# Patient Record
Sex: Female | Born: 1935 | Race: White | Hispanic: No | Smoking: Former smoker
Health system: Southern US, Community
[De-identification: ages and names within clinical notes are randomized; demographics above are authoritative.]

## PROBLEM LIST (undated history)

## (undated) DIAGNOSIS — E7800 Pure hypercholesterolemia, unspecified: Secondary | ICD-10-CM

## (undated) DIAGNOSIS — M810 Age-related osteoporosis without current pathological fracture: Secondary | ICD-10-CM

## (undated) DIAGNOSIS — J449 Chronic obstructive pulmonary disease, unspecified: Secondary | ICD-10-CM

## (undated) DIAGNOSIS — G250 Essential tremor: Secondary | ICD-10-CM

## (undated) DIAGNOSIS — I2510 Atherosclerotic heart disease of native coronary artery without angina pectoris: Secondary | ICD-10-CM

## (undated) DIAGNOSIS — N200 Calculus of kidney: Secondary | ICD-10-CM

## (undated) DIAGNOSIS — G459 Transient cerebral ischemic attack, unspecified: Secondary | ICD-10-CM

## (undated) DIAGNOSIS — E876 Hypokalemia: Secondary | ICD-10-CM

## (undated) DIAGNOSIS — I480 Paroxysmal atrial fibrillation: Secondary | ICD-10-CM

## (undated) DIAGNOSIS — R5381 Other malaise: Secondary | ICD-10-CM

## (undated) DIAGNOSIS — I10 Essential (primary) hypertension: Secondary | ICD-10-CM

## (undated) DIAGNOSIS — K118 Other diseases of salivary glands: Secondary | ICD-10-CM

## (undated) DIAGNOSIS — K760 Fatty (change of) liver, not elsewhere classified: Secondary | ICD-10-CM

## (undated) DIAGNOSIS — I5030 Unspecified diastolic (congestive) heart failure: Secondary | ICD-10-CM

## (undated) DIAGNOSIS — G4700 Insomnia, unspecified: Secondary | ICD-10-CM

## (undated) DIAGNOSIS — M109 Gout, unspecified: Secondary | ICD-10-CM

## (undated) DIAGNOSIS — K219 Gastro-esophageal reflux disease without esophagitis: Secondary | ICD-10-CM

## (undated) DIAGNOSIS — S32000A Wedge compression fracture of unspecified lumbar vertebra, initial encounter for closed fracture: Secondary | ICD-10-CM

## (undated) DIAGNOSIS — K5790 Diverticulosis of intestine, part unspecified, without perforation or abscess without bleeding: Secondary | ICD-10-CM

## (undated) DIAGNOSIS — J45909 Unspecified asthma, uncomplicated: Secondary | ICD-10-CM

## (undated) HISTORY — PX: CATARACT EXTRACTION: SUR2

## (undated) HISTORY — DX: Gout, unspecified: M10.9

## (undated) HISTORY — DX: Insomnia, unspecified: G47.00

## (undated) HISTORY — DX: Calculus of kidney: N20.0

## (undated) HISTORY — DX: Other diseases of salivary glands: K11.8

## (undated) HISTORY — DX: Chronic obstructive pulmonary disease, unspecified: J44.9

## (undated) HISTORY — DX: Diverticulosis of intestine, part unspecified, without perforation or abscess without bleeding: K57.90

## (undated) HISTORY — DX: Transient cerebral ischemic attack, unspecified: G45.9

## (undated) HISTORY — DX: Paroxysmal atrial fibrillation: I48.0

## (undated) HISTORY — DX: Essential tremor: G25.0

## (undated) HISTORY — DX: Wedge compression fracture of unspecified lumbar vertebra, initial encounter for closed fracture: S32.000A

## (undated) HISTORY — DX: Pure hypercholesterolemia, unspecified: E78.00

## (undated) HISTORY — DX: Age-related osteoporosis without current pathological fracture: M81.0

## (undated) HISTORY — DX: Fatty (change of) liver, not elsewhere classified: K76.0

## (undated) HISTORY — DX: Unspecified asthma, uncomplicated: J45.909

## (undated) HISTORY — DX: Other malaise: R53.81

## (undated) HISTORY — DX: Hypokalemia: E87.6

## (undated) HISTORY — PX: OTHER SURGICAL HISTORY: SHX169

## (undated) HISTORY — DX: Atherosclerotic heart disease of native coronary artery without angina pectoris: I25.10

## (undated) HISTORY — PX: TONSILLECTOMY AND ADENOIDECTOMY: SUR1326

## (undated) HISTORY — DX: Essential (primary) hypertension: I10

## (undated) HISTORY — DX: Unspecified diastolic (congestive) heart failure: I50.30

---

## 2011-08-31 DIAGNOSIS — I10 Essential (primary) hypertension: Secondary | ICD-10-CM | POA: Diagnosis not present

## 2011-08-31 DIAGNOSIS — M1990 Unspecified osteoarthritis, unspecified site: Secondary | ICD-10-CM | POA: Diagnosis not present

## 2011-08-31 DIAGNOSIS — I2510 Atherosclerotic heart disease of native coronary artery without angina pectoris: Secondary | ICD-10-CM | POA: Diagnosis not present

## 2011-08-31 DIAGNOSIS — M109 Gout, unspecified: Secondary | ICD-10-CM | POA: Diagnosis not present

## 2011-08-31 DIAGNOSIS — E7800 Pure hypercholesterolemia, unspecified: Secondary | ICD-10-CM | POA: Diagnosis not present

## 2011-09-27 DIAGNOSIS — I2510 Atherosclerotic heart disease of native coronary artery without angina pectoris: Secondary | ICD-10-CM | POA: Diagnosis not present

## 2011-09-27 DIAGNOSIS — E785 Hyperlipidemia, unspecified: Secondary | ICD-10-CM | POA: Diagnosis not present

## 2011-11-07 DIAGNOSIS — R319 Hematuria, unspecified: Secondary | ICD-10-CM | POA: Diagnosis not present

## 2011-11-15 DIAGNOSIS — N200 Calculus of kidney: Secondary | ICD-10-CM | POA: Diagnosis not present

## 2012-03-04 DIAGNOSIS — I2510 Atherosclerotic heart disease of native coronary artery without angina pectoris: Secondary | ICD-10-CM | POA: Diagnosis not present

## 2012-03-04 DIAGNOSIS — E7800 Pure hypercholesterolemia, unspecified: Secondary | ICD-10-CM | POA: Diagnosis not present

## 2012-03-04 DIAGNOSIS — I10 Essential (primary) hypertension: Secondary | ICD-10-CM | POA: Diagnosis not present

## 2012-03-22 DIAGNOSIS — Z1231 Encounter for screening mammogram for malignant neoplasm of breast: Secondary | ICD-10-CM | POA: Diagnosis not present

## 2012-09-03 DIAGNOSIS — M810 Age-related osteoporosis without current pathological fracture: Secondary | ICD-10-CM | POA: Diagnosis not present

## 2012-09-03 DIAGNOSIS — Z1331 Encounter for screening for depression: Secondary | ICD-10-CM | POA: Diagnosis not present

## 2012-09-03 DIAGNOSIS — R5381 Other malaise: Secondary | ICD-10-CM | POA: Diagnosis not present

## 2012-09-03 DIAGNOSIS — Z9181 History of falling: Secondary | ICD-10-CM | POA: Diagnosis not present

## 2012-09-03 DIAGNOSIS — I10 Essential (primary) hypertension: Secondary | ICD-10-CM | POA: Diagnosis not present

## 2012-09-03 DIAGNOSIS — I2510 Atherosclerotic heart disease of native coronary artery without angina pectoris: Secondary | ICD-10-CM | POA: Diagnosis not present

## 2012-09-03 DIAGNOSIS — E7800 Pure hypercholesterolemia, unspecified: Secondary | ICD-10-CM | POA: Diagnosis not present

## 2012-10-16 DIAGNOSIS — Z79899 Other long term (current) drug therapy: Secondary | ICD-10-CM | POA: Diagnosis not present

## 2012-10-16 DIAGNOSIS — I2510 Atherosclerotic heart disease of native coronary artery without angina pectoris: Secondary | ICD-10-CM | POA: Diagnosis not present

## 2012-10-16 DIAGNOSIS — E785 Hyperlipidemia, unspecified: Secondary | ICD-10-CM | POA: Diagnosis not present

## 2012-11-15 DIAGNOSIS — N200 Calculus of kidney: Secondary | ICD-10-CM | POA: Diagnosis not present

## 2012-11-15 DIAGNOSIS — R1032 Left lower quadrant pain: Secondary | ICD-10-CM | POA: Diagnosis not present

## 2013-01-21 DIAGNOSIS — R1032 Left lower quadrant pain: Secondary | ICD-10-CM | POA: Diagnosis not present

## 2013-03-05 DIAGNOSIS — I2510 Atherosclerotic heart disease of native coronary artery without angina pectoris: Secondary | ICD-10-CM | POA: Diagnosis not present

## 2013-03-05 DIAGNOSIS — M810 Age-related osteoporosis without current pathological fracture: Secondary | ICD-10-CM | POA: Diagnosis not present

## 2013-03-05 DIAGNOSIS — Z0000 Encounter for general adult medical examination without abnormal findings: Secondary | ICD-10-CM | POA: Diagnosis not present

## 2013-03-05 DIAGNOSIS — I10 Essential (primary) hypertension: Secondary | ICD-10-CM | POA: Diagnosis not present

## 2013-03-05 DIAGNOSIS — Z79899 Other long term (current) drug therapy: Secondary | ICD-10-CM | POA: Diagnosis not present

## 2013-03-05 DIAGNOSIS — E7800 Pure hypercholesterolemia, unspecified: Secondary | ICD-10-CM | POA: Diagnosis not present

## 2013-03-28 DIAGNOSIS — K7689 Other specified diseases of liver: Secondary | ICD-10-CM | POA: Diagnosis not present

## 2013-03-28 DIAGNOSIS — Z1231 Encounter for screening mammogram for malignant neoplasm of breast: Secondary | ICD-10-CM | POA: Diagnosis not present

## 2013-03-28 DIAGNOSIS — R1032 Left lower quadrant pain: Secondary | ICD-10-CM | POA: Diagnosis not present

## 2013-05-23 DIAGNOSIS — K5289 Other specified noninfective gastroenteritis and colitis: Secondary | ICD-10-CM | POA: Diagnosis not present

## 2013-05-23 DIAGNOSIS — N289 Disorder of kidney and ureter, unspecified: Secondary | ICD-10-CM | POA: Diagnosis not present

## 2013-05-23 DIAGNOSIS — IMO0002 Reserved for concepts with insufficient information to code with codable children: Secondary | ICD-10-CM | POA: Diagnosis not present

## 2013-06-17 DIAGNOSIS — M48061 Spinal stenosis, lumbar region without neurogenic claudication: Secondary | ICD-10-CM | POA: Diagnosis not present

## 2013-06-17 DIAGNOSIS — M5137 Other intervertebral disc degeneration, lumbosacral region: Secondary | ICD-10-CM | POA: Diagnosis not present

## 2013-06-17 DIAGNOSIS — K7689 Other specified diseases of liver: Secondary | ICD-10-CM | POA: Diagnosis not present

## 2013-06-17 DIAGNOSIS — M5126 Other intervertebral disc displacement, lumbar region: Secondary | ICD-10-CM | POA: Diagnosis not present

## 2013-06-17 DIAGNOSIS — IMO0002 Reserved for concepts with insufficient information to code with codable children: Secondary | ICD-10-CM | POA: Diagnosis not present

## 2013-06-17 DIAGNOSIS — N289 Disorder of kidney and ureter, unspecified: Secondary | ICD-10-CM | POA: Diagnosis not present

## 2013-06-17 DIAGNOSIS — M47817 Spondylosis without myelopathy or radiculopathy, lumbosacral region: Secondary | ICD-10-CM | POA: Diagnosis not present

## 2013-07-10 DIAGNOSIS — N289 Disorder of kidney and ureter, unspecified: Secondary | ICD-10-CM | POA: Diagnosis not present

## 2013-07-10 DIAGNOSIS — E7800 Pure hypercholesterolemia, unspecified: Secondary | ICD-10-CM | POA: Diagnosis not present

## 2013-07-10 DIAGNOSIS — I2510 Atherosclerotic heart disease of native coronary artery without angina pectoris: Secondary | ICD-10-CM | POA: Diagnosis not present

## 2013-07-10 DIAGNOSIS — Z79899 Other long term (current) drug therapy: Secondary | ICD-10-CM | POA: Diagnosis not present

## 2013-07-10 DIAGNOSIS — I10 Essential (primary) hypertension: Secondary | ICD-10-CM | POA: Diagnosis not present

## 2013-07-10 DIAGNOSIS — IMO0002 Reserved for concepts with insufficient information to code with codable children: Secondary | ICD-10-CM | POA: Diagnosis not present

## 2013-07-14 DIAGNOSIS — IMO0002 Reserved for concepts with insufficient information to code with codable children: Secondary | ICD-10-CM | POA: Diagnosis not present

## 2013-07-14 DIAGNOSIS — M999 Biomechanical lesion, unspecified: Secondary | ICD-10-CM | POA: Diagnosis not present

## 2013-07-17 DIAGNOSIS — IMO0002 Reserved for concepts with insufficient information to code with codable children: Secondary | ICD-10-CM | POA: Diagnosis not present

## 2013-07-17 DIAGNOSIS — M999 Biomechanical lesion, unspecified: Secondary | ICD-10-CM | POA: Diagnosis not present

## 2013-07-22 DIAGNOSIS — M999 Biomechanical lesion, unspecified: Secondary | ICD-10-CM | POA: Diagnosis not present

## 2013-07-22 DIAGNOSIS — IMO0002 Reserved for concepts with insufficient information to code with codable children: Secondary | ICD-10-CM | POA: Diagnosis not present

## 2013-07-24 DIAGNOSIS — M999 Biomechanical lesion, unspecified: Secondary | ICD-10-CM | POA: Diagnosis not present

## 2013-07-24 DIAGNOSIS — IMO0002 Reserved for concepts with insufficient information to code with codable children: Secondary | ICD-10-CM | POA: Diagnosis not present

## 2013-07-25 DIAGNOSIS — H2510 Age-related nuclear cataract, unspecified eye: Secondary | ICD-10-CM | POA: Diagnosis not present

## 2013-07-29 DIAGNOSIS — IMO0002 Reserved for concepts with insufficient information to code with codable children: Secondary | ICD-10-CM | POA: Diagnosis not present

## 2013-07-29 DIAGNOSIS — M999 Biomechanical lesion, unspecified: Secondary | ICD-10-CM | POA: Diagnosis not present

## 2013-07-31 DIAGNOSIS — IMO0002 Reserved for concepts with insufficient information to code with codable children: Secondary | ICD-10-CM | POA: Diagnosis not present

## 2013-07-31 DIAGNOSIS — M999 Biomechanical lesion, unspecified: Secondary | ICD-10-CM | POA: Diagnosis not present

## 2013-08-05 DIAGNOSIS — IMO0002 Reserved for concepts with insufficient information to code with codable children: Secondary | ICD-10-CM | POA: Diagnosis not present

## 2013-08-05 DIAGNOSIS — M999 Biomechanical lesion, unspecified: Secondary | ICD-10-CM | POA: Diagnosis not present

## 2013-08-07 DIAGNOSIS — IMO0002 Reserved for concepts with insufficient information to code with codable children: Secondary | ICD-10-CM | POA: Diagnosis not present

## 2013-08-07 DIAGNOSIS — M999 Biomechanical lesion, unspecified: Secondary | ICD-10-CM | POA: Diagnosis not present

## 2013-08-11 DIAGNOSIS — IMO0002 Reserved for concepts with insufficient information to code with codable children: Secondary | ICD-10-CM | POA: Diagnosis not present

## 2013-08-11 DIAGNOSIS — M999 Biomechanical lesion, unspecified: Secondary | ICD-10-CM | POA: Diagnosis not present

## 2013-08-14 DIAGNOSIS — M999 Biomechanical lesion, unspecified: Secondary | ICD-10-CM | POA: Diagnosis not present

## 2013-08-14 DIAGNOSIS — IMO0002 Reserved for concepts with insufficient information to code with codable children: Secondary | ICD-10-CM | POA: Diagnosis not present

## 2013-08-19 DIAGNOSIS — M999 Biomechanical lesion, unspecified: Secondary | ICD-10-CM | POA: Diagnosis not present

## 2013-08-19 DIAGNOSIS — IMO0002 Reserved for concepts with insufficient information to code with codable children: Secondary | ICD-10-CM | POA: Diagnosis not present

## 2013-08-21 DIAGNOSIS — IMO0002 Reserved for concepts with insufficient information to code with codable children: Secondary | ICD-10-CM | POA: Diagnosis not present

## 2013-08-21 DIAGNOSIS — M999 Biomechanical lesion, unspecified: Secondary | ICD-10-CM | POA: Diagnosis not present

## 2013-08-26 DIAGNOSIS — IMO0002 Reserved for concepts with insufficient information to code with codable children: Secondary | ICD-10-CM | POA: Diagnosis not present

## 2013-08-26 DIAGNOSIS — M999 Biomechanical lesion, unspecified: Secondary | ICD-10-CM | POA: Diagnosis not present

## 2013-08-28 DIAGNOSIS — M999 Biomechanical lesion, unspecified: Secondary | ICD-10-CM | POA: Diagnosis not present

## 2013-08-28 DIAGNOSIS — IMO0002 Reserved for concepts with insufficient information to code with codable children: Secondary | ICD-10-CM | POA: Diagnosis not present

## 2013-09-03 DIAGNOSIS — R748 Abnormal levels of other serum enzymes: Secondary | ICD-10-CM | POA: Diagnosis not present

## 2013-09-03 DIAGNOSIS — N289 Disorder of kidney and ureter, unspecified: Secondary | ICD-10-CM | POA: Diagnosis not present

## 2013-09-03 DIAGNOSIS — I2510 Atherosclerotic heart disease of native coronary artery without angina pectoris: Secondary | ICD-10-CM | POA: Diagnosis not present

## 2013-09-03 DIAGNOSIS — IMO0002 Reserved for concepts with insufficient information to code with codable children: Secondary | ICD-10-CM | POA: Diagnosis not present

## 2013-09-23 DIAGNOSIS — M999 Biomechanical lesion, unspecified: Secondary | ICD-10-CM | POA: Diagnosis not present

## 2013-09-23 DIAGNOSIS — IMO0002 Reserved for concepts with insufficient information to code with codable children: Secondary | ICD-10-CM | POA: Diagnosis not present

## 2013-10-06 DIAGNOSIS — IMO0002 Reserved for concepts with insufficient information to code with codable children: Secondary | ICD-10-CM | POA: Diagnosis not present

## 2013-10-06 DIAGNOSIS — M999 Biomechanical lesion, unspecified: Secondary | ICD-10-CM | POA: Diagnosis not present

## 2013-10-16 DIAGNOSIS — M25579 Pain in unspecified ankle and joints of unspecified foot: Secondary | ICD-10-CM | POA: Diagnosis not present

## 2013-10-16 DIAGNOSIS — S8990XA Unspecified injury of unspecified lower leg, initial encounter: Secondary | ICD-10-CM | POA: Diagnosis not present

## 2013-10-16 DIAGNOSIS — M7730 Calcaneal spur, unspecified foot: Secondary | ICD-10-CM | POA: Diagnosis not present

## 2013-10-16 DIAGNOSIS — M19079 Primary osteoarthritis, unspecified ankle and foot: Secondary | ICD-10-CM | POA: Diagnosis not present

## 2013-10-16 DIAGNOSIS — Z87891 Personal history of nicotine dependence: Secondary | ICD-10-CM | POA: Diagnosis not present

## 2013-10-16 DIAGNOSIS — M7989 Other specified soft tissue disorders: Secondary | ICD-10-CM | POA: Diagnosis not present

## 2013-10-16 DIAGNOSIS — I10 Essential (primary) hypertension: Secondary | ICD-10-CM | POA: Diagnosis not present

## 2013-10-16 DIAGNOSIS — M2010 Hallux valgus (acquired), unspecified foot: Secondary | ICD-10-CM | POA: Diagnosis not present

## 2013-10-16 DIAGNOSIS — S99919A Unspecified injury of unspecified ankle, initial encounter: Secondary | ICD-10-CM | POA: Diagnosis not present

## 2013-10-16 DIAGNOSIS — S92309A Fracture of unspecified metatarsal bone(s), unspecified foot, initial encounter for closed fracture: Secondary | ICD-10-CM | POA: Diagnosis not present

## 2013-10-16 DIAGNOSIS — I2510 Atherosclerotic heart disease of native coronary artery without angina pectoris: Secondary | ICD-10-CM | POA: Diagnosis not present

## 2013-10-20 DIAGNOSIS — S92909A Unspecified fracture of unspecified foot, initial encounter for closed fracture: Secondary | ICD-10-CM | POA: Diagnosis not present

## 2013-10-22 DIAGNOSIS — S92309A Fracture of unspecified metatarsal bone(s), unspecified foot, initial encounter for closed fracture: Secondary | ICD-10-CM | POA: Diagnosis not present

## 2013-11-19 DIAGNOSIS — S92309A Fracture of unspecified metatarsal bone(s), unspecified foot, initial encounter for closed fracture: Secondary | ICD-10-CM | POA: Diagnosis not present

## 2013-12-31 DIAGNOSIS — S92335D Nondisplaced fracture of third metatarsal bone, left foot, subsequent encounter for fracture with routine healing: Secondary | ICD-10-CM | POA: Diagnosis not present

## 2013-12-31 DIAGNOSIS — M25571 Pain in right ankle and joints of right foot: Secondary | ICD-10-CM | POA: Diagnosis not present

## 2013-12-31 DIAGNOSIS — S92343D Displaced fracture of fourth metatarsal bone, unspecified foot, subsequent encounter for fracture with routine healing: Secondary | ICD-10-CM | POA: Diagnosis not present

## 2014-03-05 DIAGNOSIS — Z1389 Encounter for screening for other disorder: Secondary | ICD-10-CM | POA: Diagnosis not present

## 2014-03-05 DIAGNOSIS — Z9181 History of falling: Secondary | ICD-10-CM | POA: Diagnosis not present

## 2014-03-05 DIAGNOSIS — I2510 Atherosclerotic heart disease of native coronary artery without angina pectoris: Secondary | ICD-10-CM | POA: Diagnosis not present

## 2014-03-05 DIAGNOSIS — M109 Gout, unspecified: Secondary | ICD-10-CM | POA: Diagnosis not present

## 2014-03-05 DIAGNOSIS — E780 Pure hypercholesterolemia: Secondary | ICD-10-CM | POA: Diagnosis not present

## 2014-03-05 DIAGNOSIS — I10 Essential (primary) hypertension: Secondary | ICD-10-CM | POA: Diagnosis not present

## 2014-03-05 DIAGNOSIS — Z0000 Encounter for general adult medical examination without abnormal findings: Secondary | ICD-10-CM | POA: Diagnosis not present

## 2014-04-01 DIAGNOSIS — J189 Pneumonia, unspecified organism: Secondary | ICD-10-CM | POA: Diagnosis not present

## 2014-04-01 DIAGNOSIS — L989 Disorder of the skin and subcutaneous tissue, unspecified: Secondary | ICD-10-CM | POA: Diagnosis not present

## 2014-04-01 DIAGNOSIS — Z6830 Body mass index (BMI) 30.0-30.9, adult: Secondary | ICD-10-CM | POA: Diagnosis not present

## 2014-04-15 DIAGNOSIS — M810 Age-related osteoporosis without current pathological fracture: Secondary | ICD-10-CM | POA: Diagnosis not present

## 2014-04-15 DIAGNOSIS — Z13820 Encounter for screening for osteoporosis: Secondary | ICD-10-CM | POA: Diagnosis not present

## 2014-04-15 DIAGNOSIS — Z1231 Encounter for screening mammogram for malignant neoplasm of breast: Secondary | ICD-10-CM | POA: Diagnosis not present

## 2014-04-15 DIAGNOSIS — M8580 Other specified disorders of bone density and structure, unspecified site: Secondary | ICD-10-CM | POA: Diagnosis not present

## 2014-04-16 DIAGNOSIS — L814 Other melanin hyperpigmentation: Secondary | ICD-10-CM | POA: Diagnosis not present

## 2014-04-16 DIAGNOSIS — L570 Actinic keratosis: Secondary | ICD-10-CM | POA: Diagnosis not present

## 2014-04-16 DIAGNOSIS — L859 Epidermal thickening, unspecified: Secondary | ICD-10-CM | POA: Diagnosis not present

## 2014-04-16 DIAGNOSIS — L821 Other seborrheic keratosis: Secondary | ICD-10-CM | POA: Diagnosis not present

## 2014-05-04 DIAGNOSIS — J019 Acute sinusitis, unspecified: Secondary | ICD-10-CM | POA: Diagnosis not present

## 2014-08-04 DIAGNOSIS — I2510 Atherosclerotic heart disease of native coronary artery without angina pectoris: Secondary | ICD-10-CM | POA: Diagnosis not present

## 2014-08-12 DIAGNOSIS — Z1389 Encounter for screening for other disorder: Secondary | ICD-10-CM | POA: Diagnosis not present

## 2014-08-12 DIAGNOSIS — I10 Essential (primary) hypertension: Secondary | ICD-10-CM | POA: Diagnosis not present

## 2014-08-12 DIAGNOSIS — J449 Chronic obstructive pulmonary disease, unspecified: Secondary | ICD-10-CM | POA: Diagnosis not present

## 2014-08-12 DIAGNOSIS — Z6829 Body mass index (BMI) 29.0-29.9, adult: Secondary | ICD-10-CM | POA: Diagnosis not present

## 2014-08-12 DIAGNOSIS — R1084 Generalized abdominal pain: Secondary | ICD-10-CM | POA: Diagnosis not present

## 2014-08-12 DIAGNOSIS — I2510 Atherosclerotic heart disease of native coronary artery without angina pectoris: Secondary | ICD-10-CM | POA: Diagnosis not present

## 2014-10-13 DIAGNOSIS — M79671 Pain in right foot: Secondary | ICD-10-CM | POA: Diagnosis not present

## 2014-10-13 DIAGNOSIS — Z6829 Body mass index (BMI) 29.0-29.9, adult: Secondary | ICD-10-CM | POA: Diagnosis not present

## 2014-10-13 DIAGNOSIS — I10 Essential (primary) hypertension: Secondary | ICD-10-CM | POA: Diagnosis not present

## 2014-10-13 DIAGNOSIS — E780 Pure hypercholesterolemia: Secondary | ICD-10-CM | POA: Diagnosis not present

## 2014-10-13 DIAGNOSIS — I2510 Atherosclerotic heart disease of native coronary artery without angina pectoris: Secondary | ICD-10-CM | POA: Diagnosis not present

## 2014-10-13 DIAGNOSIS — J449 Chronic obstructive pulmonary disease, unspecified: Secondary | ICD-10-CM | POA: Diagnosis not present

## 2014-10-14 DIAGNOSIS — M79671 Pain in right foot: Secondary | ICD-10-CM | POA: Diagnosis not present

## 2014-10-14 DIAGNOSIS — S92331D Displaced fracture of third metatarsal bone, right foot, subsequent encounter for fracture with routine healing: Secondary | ICD-10-CM | POA: Diagnosis not present

## 2014-10-14 DIAGNOSIS — M25571 Pain in right ankle and joints of right foot: Secondary | ICD-10-CM | POA: Diagnosis not present

## 2014-10-14 DIAGNOSIS — S92341D Displaced fracture of fourth metatarsal bone, right foot, subsequent encounter for fracture with routine healing: Secondary | ICD-10-CM | POA: Diagnosis not present

## 2014-12-09 DIAGNOSIS — N393 Stress incontinence (female) (male): Secondary | ICD-10-CM | POA: Diagnosis not present

## 2014-12-09 DIAGNOSIS — R300 Dysuria: Secondary | ICD-10-CM | POA: Diagnosis not present

## 2014-12-09 DIAGNOSIS — Z6830 Body mass index (BMI) 30.0-30.9, adult: Secondary | ICD-10-CM | POA: Diagnosis not present

## 2014-12-09 DIAGNOSIS — M545 Low back pain: Secondary | ICD-10-CM | POA: Diagnosis not present

## 2014-12-23 DIAGNOSIS — J441 Chronic obstructive pulmonary disease with (acute) exacerbation: Secondary | ICD-10-CM | POA: Diagnosis not present

## 2014-12-23 DIAGNOSIS — I10 Essential (primary) hypertension: Secondary | ICD-10-CM | POA: Diagnosis not present

## 2014-12-23 DIAGNOSIS — R609 Edema, unspecified: Secondary | ICD-10-CM | POA: Diagnosis not present

## 2014-12-23 DIAGNOSIS — Z6830 Body mass index (BMI) 30.0-30.9, adult: Secondary | ICD-10-CM | POA: Diagnosis not present

## 2015-01-25 DIAGNOSIS — J441 Chronic obstructive pulmonary disease with (acute) exacerbation: Secondary | ICD-10-CM | POA: Diagnosis not present

## 2015-01-25 DIAGNOSIS — Z1389 Encounter for screening for other disorder: Secondary | ICD-10-CM | POA: Diagnosis not present

## 2015-01-25 DIAGNOSIS — Z6832 Body mass index (BMI) 32.0-32.9, adult: Secondary | ICD-10-CM | POA: Diagnosis not present

## 2015-04-06 DIAGNOSIS — I10 Essential (primary) hypertension: Secondary | ICD-10-CM | POA: Diagnosis not present

## 2015-04-06 DIAGNOSIS — Z6832 Body mass index (BMI) 32.0-32.9, adult: Secondary | ICD-10-CM | POA: Diagnosis not present

## 2015-04-06 DIAGNOSIS — J449 Chronic obstructive pulmonary disease, unspecified: Secondary | ICD-10-CM | POA: Diagnosis not present

## 2015-04-06 DIAGNOSIS — I2510 Atherosclerotic heart disease of native coronary artery without angina pectoris: Secondary | ICD-10-CM | POA: Diagnosis not present

## 2015-04-06 DIAGNOSIS — G250 Essential tremor: Secondary | ICD-10-CM | POA: Diagnosis not present

## 2015-04-06 DIAGNOSIS — Z9181 History of falling: Secondary | ICD-10-CM | POA: Diagnosis not present

## 2015-04-06 DIAGNOSIS — E669 Obesity, unspecified: Secondary | ICD-10-CM | POA: Diagnosis not present

## 2015-05-07 DIAGNOSIS — G250 Essential tremor: Secondary | ICD-10-CM | POA: Diagnosis not present

## 2015-05-07 DIAGNOSIS — J449 Chronic obstructive pulmonary disease, unspecified: Secondary | ICD-10-CM | POA: Diagnosis not present

## 2015-05-07 DIAGNOSIS — Z6832 Body mass index (BMI) 32.0-32.9, adult: Secondary | ICD-10-CM | POA: Diagnosis not present

## 2015-05-26 DIAGNOSIS — Z6832 Body mass index (BMI) 32.0-32.9, adult: Secondary | ICD-10-CM | POA: Diagnosis not present

## 2015-05-26 DIAGNOSIS — J441 Chronic obstructive pulmonary disease with (acute) exacerbation: Secondary | ICD-10-CM | POA: Diagnosis not present

## 2015-06-16 DIAGNOSIS — K219 Gastro-esophageal reflux disease without esophagitis: Secondary | ICD-10-CM | POA: Diagnosis not present

## 2015-06-16 DIAGNOSIS — I10 Essential (primary) hypertension: Secondary | ICD-10-CM | POA: Diagnosis not present

## 2015-06-16 DIAGNOSIS — Z6832 Body mass index (BMI) 32.0-32.9, adult: Secondary | ICD-10-CM | POA: Diagnosis not present

## 2015-06-16 DIAGNOSIS — K529 Noninfective gastroenteritis and colitis, unspecified: Secondary | ICD-10-CM | POA: Diagnosis not present

## 2015-08-04 DIAGNOSIS — I10 Essential (primary) hypertension: Secondary | ICD-10-CM | POA: Diagnosis not present

## 2015-08-04 DIAGNOSIS — Z79899 Other long term (current) drug therapy: Secondary | ICD-10-CM | POA: Diagnosis not present

## 2015-08-04 DIAGNOSIS — Z6836 Body mass index (BMI) 36.0-36.9, adult: Secondary | ICD-10-CM | POA: Diagnosis not present

## 2015-08-04 DIAGNOSIS — L989 Disorder of the skin and subcutaneous tissue, unspecified: Secondary | ICD-10-CM | POA: Diagnosis not present

## 2015-08-04 DIAGNOSIS — J449 Chronic obstructive pulmonary disease, unspecified: Secondary | ICD-10-CM | POA: Diagnosis not present

## 2015-08-04 DIAGNOSIS — I2510 Atherosclerotic heart disease of native coronary artery without angina pectoris: Secondary | ICD-10-CM | POA: Diagnosis not present

## 2015-08-04 DIAGNOSIS — K219 Gastro-esophageal reflux disease without esophagitis: Secondary | ICD-10-CM | POA: Diagnosis not present

## 2015-08-10 DIAGNOSIS — L570 Actinic keratosis: Secondary | ICD-10-CM | POA: Diagnosis not present

## 2015-08-31 DIAGNOSIS — R002 Palpitations: Secondary | ICD-10-CM | POA: Diagnosis not present

## 2015-08-31 DIAGNOSIS — I4891 Unspecified atrial fibrillation: Secondary | ICD-10-CM | POA: Diagnosis not present

## 2015-08-31 DIAGNOSIS — I2510 Atherosclerotic heart disease of native coronary artery without angina pectoris: Secondary | ICD-10-CM | POA: Diagnosis not present

## 2015-08-31 DIAGNOSIS — I10 Essential (primary) hypertension: Secondary | ICD-10-CM | POA: Diagnosis not present

## 2015-08-31 DIAGNOSIS — I25110 Atherosclerotic heart disease of native coronary artery with unstable angina pectoris: Secondary | ICD-10-CM | POA: Diagnosis not present

## 2015-09-07 DIAGNOSIS — I2510 Atherosclerotic heart disease of native coronary artery without angina pectoris: Secondary | ICD-10-CM | POA: Diagnosis not present

## 2015-09-07 DIAGNOSIS — R002 Palpitations: Secondary | ICD-10-CM | POA: Diagnosis not present

## 2015-09-15 DIAGNOSIS — R002 Palpitations: Secondary | ICD-10-CM | POA: Diagnosis not present

## 2015-09-15 DIAGNOSIS — I2510 Atherosclerotic heart disease of native coronary artery without angina pectoris: Secondary | ICD-10-CM | POA: Diagnosis not present

## 2015-09-15 DIAGNOSIS — I4891 Unspecified atrial fibrillation: Secondary | ICD-10-CM | POA: Diagnosis not present

## 2015-09-15 DIAGNOSIS — I10 Essential (primary) hypertension: Secondary | ICD-10-CM | POA: Diagnosis not present

## 2015-09-15 DIAGNOSIS — E785 Hyperlipidemia, unspecified: Secondary | ICD-10-CM | POA: Diagnosis not present

## 2015-09-15 DIAGNOSIS — I25110 Atherosclerotic heart disease of native coronary artery with unstable angina pectoris: Secondary | ICD-10-CM | POA: Diagnosis not present

## 2015-09-28 DIAGNOSIS — R945 Abnormal results of liver function studies: Secondary | ICD-10-CM | POA: Diagnosis not present

## 2015-09-28 DIAGNOSIS — I10 Essential (primary) hypertension: Secondary | ICD-10-CM | POA: Diagnosis not present

## 2015-09-28 DIAGNOSIS — R932 Abnormal findings on diagnostic imaging of liver and biliary tract: Secondary | ICD-10-CM | POA: Diagnosis not present

## 2015-09-28 DIAGNOSIS — I2510 Atherosclerotic heart disease of native coronary artery without angina pectoris: Secondary | ICD-10-CM | POA: Diagnosis not present

## 2015-09-28 DIAGNOSIS — E785 Hyperlipidemia, unspecified: Secondary | ICD-10-CM | POA: Diagnosis not present

## 2015-09-28 DIAGNOSIS — K760 Fatty (change of) liver, not elsewhere classified: Secondary | ICD-10-CM | POA: Diagnosis not present

## 2015-10-12 DIAGNOSIS — K7581 Nonalcoholic steatohepatitis (NASH): Secondary | ICD-10-CM | POA: Diagnosis not present

## 2015-10-12 DIAGNOSIS — R945 Abnormal results of liver function studies: Secondary | ICD-10-CM | POA: Diagnosis not present

## 2015-11-03 DIAGNOSIS — R945 Abnormal results of liver function studies: Secondary | ICD-10-CM | POA: Diagnosis not present

## 2015-11-03 DIAGNOSIS — I25110 Atherosclerotic heart disease of native coronary artery with unstable angina pectoris: Secondary | ICD-10-CM | POA: Diagnosis not present

## 2015-11-03 DIAGNOSIS — R002 Palpitations: Secondary | ICD-10-CM | POA: Diagnosis not present

## 2015-11-03 DIAGNOSIS — I4891 Unspecified atrial fibrillation: Secondary | ICD-10-CM | POA: Diagnosis not present

## 2015-11-03 DIAGNOSIS — I2510 Atherosclerotic heart disease of native coronary artery without angina pectoris: Secondary | ICD-10-CM | POA: Diagnosis not present

## 2015-11-03 DIAGNOSIS — R079 Chest pain, unspecified: Secondary | ICD-10-CM | POA: Diagnosis not present

## 2015-11-03 DIAGNOSIS — K7581 Nonalcoholic steatohepatitis (NASH): Secondary | ICD-10-CM | POA: Diagnosis not present

## 2015-11-03 DIAGNOSIS — I10 Essential (primary) hypertension: Secondary | ICD-10-CM | POA: Diagnosis not present

## 2015-11-03 DIAGNOSIS — E785 Hyperlipidemia, unspecified: Secondary | ICD-10-CM | POA: Diagnosis not present

## 2015-12-06 DIAGNOSIS — Z79899 Other long term (current) drug therapy: Secondary | ICD-10-CM | POA: Diagnosis not present

## 2015-12-06 DIAGNOSIS — I480 Paroxysmal atrial fibrillation: Secondary | ICD-10-CM | POA: Diagnosis not present

## 2015-12-06 DIAGNOSIS — K219 Gastro-esophageal reflux disease without esophagitis: Secondary | ICD-10-CM | POA: Diagnosis not present

## 2015-12-06 DIAGNOSIS — I10 Essential (primary) hypertension: Secondary | ICD-10-CM | POA: Diagnosis not present

## 2015-12-06 DIAGNOSIS — K760 Fatty (change of) liver, not elsewhere classified: Secondary | ICD-10-CM | POA: Diagnosis not present

## 2015-12-06 DIAGNOSIS — J449 Chronic obstructive pulmonary disease, unspecified: Secondary | ICD-10-CM | POA: Diagnosis not present

## 2015-12-06 DIAGNOSIS — I2510 Atherosclerotic heart disease of native coronary artery without angina pectoris: Secondary | ICD-10-CM | POA: Diagnosis not present

## 2015-12-06 DIAGNOSIS — Z6836 Body mass index (BMI) 36.0-36.9, adult: Secondary | ICD-10-CM | POA: Diagnosis not present

## 2016-02-21 DIAGNOSIS — J019 Acute sinusitis, unspecified: Secondary | ICD-10-CM | POA: Diagnosis not present

## 2016-02-21 DIAGNOSIS — J441 Chronic obstructive pulmonary disease with (acute) exacerbation: Secondary | ICD-10-CM | POA: Diagnosis not present

## 2016-02-21 DIAGNOSIS — Z6836 Body mass index (BMI) 36.0-36.9, adult: Secondary | ICD-10-CM | POA: Diagnosis not present

## 2016-04-10 DIAGNOSIS — E7800 Pure hypercholesterolemia, unspecified: Secondary | ICD-10-CM | POA: Diagnosis not present

## 2016-04-10 DIAGNOSIS — K760 Fatty (change of) liver, not elsewhere classified: Secondary | ICD-10-CM | POA: Diagnosis not present

## 2016-04-10 DIAGNOSIS — J449 Chronic obstructive pulmonary disease, unspecified: Secondary | ICD-10-CM | POA: Diagnosis not present

## 2016-04-10 DIAGNOSIS — Z6834 Body mass index (BMI) 34.0-34.9, adult: Secondary | ICD-10-CM | POA: Diagnosis not present

## 2016-04-10 DIAGNOSIS — Z79899 Other long term (current) drug therapy: Secondary | ICD-10-CM | POA: Diagnosis not present

## 2016-04-10 DIAGNOSIS — I480 Paroxysmal atrial fibrillation: Secondary | ICD-10-CM | POA: Diagnosis not present

## 2016-04-10 DIAGNOSIS — I2510 Atherosclerotic heart disease of native coronary artery without angina pectoris: Secondary | ICD-10-CM | POA: Diagnosis not present

## 2016-04-10 DIAGNOSIS — I10 Essential (primary) hypertension: Secondary | ICD-10-CM | POA: Diagnosis not present

## 2016-04-10 DIAGNOSIS — K219 Gastro-esophageal reflux disease without esophagitis: Secondary | ICD-10-CM | POA: Diagnosis not present

## 2016-04-11 DIAGNOSIS — Z79899 Other long term (current) drug therapy: Secondary | ICD-10-CM | POA: Diagnosis not present

## 2016-04-11 DIAGNOSIS — K760 Fatty (change of) liver, not elsewhere classified: Secondary | ICD-10-CM | POA: Diagnosis not present

## 2016-04-11 DIAGNOSIS — E7800 Pure hypercholesterolemia, unspecified: Secondary | ICD-10-CM | POA: Diagnosis not present

## 2016-05-09 DIAGNOSIS — I10 Essential (primary) hypertension: Secondary | ICD-10-CM | POA: Diagnosis not present

## 2016-05-09 DIAGNOSIS — I2510 Atherosclerotic heart disease of native coronary artery without angina pectoris: Secondary | ICD-10-CM | POA: Diagnosis not present

## 2016-05-09 DIAGNOSIS — I4891 Unspecified atrial fibrillation: Secondary | ICD-10-CM | POA: Diagnosis not present

## 2016-05-31 DIAGNOSIS — Z6835 Body mass index (BMI) 35.0-35.9, adult: Secondary | ICD-10-CM | POA: Diagnosis not present

## 2016-05-31 DIAGNOSIS — J019 Acute sinusitis, unspecified: Secondary | ICD-10-CM | POA: Diagnosis not present

## 2016-05-31 DIAGNOSIS — J449 Chronic obstructive pulmonary disease, unspecified: Secondary | ICD-10-CM | POA: Diagnosis not present

## 2016-05-31 DIAGNOSIS — M810 Age-related osteoporosis without current pathological fracture: Secondary | ICD-10-CM | POA: Diagnosis not present

## 2016-09-29 DIAGNOSIS — N959 Unspecified menopausal and perimenopausal disorder: Secondary | ICD-10-CM | POA: Diagnosis not present

## 2016-09-29 DIAGNOSIS — Z1389 Encounter for screening for other disorder: Secondary | ICD-10-CM | POA: Diagnosis not present

## 2016-09-29 DIAGNOSIS — Z9181 History of falling: Secondary | ICD-10-CM | POA: Diagnosis not present

## 2016-09-29 DIAGNOSIS — Z1231 Encounter for screening mammogram for malignant neoplasm of breast: Secondary | ICD-10-CM | POA: Diagnosis not present

## 2016-09-29 DIAGNOSIS — E785 Hyperlipidemia, unspecified: Secondary | ICD-10-CM | POA: Diagnosis not present

## 2016-09-29 DIAGNOSIS — Z136 Encounter for screening for cardiovascular disorders: Secondary | ICD-10-CM | POA: Diagnosis not present

## 2016-09-29 DIAGNOSIS — Z0000 Encounter for general adult medical examination without abnormal findings: Secondary | ICD-10-CM | POA: Diagnosis not present

## 2016-09-29 DIAGNOSIS — Z6834 Body mass index (BMI) 34.0-34.9, adult: Secondary | ICD-10-CM | POA: Diagnosis not present

## 2016-10-09 DIAGNOSIS — M810 Age-related osteoporosis without current pathological fracture: Secondary | ICD-10-CM | POA: Diagnosis not present

## 2016-10-09 DIAGNOSIS — I2510 Atherosclerotic heart disease of native coronary artery without angina pectoris: Secondary | ICD-10-CM | POA: Diagnosis not present

## 2016-10-09 DIAGNOSIS — L989 Disorder of the skin and subcutaneous tissue, unspecified: Secondary | ICD-10-CM | POA: Diagnosis not present

## 2016-10-09 DIAGNOSIS — K219 Gastro-esophageal reflux disease without esophagitis: Secondary | ICD-10-CM | POA: Diagnosis not present

## 2016-10-09 DIAGNOSIS — Z79899 Other long term (current) drug therapy: Secondary | ICD-10-CM | POA: Diagnosis not present

## 2016-10-09 DIAGNOSIS — N390 Urinary tract infection, site not specified: Secondary | ICD-10-CM | POA: Diagnosis not present

## 2016-10-09 DIAGNOSIS — I480 Paroxysmal atrial fibrillation: Secondary | ICD-10-CM | POA: Diagnosis not present

## 2016-10-09 DIAGNOSIS — J449 Chronic obstructive pulmonary disease, unspecified: Secondary | ICD-10-CM | POA: Diagnosis not present

## 2016-10-09 DIAGNOSIS — Z6834 Body mass index (BMI) 34.0-34.9, adult: Secondary | ICD-10-CM | POA: Diagnosis not present

## 2016-10-09 DIAGNOSIS — R609 Edema, unspecified: Secondary | ICD-10-CM | POA: Diagnosis not present

## 2016-10-19 DIAGNOSIS — Z1231 Encounter for screening mammogram for malignant neoplasm of breast: Secondary | ICD-10-CM | POA: Diagnosis not present

## 2016-10-19 DIAGNOSIS — M810 Age-related osteoporosis without current pathological fracture: Secondary | ICD-10-CM | POA: Diagnosis not present

## 2016-10-19 DIAGNOSIS — N959 Unspecified menopausal and perimenopausal disorder: Secondary | ICD-10-CM | POA: Diagnosis not present

## 2016-11-22 DIAGNOSIS — N390 Urinary tract infection, site not specified: Secondary | ICD-10-CM | POA: Diagnosis not present

## 2017-03-02 DIAGNOSIS — E785 Hyperlipidemia, unspecified: Secondary | ICD-10-CM | POA: Diagnosis not present

## 2017-03-02 DIAGNOSIS — I2510 Atherosclerotic heart disease of native coronary artery without angina pectoris: Secondary | ICD-10-CM | POA: Diagnosis not present

## 2017-03-02 DIAGNOSIS — I10 Essential (primary) hypertension: Secondary | ICD-10-CM | POA: Diagnosis not present

## 2017-03-07 DIAGNOSIS — N390 Urinary tract infection, site not specified: Secondary | ICD-10-CM | POA: Diagnosis not present

## 2017-03-07 DIAGNOSIS — Z6834 Body mass index (BMI) 34.0-34.9, adult: Secondary | ICD-10-CM | POA: Diagnosis not present

## 2017-03-07 DIAGNOSIS — E669 Obesity, unspecified: Secondary | ICD-10-CM | POA: Diagnosis not present

## 2017-03-08 DIAGNOSIS — L299 Pruritus, unspecified: Secondary | ICD-10-CM | POA: Diagnosis not present

## 2017-03-08 DIAGNOSIS — L308 Other specified dermatitis: Secondary | ICD-10-CM | POA: Diagnosis not present

## 2017-03-08 DIAGNOSIS — L853 Xerosis cutis: Secondary | ICD-10-CM | POA: Diagnosis not present

## 2017-03-08 DIAGNOSIS — L300 Nummular dermatitis: Secondary | ICD-10-CM | POA: Diagnosis not present

## 2017-04-05 DIAGNOSIS — L853 Xerosis cutis: Secondary | ICD-10-CM | POA: Diagnosis not present

## 2017-04-05 DIAGNOSIS — L219 Seborrheic dermatitis, unspecified: Secondary | ICD-10-CM | POA: Diagnosis not present

## 2017-04-05 DIAGNOSIS — L300 Nummular dermatitis: Secondary | ICD-10-CM | POA: Diagnosis not present

## 2017-04-05 DIAGNOSIS — L299 Pruritus, unspecified: Secondary | ICD-10-CM | POA: Diagnosis not present

## 2017-04-05 DIAGNOSIS — L820 Inflamed seborrheic keratosis: Secondary | ICD-10-CM | POA: Diagnosis not present

## 2017-04-11 DIAGNOSIS — J309 Allergic rhinitis, unspecified: Secondary | ICD-10-CM | POA: Diagnosis not present

## 2017-04-11 DIAGNOSIS — I2510 Atherosclerotic heart disease of native coronary artery without angina pectoris: Secondary | ICD-10-CM | POA: Diagnosis not present

## 2017-04-11 DIAGNOSIS — I10 Essential (primary) hypertension: Secondary | ICD-10-CM | POA: Diagnosis not present

## 2017-04-11 DIAGNOSIS — Z6834 Body mass index (BMI) 34.0-34.9, adult: Secondary | ICD-10-CM | POA: Diagnosis not present

## 2017-04-11 DIAGNOSIS — I480 Paroxysmal atrial fibrillation: Secondary | ICD-10-CM | POA: Diagnosis not present

## 2017-04-11 DIAGNOSIS — K219 Gastro-esophageal reflux disease without esophagitis: Secondary | ICD-10-CM | POA: Diagnosis not present

## 2017-04-11 DIAGNOSIS — J449 Chronic obstructive pulmonary disease, unspecified: Secondary | ICD-10-CM | POA: Diagnosis not present

## 2017-04-11 DIAGNOSIS — Z79899 Other long term (current) drug therapy: Secondary | ICD-10-CM | POA: Diagnosis not present

## 2017-04-11 DIAGNOSIS — N390 Urinary tract infection, site not specified: Secondary | ICD-10-CM | POA: Diagnosis not present

## 2017-08-07 DIAGNOSIS — L309 Dermatitis, unspecified: Secondary | ICD-10-CM | POA: Diagnosis not present

## 2017-08-07 DIAGNOSIS — M62838 Other muscle spasm: Secondary | ICD-10-CM | POA: Diagnosis not present

## 2017-08-27 DIAGNOSIS — N390 Urinary tract infection, site not specified: Secondary | ICD-10-CM | POA: Diagnosis not present

## 2017-08-27 DIAGNOSIS — D72829 Elevated white blood cell count, unspecified: Secondary | ICD-10-CM | POA: Diagnosis not present

## 2017-08-27 DIAGNOSIS — N2889 Other specified disorders of kidney and ureter: Secondary | ICD-10-CM | POA: Diagnosis not present

## 2017-09-05 DIAGNOSIS — I517 Cardiomegaly: Secondary | ICD-10-CM | POA: Diagnosis not present

## 2017-09-05 DIAGNOSIS — I700 Atherosclerosis of aorta: Secondary | ICD-10-CM | POA: Diagnosis not present

## 2017-09-05 DIAGNOSIS — K429 Umbilical hernia without obstruction or gangrene: Secondary | ICD-10-CM | POA: Diagnosis not present

## 2017-09-05 DIAGNOSIS — K5730 Diverticulosis of large intestine without perforation or abscess without bleeding: Secondary | ICD-10-CM | POA: Diagnosis not present

## 2017-09-05 DIAGNOSIS — N2889 Other specified disorders of kidney and ureter: Secondary | ICD-10-CM | POA: Diagnosis not present

## 2017-10-09 DIAGNOSIS — J449 Chronic obstructive pulmonary disease, unspecified: Secondary | ICD-10-CM | POA: Diagnosis not present

## 2017-10-09 DIAGNOSIS — Z9181 History of falling: Secondary | ICD-10-CM | POA: Diagnosis not present

## 2017-10-09 DIAGNOSIS — K219 Gastro-esophageal reflux disease without esophagitis: Secondary | ICD-10-CM | POA: Diagnosis not present

## 2017-10-09 DIAGNOSIS — J309 Allergic rhinitis, unspecified: Secondary | ICD-10-CM | POA: Diagnosis not present

## 2017-10-09 DIAGNOSIS — Z1331 Encounter for screening for depression: Secondary | ICD-10-CM | POA: Diagnosis not present

## 2017-10-09 DIAGNOSIS — Z79899 Other long term (current) drug therapy: Secondary | ICD-10-CM | POA: Diagnosis not present

## 2017-10-09 DIAGNOSIS — E7800 Pure hypercholesterolemia, unspecified: Secondary | ICD-10-CM | POA: Diagnosis not present

## 2017-10-09 DIAGNOSIS — I2510 Atherosclerotic heart disease of native coronary artery without angina pectoris: Secondary | ICD-10-CM | POA: Diagnosis not present

## 2017-10-17 DIAGNOSIS — I2510 Atherosclerotic heart disease of native coronary artery without angina pectoris: Secondary | ICD-10-CM | POA: Diagnosis not present

## 2017-10-17 DIAGNOSIS — I10 Essential (primary) hypertension: Secondary | ICD-10-CM | POA: Diagnosis not present

## 2017-10-17 DIAGNOSIS — E118 Type 2 diabetes mellitus with unspecified complications: Secondary | ICD-10-CM | POA: Diagnosis not present

## 2017-10-17 DIAGNOSIS — R945 Abnormal results of liver function studies: Secondary | ICD-10-CM | POA: Diagnosis not present

## 2017-10-17 DIAGNOSIS — E785 Hyperlipidemia, unspecified: Secondary | ICD-10-CM | POA: Diagnosis not present

## 2017-12-20 DIAGNOSIS — R109 Unspecified abdominal pain: Secondary | ICD-10-CM | POA: Diagnosis not present

## 2017-12-20 DIAGNOSIS — Z6834 Body mass index (BMI) 34.0-34.9, adult: Secondary | ICD-10-CM | POA: Diagnosis not present

## 2017-12-27 DIAGNOSIS — I10 Essential (primary) hypertension: Secondary | ICD-10-CM | POA: Diagnosis not present

## 2017-12-27 DIAGNOSIS — R0600 Dyspnea, unspecified: Secondary | ICD-10-CM | POA: Diagnosis not present

## 2017-12-27 DIAGNOSIS — I2510 Atherosclerotic heart disease of native coronary artery without angina pectoris: Secondary | ICD-10-CM | POA: Diagnosis not present

## 2017-12-27 DIAGNOSIS — I4891 Unspecified atrial fibrillation: Secondary | ICD-10-CM | POA: Diagnosis not present

## 2017-12-27 DIAGNOSIS — E785 Hyperlipidemia, unspecified: Secondary | ICD-10-CM | POA: Diagnosis not present

## 2018-01-09 DIAGNOSIS — I4891 Unspecified atrial fibrillation: Secondary | ICD-10-CM | POA: Diagnosis not present

## 2018-01-09 DIAGNOSIS — E785 Hyperlipidemia, unspecified: Secondary | ICD-10-CM | POA: Diagnosis not present

## 2018-01-09 DIAGNOSIS — I2510 Atherosclerotic heart disease of native coronary artery without angina pectoris: Secondary | ICD-10-CM | POA: Diagnosis not present

## 2018-01-09 DIAGNOSIS — I10 Essential (primary) hypertension: Secondary | ICD-10-CM | POA: Diagnosis not present

## 2018-02-18 DIAGNOSIS — Z6835 Body mass index (BMI) 35.0-35.9, adult: Secondary | ICD-10-CM | POA: Diagnosis not present

## 2018-02-18 DIAGNOSIS — L989 Disorder of the skin and subcutaneous tissue, unspecified: Secondary | ICD-10-CM | POA: Diagnosis not present

## 2018-04-17 DIAGNOSIS — K219 Gastro-esophageal reflux disease without esophagitis: Secondary | ICD-10-CM | POA: Diagnosis not present

## 2018-04-17 DIAGNOSIS — J309 Allergic rhinitis, unspecified: Secondary | ICD-10-CM | POA: Diagnosis not present

## 2018-04-17 DIAGNOSIS — J449 Chronic obstructive pulmonary disease, unspecified: Secondary | ICD-10-CM | POA: Diagnosis not present

## 2018-04-17 DIAGNOSIS — E7800 Pure hypercholesterolemia, unspecified: Secondary | ICD-10-CM | POA: Diagnosis not present

## 2018-04-17 DIAGNOSIS — I2510 Atherosclerotic heart disease of native coronary artery without angina pectoris: Secondary | ICD-10-CM | POA: Diagnosis not present

## 2018-04-17 DIAGNOSIS — Z79899 Other long term (current) drug therapy: Secondary | ICD-10-CM | POA: Diagnosis not present

## 2018-04-18 DIAGNOSIS — L570 Actinic keratosis: Secondary | ICD-10-CM | POA: Diagnosis not present

## 2018-04-18 DIAGNOSIS — D485 Neoplasm of uncertain behavior of skin: Secondary | ICD-10-CM | POA: Diagnosis not present

## 2018-10-16 DIAGNOSIS — I480 Paroxysmal atrial fibrillation: Secondary | ICD-10-CM | POA: Diagnosis not present

## 2018-10-16 DIAGNOSIS — I2510 Atherosclerotic heart disease of native coronary artery without angina pectoris: Secondary | ICD-10-CM | POA: Diagnosis not present

## 2018-10-16 DIAGNOSIS — H2513 Age-related nuclear cataract, bilateral: Secondary | ICD-10-CM | POA: Diagnosis not present

## 2018-10-16 DIAGNOSIS — E785 Hyperlipidemia, unspecified: Secondary | ICD-10-CM | POA: Diagnosis not present

## 2018-10-16 DIAGNOSIS — I10 Essential (primary) hypertension: Secondary | ICD-10-CM | POA: Diagnosis not present

## 2018-10-31 DIAGNOSIS — Z1331 Encounter for screening for depression: Secondary | ICD-10-CM | POA: Diagnosis not present

## 2018-10-31 DIAGNOSIS — Z6835 Body mass index (BMI) 35.0-35.9, adult: Secondary | ICD-10-CM | POA: Diagnosis not present

## 2018-10-31 DIAGNOSIS — E785 Hyperlipidemia, unspecified: Secondary | ICD-10-CM | POA: Diagnosis not present

## 2018-10-31 DIAGNOSIS — Z139 Encounter for screening, unspecified: Secondary | ICD-10-CM | POA: Diagnosis not present

## 2018-10-31 DIAGNOSIS — Z9181 History of falling: Secondary | ICD-10-CM | POA: Diagnosis not present

## 2018-10-31 DIAGNOSIS — Z0000 Encounter for general adult medical examination without abnormal findings: Secondary | ICD-10-CM | POA: Diagnosis not present

## 2018-12-03 DIAGNOSIS — I480 Paroxysmal atrial fibrillation: Secondary | ICD-10-CM | POA: Diagnosis not present

## 2018-12-03 DIAGNOSIS — J449 Chronic obstructive pulmonary disease, unspecified: Secondary | ICD-10-CM | POA: Diagnosis not present

## 2018-12-03 DIAGNOSIS — Z79899 Other long term (current) drug therapy: Secondary | ICD-10-CM | POA: Diagnosis not present

## 2018-12-03 DIAGNOSIS — I2510 Atherosclerotic heart disease of native coronary artery without angina pectoris: Secondary | ICD-10-CM | POA: Diagnosis not present

## 2018-12-03 DIAGNOSIS — K219 Gastro-esophageal reflux disease without esophagitis: Secondary | ICD-10-CM | POA: Diagnosis not present

## 2018-12-03 DIAGNOSIS — E7800 Pure hypercholesterolemia, unspecified: Secondary | ICD-10-CM | POA: Diagnosis not present

## 2018-12-06 DIAGNOSIS — N390 Urinary tract infection, site not specified: Secondary | ICD-10-CM | POA: Diagnosis not present

## 2019-06-02 DIAGNOSIS — Z79899 Other long term (current) drug therapy: Secondary | ICD-10-CM | POA: Diagnosis not present

## 2019-06-02 DIAGNOSIS — K219 Gastro-esophageal reflux disease without esophagitis: Secondary | ICD-10-CM | POA: Diagnosis not present

## 2019-06-02 DIAGNOSIS — E7800 Pure hypercholesterolemia, unspecified: Secondary | ICD-10-CM | POA: Diagnosis not present

## 2019-06-02 DIAGNOSIS — I2510 Atherosclerotic heart disease of native coronary artery without angina pectoris: Secondary | ICD-10-CM | POA: Diagnosis not present

## 2019-06-02 DIAGNOSIS — I480 Paroxysmal atrial fibrillation: Secondary | ICD-10-CM | POA: Diagnosis not present

## 2019-06-02 DIAGNOSIS — J449 Chronic obstructive pulmonary disease, unspecified: Secondary | ICD-10-CM | POA: Diagnosis not present

## 2019-11-03 DIAGNOSIS — Z0000 Encounter for general adult medical examination without abnormal findings: Secondary | ICD-10-CM | POA: Diagnosis not present

## 2019-11-03 DIAGNOSIS — Z9181 History of falling: Secondary | ICD-10-CM | POA: Diagnosis not present

## 2019-11-03 DIAGNOSIS — Z6835 Body mass index (BMI) 35.0-35.9, adult: Secondary | ICD-10-CM | POA: Diagnosis not present

## 2019-11-03 DIAGNOSIS — E785 Hyperlipidemia, unspecified: Secondary | ICD-10-CM | POA: Diagnosis not present

## 2019-11-03 DIAGNOSIS — Z1331 Encounter for screening for depression: Secondary | ICD-10-CM | POA: Diagnosis not present

## 2019-11-12 DIAGNOSIS — I10 Essential (primary) hypertension: Secondary | ICD-10-CM | POA: Diagnosis not present

## 2019-11-12 DIAGNOSIS — I2510 Atherosclerotic heart disease of native coronary artery without angina pectoris: Secondary | ICD-10-CM | POA: Diagnosis not present

## 2019-11-12 DIAGNOSIS — I4891 Unspecified atrial fibrillation: Secondary | ICD-10-CM | POA: Diagnosis not present

## 2019-11-12 DIAGNOSIS — E785 Hyperlipidemia, unspecified: Secondary | ICD-10-CM | POA: Diagnosis not present

## 2019-12-11 DIAGNOSIS — J309 Allergic rhinitis, unspecified: Secondary | ICD-10-CM | POA: Diagnosis not present

## 2019-12-11 DIAGNOSIS — Z6835 Body mass index (BMI) 35.0-35.9, adult: Secondary | ICD-10-CM | POA: Diagnosis not present

## 2019-12-11 DIAGNOSIS — Z79899 Other long term (current) drug therapy: Secondary | ICD-10-CM | POA: Diagnosis not present

## 2019-12-11 DIAGNOSIS — E7800 Pure hypercholesterolemia, unspecified: Secondary | ICD-10-CM | POA: Diagnosis not present

## 2019-12-11 DIAGNOSIS — I480 Paroxysmal atrial fibrillation: Secondary | ICD-10-CM | POA: Diagnosis not present

## 2019-12-11 DIAGNOSIS — K219 Gastro-esophageal reflux disease without esophagitis: Secondary | ICD-10-CM | POA: Diagnosis not present

## 2019-12-11 DIAGNOSIS — Z1331 Encounter for screening for depression: Secondary | ICD-10-CM | POA: Diagnosis not present

## 2019-12-11 DIAGNOSIS — M545 Low back pain: Secondary | ICD-10-CM | POA: Diagnosis not present

## 2019-12-11 DIAGNOSIS — J449 Chronic obstructive pulmonary disease, unspecified: Secondary | ICD-10-CM | POA: Diagnosis not present

## 2019-12-11 DIAGNOSIS — Z9181 History of falling: Secondary | ICD-10-CM | POA: Diagnosis not present

## 2019-12-11 DIAGNOSIS — R609 Edema, unspecified: Secondary | ICD-10-CM | POA: Diagnosis not present

## 2019-12-11 DIAGNOSIS — I2510 Atherosclerotic heart disease of native coronary artery without angina pectoris: Secondary | ICD-10-CM | POA: Diagnosis not present

## 2019-12-31 DIAGNOSIS — I480 Paroxysmal atrial fibrillation: Secondary | ICD-10-CM | POA: Diagnosis not present

## 2019-12-31 DIAGNOSIS — M5416 Radiculopathy, lumbar region: Secondary | ICD-10-CM | POA: Diagnosis not present

## 2019-12-31 DIAGNOSIS — Z6836 Body mass index (BMI) 36.0-36.9, adult: Secondary | ICD-10-CM | POA: Diagnosis not present

## 2019-12-31 DIAGNOSIS — R609 Edema, unspecified: Secondary | ICD-10-CM | POA: Diagnosis not present

## 2019-12-31 DIAGNOSIS — I10 Essential (primary) hypertension: Secondary | ICD-10-CM | POA: Diagnosis not present

## 2020-02-04 DIAGNOSIS — Z6836 Body mass index (BMI) 36.0-36.9, adult: Secondary | ICD-10-CM | POA: Diagnosis not present

## 2020-02-04 DIAGNOSIS — N390 Urinary tract infection, site not specified: Secondary | ICD-10-CM | POA: Diagnosis not present

## 2020-02-04 DIAGNOSIS — R300 Dysuria: Secondary | ICD-10-CM | POA: Diagnosis not present

## 2020-02-04 DIAGNOSIS — L299 Pruritus, unspecified: Secondary | ICD-10-CM | POA: Diagnosis not present

## 2020-02-04 DIAGNOSIS — R1010 Upper abdominal pain, unspecified: Secondary | ICD-10-CM | POA: Diagnosis not present

## 2020-02-10 DIAGNOSIS — E11311 Type 2 diabetes mellitus with unspecified diabetic retinopathy with macular edema: Secondary | ICD-10-CM | POA: Diagnosis not present

## 2020-02-19 DIAGNOSIS — Z6836 Body mass index (BMI) 36.0-36.9, adult: Secondary | ICD-10-CM | POA: Diagnosis not present

## 2020-02-19 DIAGNOSIS — R300 Dysuria: Secondary | ICD-10-CM | POA: Diagnosis not present

## 2020-03-31 DIAGNOSIS — Z20822 Contact with and (suspected) exposure to covid-19: Secondary | ICD-10-CM | POA: Diagnosis not present

## 2020-04-19 DIAGNOSIS — Z20822 Contact with and (suspected) exposure to covid-19: Secondary | ICD-10-CM | POA: Diagnosis not present

## 2020-04-19 DIAGNOSIS — N390 Urinary tract infection, site not specified: Secondary | ICD-10-CM | POA: Diagnosis not present

## 2020-04-28 DIAGNOSIS — K921 Melena: Secondary | ICD-10-CM | POA: Diagnosis not present

## 2020-04-28 DIAGNOSIS — I480 Paroxysmal atrial fibrillation: Secondary | ICD-10-CM | POA: Diagnosis not present

## 2020-04-28 DIAGNOSIS — I2510 Atherosclerotic heart disease of native coronary artery without angina pectoris: Secondary | ICD-10-CM | POA: Diagnosis not present

## 2020-04-28 DIAGNOSIS — Z6825 Body mass index (BMI) 25.0-25.9, adult: Secondary | ICD-10-CM | POA: Diagnosis not present

## 2020-04-28 DIAGNOSIS — R109 Unspecified abdominal pain: Secondary | ICD-10-CM | POA: Diagnosis not present

## 2020-04-28 DIAGNOSIS — K922 Gastrointestinal hemorrhage, unspecified: Secondary | ICD-10-CM | POA: Diagnosis not present

## 2020-05-03 DIAGNOSIS — I2510 Atherosclerotic heart disease of native coronary artery without angina pectoris: Secondary | ICD-10-CM | POA: Diagnosis not present

## 2020-05-03 DIAGNOSIS — K922 Gastrointestinal hemorrhage, unspecified: Secondary | ICD-10-CM | POA: Diagnosis not present

## 2020-05-03 DIAGNOSIS — Z6835 Body mass index (BMI) 35.0-35.9, adult: Secondary | ICD-10-CM | POA: Diagnosis not present

## 2020-05-03 DIAGNOSIS — I480 Paroxysmal atrial fibrillation: Secondary | ICD-10-CM | POA: Diagnosis not present

## 2020-05-07 DIAGNOSIS — Z20822 Contact with and (suspected) exposure to covid-19: Secondary | ICD-10-CM | POA: Diagnosis not present

## 2020-05-07 DIAGNOSIS — J309 Allergic rhinitis, unspecified: Secondary | ICD-10-CM | POA: Diagnosis not present

## 2020-05-18 DIAGNOSIS — E785 Hyperlipidemia, unspecified: Secondary | ICD-10-CM | POA: Diagnosis not present

## 2020-05-18 DIAGNOSIS — I4891 Unspecified atrial fibrillation: Secondary | ICD-10-CM | POA: Diagnosis not present

## 2020-05-18 DIAGNOSIS — I2510 Atherosclerotic heart disease of native coronary artery without angina pectoris: Secondary | ICD-10-CM | POA: Diagnosis not present

## 2020-05-18 DIAGNOSIS — I10 Essential (primary) hypertension: Secondary | ICD-10-CM | POA: Diagnosis not present

## 2020-06-14 DIAGNOSIS — I2510 Atherosclerotic heart disease of native coronary artery without angina pectoris: Secondary | ICD-10-CM | POA: Diagnosis not present

## 2020-06-14 DIAGNOSIS — I480 Paroxysmal atrial fibrillation: Secondary | ICD-10-CM | POA: Diagnosis not present

## 2020-06-14 DIAGNOSIS — E7800 Pure hypercholesterolemia, unspecified: Secondary | ICD-10-CM | POA: Diagnosis not present

## 2020-06-14 DIAGNOSIS — J449 Chronic obstructive pulmonary disease, unspecified: Secondary | ICD-10-CM | POA: Diagnosis not present

## 2020-06-14 DIAGNOSIS — K219 Gastro-esophageal reflux disease without esophagitis: Secondary | ICD-10-CM | POA: Diagnosis not present

## 2020-06-14 DIAGNOSIS — Z6835 Body mass index (BMI) 35.0-35.9, adult: Secondary | ICD-10-CM | POA: Diagnosis not present

## 2020-06-14 DIAGNOSIS — Z79899 Other long term (current) drug therapy: Secondary | ICD-10-CM | POA: Diagnosis not present

## 2020-06-14 DIAGNOSIS — J309 Allergic rhinitis, unspecified: Secondary | ICD-10-CM | POA: Diagnosis not present

## 2020-06-14 DIAGNOSIS — M545 Low back pain, unspecified: Secondary | ICD-10-CM | POA: Diagnosis not present

## 2020-06-14 DIAGNOSIS — Z139 Encounter for screening, unspecified: Secondary | ICD-10-CM | POA: Diagnosis not present

## 2020-06-14 DIAGNOSIS — R609 Edema, unspecified: Secondary | ICD-10-CM | POA: Diagnosis not present

## 2020-06-14 DIAGNOSIS — I10 Essential (primary) hypertension: Secondary | ICD-10-CM | POA: Diagnosis not present

## 2020-06-23 DIAGNOSIS — E785 Hyperlipidemia, unspecified: Secondary | ICD-10-CM | POA: Diagnosis not present

## 2020-06-23 DIAGNOSIS — I4891 Unspecified atrial fibrillation: Secondary | ICD-10-CM | POA: Diagnosis not present

## 2020-06-23 DIAGNOSIS — R0600 Dyspnea, unspecified: Secondary | ICD-10-CM | POA: Diagnosis not present

## 2020-06-23 DIAGNOSIS — I2510 Atherosclerotic heart disease of native coronary artery without angina pectoris: Secondary | ICD-10-CM | POA: Diagnosis not present

## 2020-11-04 DIAGNOSIS — Z0000 Encounter for general adult medical examination without abnormal findings: Secondary | ICD-10-CM | POA: Diagnosis not present

## 2020-11-04 DIAGNOSIS — E669 Obesity, unspecified: Secondary | ICD-10-CM | POA: Diagnosis not present

## 2020-11-04 DIAGNOSIS — Z1331 Encounter for screening for depression: Secondary | ICD-10-CM | POA: Diagnosis not present

## 2020-11-04 DIAGNOSIS — E785 Hyperlipidemia, unspecified: Secondary | ICD-10-CM | POA: Diagnosis not present

## 2020-11-04 DIAGNOSIS — Z9181 History of falling: Secondary | ICD-10-CM | POA: Diagnosis not present

## 2020-11-30 DIAGNOSIS — E7800 Pure hypercholesterolemia, unspecified: Secondary | ICD-10-CM | POA: Diagnosis not present

## 2020-11-30 DIAGNOSIS — N390 Urinary tract infection, site not specified: Secondary | ICD-10-CM | POA: Diagnosis not present

## 2020-11-30 DIAGNOSIS — I480 Paroxysmal atrial fibrillation: Secondary | ICD-10-CM | POA: Diagnosis not present

## 2020-11-30 DIAGNOSIS — R1032 Left lower quadrant pain: Secondary | ICD-10-CM | POA: Diagnosis not present

## 2020-11-30 DIAGNOSIS — I2510 Atherosclerotic heart disease of native coronary artery without angina pectoris: Secondary | ICD-10-CM | POA: Diagnosis not present

## 2020-11-30 DIAGNOSIS — Z79899 Other long term (current) drug therapy: Secondary | ICD-10-CM | POA: Diagnosis not present

## 2020-11-30 DIAGNOSIS — Z6834 Body mass index (BMI) 34.0-34.9, adult: Secondary | ICD-10-CM | POA: Diagnosis not present

## 2020-12-13 DIAGNOSIS — I2510 Atherosclerotic heart disease of native coronary artery without angina pectoris: Secondary | ICD-10-CM | POA: Diagnosis not present

## 2020-12-13 DIAGNOSIS — I480 Paroxysmal atrial fibrillation: Secondary | ICD-10-CM | POA: Diagnosis not present

## 2020-12-13 DIAGNOSIS — G4700 Insomnia, unspecified: Secondary | ICD-10-CM | POA: Diagnosis not present

## 2020-12-13 DIAGNOSIS — J309 Allergic rhinitis, unspecified: Secondary | ICD-10-CM | POA: Diagnosis not present

## 2020-12-13 DIAGNOSIS — I10 Essential (primary) hypertension: Secondary | ICD-10-CM | POA: Diagnosis not present

## 2020-12-13 DIAGNOSIS — K219 Gastro-esophageal reflux disease without esophagitis: Secondary | ICD-10-CM | POA: Diagnosis not present

## 2020-12-13 DIAGNOSIS — M545 Low back pain, unspecified: Secondary | ICD-10-CM | POA: Diagnosis not present

## 2020-12-13 DIAGNOSIS — Z79899 Other long term (current) drug therapy: Secondary | ICD-10-CM | POA: Diagnosis not present

## 2020-12-13 DIAGNOSIS — E7800 Pure hypercholesterolemia, unspecified: Secondary | ICD-10-CM | POA: Diagnosis not present

## 2020-12-13 DIAGNOSIS — R609 Edema, unspecified: Secondary | ICD-10-CM | POA: Diagnosis not present

## 2020-12-13 DIAGNOSIS — J449 Chronic obstructive pulmonary disease, unspecified: Secondary | ICD-10-CM | POA: Diagnosis not present

## 2020-12-13 DIAGNOSIS — R339 Retention of urine, unspecified: Secondary | ICD-10-CM | POA: Diagnosis not present

## 2020-12-15 DIAGNOSIS — I4891 Unspecified atrial fibrillation: Secondary | ICD-10-CM | POA: Diagnosis not present

## 2020-12-15 DIAGNOSIS — R5383 Other fatigue: Secondary | ICD-10-CM | POA: Diagnosis not present

## 2020-12-15 DIAGNOSIS — I2510 Atherosclerotic heart disease of native coronary artery without angina pectoris: Secondary | ICD-10-CM | POA: Diagnosis not present

## 2020-12-15 DIAGNOSIS — I771 Stricture of artery: Secondary | ICD-10-CM | POA: Diagnosis not present

## 2020-12-15 DIAGNOSIS — I25110 Atherosclerotic heart disease of native coronary artery with unstable angina pectoris: Secondary | ICD-10-CM | POA: Diagnosis not present

## 2020-12-15 DIAGNOSIS — E785 Hyperlipidemia, unspecified: Secondary | ICD-10-CM | POA: Diagnosis not present

## 2020-12-15 DIAGNOSIS — I10 Essential (primary) hypertension: Secondary | ICD-10-CM | POA: Diagnosis not present

## 2020-12-28 DIAGNOSIS — Z6834 Body mass index (BMI) 34.0-34.9, adult: Secondary | ICD-10-CM | POA: Diagnosis not present

## 2020-12-28 DIAGNOSIS — K922 Gastrointestinal hemorrhage, unspecified: Secondary | ICD-10-CM | POA: Diagnosis not present

## 2021-01-10 DIAGNOSIS — I4891 Unspecified atrial fibrillation: Secondary | ICD-10-CM | POA: Diagnosis not present

## 2021-01-10 DIAGNOSIS — K922 Gastrointestinal hemorrhage, unspecified: Secondary | ICD-10-CM | POA: Diagnosis not present

## 2021-01-10 DIAGNOSIS — K921 Melena: Secondary | ICD-10-CM | POA: Diagnosis not present

## 2021-01-11 DIAGNOSIS — H00011 Hordeolum externum right upper eyelid: Secondary | ICD-10-CM | POA: Diagnosis not present

## 2021-01-17 DIAGNOSIS — K5710 Diverticulosis of small intestine without perforation or abscess without bleeding: Secondary | ICD-10-CM | POA: Diagnosis not present

## 2021-01-17 DIAGNOSIS — K921 Melena: Secondary | ICD-10-CM | POA: Diagnosis not present

## 2021-01-17 DIAGNOSIS — K449 Diaphragmatic hernia without obstruction or gangrene: Secondary | ICD-10-CM | POA: Diagnosis not present

## 2021-02-08 DIAGNOSIS — J449 Chronic obstructive pulmonary disease, unspecified: Secondary | ICD-10-CM | POA: Diagnosis not present

## 2021-02-08 DIAGNOSIS — I2510 Atherosclerotic heart disease of native coronary artery without angina pectoris: Secondary | ICD-10-CM | POA: Diagnosis not present

## 2021-02-08 DIAGNOSIS — U071 COVID-19: Secondary | ICD-10-CM | POA: Diagnosis not present

## 2021-02-08 DIAGNOSIS — Z6834 Body mass index (BMI) 34.0-34.9, adult: Secondary | ICD-10-CM | POA: Diagnosis not present

## 2021-03-07 DIAGNOSIS — E785 Hyperlipidemia, unspecified: Secondary | ICD-10-CM | POA: Diagnosis not present

## 2021-03-07 DIAGNOSIS — I359 Nonrheumatic aortic valve disorder, unspecified: Secondary | ICD-10-CM | POA: Diagnosis not present

## 2021-03-07 DIAGNOSIS — I129 Hypertensive chronic kidney disease with stage 1 through stage 4 chronic kidney disease, or unspecified chronic kidney disease: Secondary | ICD-10-CM | POA: Diagnosis not present

## 2021-03-07 DIAGNOSIS — I4891 Unspecified atrial fibrillation: Secondary | ICD-10-CM | POA: Diagnosis not present

## 2021-03-30 DIAGNOSIS — I2510 Atherosclerotic heart disease of native coronary artery without angina pectoris: Secondary | ICD-10-CM | POA: Diagnosis not present

## 2021-03-30 DIAGNOSIS — E7800 Pure hypercholesterolemia, unspecified: Secondary | ICD-10-CM | POA: Diagnosis not present

## 2021-03-30 DIAGNOSIS — Z79899 Other long term (current) drug therapy: Secondary | ICD-10-CM | POA: Diagnosis not present

## 2021-03-30 DIAGNOSIS — M545 Low back pain, unspecified: Secondary | ICD-10-CM | POA: Diagnosis not present

## 2021-03-30 DIAGNOSIS — I10 Essential (primary) hypertension: Secondary | ICD-10-CM | POA: Diagnosis not present

## 2021-03-30 DIAGNOSIS — J449 Chronic obstructive pulmonary disease, unspecified: Secondary | ICD-10-CM | POA: Diagnosis not present

## 2021-03-30 DIAGNOSIS — K219 Gastro-esophageal reflux disease without esophagitis: Secondary | ICD-10-CM | POA: Diagnosis not present

## 2021-03-30 DIAGNOSIS — Z6835 Body mass index (BMI) 35.0-35.9, adult: Secondary | ICD-10-CM | POA: Diagnosis not present

## 2021-03-30 DIAGNOSIS — J019 Acute sinusitis, unspecified: Secondary | ICD-10-CM | POA: Diagnosis not present

## 2021-03-30 DIAGNOSIS — J309 Allergic rhinitis, unspecified: Secondary | ICD-10-CM | POA: Diagnosis not present

## 2021-03-30 DIAGNOSIS — I480 Paroxysmal atrial fibrillation: Secondary | ICD-10-CM | POA: Diagnosis not present

## 2021-03-30 DIAGNOSIS — K922 Gastrointestinal hemorrhage, unspecified: Secondary | ICD-10-CM | POA: Diagnosis not present

## 2021-06-14 DIAGNOSIS — M545 Low back pain, unspecified: Secondary | ICD-10-CM | POA: Diagnosis not present

## 2021-06-14 DIAGNOSIS — Z79899 Other long term (current) drug therapy: Secondary | ICD-10-CM | POA: Diagnosis not present

## 2021-06-14 DIAGNOSIS — I2510 Atherosclerotic heart disease of native coronary artery without angina pectoris: Secondary | ICD-10-CM | POA: Diagnosis not present

## 2021-06-14 DIAGNOSIS — Z6834 Body mass index (BMI) 34.0-34.9, adult: Secondary | ICD-10-CM | POA: Diagnosis not present

## 2021-06-14 DIAGNOSIS — E7800 Pure hypercholesterolemia, unspecified: Secondary | ICD-10-CM | POA: Diagnosis not present

## 2021-06-14 DIAGNOSIS — L508 Other urticaria: Secondary | ICD-10-CM | POA: Diagnosis not present

## 2021-06-14 DIAGNOSIS — I10 Essential (primary) hypertension: Secondary | ICD-10-CM | POA: Diagnosis not present

## 2021-06-14 DIAGNOSIS — J309 Allergic rhinitis, unspecified: Secondary | ICD-10-CM | POA: Diagnosis not present

## 2021-06-14 DIAGNOSIS — J449 Chronic obstructive pulmonary disease, unspecified: Secondary | ICD-10-CM | POA: Diagnosis not present

## 2021-06-14 DIAGNOSIS — K219 Gastro-esophageal reflux disease without esophagitis: Secondary | ICD-10-CM | POA: Diagnosis not present

## 2021-06-14 DIAGNOSIS — I480 Paroxysmal atrial fibrillation: Secondary | ICD-10-CM | POA: Diagnosis not present

## 2021-07-29 DIAGNOSIS — H2513 Age-related nuclear cataract, bilateral: Secondary | ICD-10-CM | POA: Diagnosis not present

## 2021-08-06 DIAGNOSIS — G4489 Other headache syndrome: Secondary | ICD-10-CM | POA: Diagnosis not present

## 2021-08-06 DIAGNOSIS — I4891 Unspecified atrial fibrillation: Secondary | ICD-10-CM | POA: Diagnosis not present

## 2021-08-06 DIAGNOSIS — R000 Tachycardia, unspecified: Secondary | ICD-10-CM | POA: Diagnosis not present

## 2021-08-06 DIAGNOSIS — R4182 Altered mental status, unspecified: Secondary | ICD-10-CM | POA: Diagnosis not present

## 2021-08-06 DIAGNOSIS — R4781 Slurred speech: Secondary | ICD-10-CM | POA: Diagnosis not present

## 2021-08-07 ENCOUNTER — Encounter (HOSPITAL_COMMUNITY): Payer: Self-pay

## 2021-08-07 ENCOUNTER — Observation Stay (HOSPITAL_BASED_OUTPATIENT_CLINIC_OR_DEPARTMENT_OTHER): Payer: Medicare Other

## 2021-08-07 ENCOUNTER — Emergency Department (HOSPITAL_COMMUNITY): Payer: Medicare Other

## 2021-08-07 ENCOUNTER — Observation Stay (HOSPITAL_COMMUNITY): Payer: Medicare Other

## 2021-08-07 ENCOUNTER — Other Ambulatory Visit: Payer: Self-pay

## 2021-08-07 ENCOUNTER — Observation Stay (HOSPITAL_COMMUNITY)
Admission: EM | Admit: 2021-08-07 | Discharge: 2021-08-07 | Disposition: A | Discharge status: Home / Self Care | Payer: Medicare Other | Attending: Family Medicine | Admitting: Family Medicine

## 2021-08-07 DIAGNOSIS — N1831 Chronic kidney disease, stage 3a: Secondary | ICD-10-CM

## 2021-08-07 DIAGNOSIS — Z91148 Patient's other noncompliance with medication regimen for other reason: Secondary | ICD-10-CM | POA: Insufficient documentation

## 2021-08-07 DIAGNOSIS — Z7982 Long term (current) use of aspirin: Secondary | ICD-10-CM | POA: Diagnosis not present

## 2021-08-07 DIAGNOSIS — I4891 Unspecified atrial fibrillation: Secondary | ICD-10-CM | POA: Diagnosis not present

## 2021-08-07 DIAGNOSIS — I10 Essential (primary) hypertension: Secondary | ICD-10-CM | POA: Diagnosis present

## 2021-08-07 DIAGNOSIS — G459 Transient cerebral ischemic attack, unspecified: Secondary | ICD-10-CM | POA: Diagnosis not present

## 2021-08-07 DIAGNOSIS — I129 Hypertensive chronic kidney disease with stage 1 through stage 4 chronic kidney disease, or unspecified chronic kidney disease: Secondary | ICD-10-CM | POA: Insufficient documentation

## 2021-08-07 DIAGNOSIS — K118 Other diseases of salivary glands: Secondary | ICD-10-CM | POA: Diagnosis not present

## 2021-08-07 DIAGNOSIS — Z7901 Long term (current) use of anticoagulants: Secondary | ICD-10-CM | POA: Diagnosis not present

## 2021-08-07 DIAGNOSIS — R4182 Altered mental status, unspecified: Secondary | ICD-10-CM

## 2021-08-07 DIAGNOSIS — E876 Hypokalemia: Secondary | ICD-10-CM | POA: Diagnosis not present

## 2021-08-07 DIAGNOSIS — Z79899 Other long term (current) drug therapy: Secondary | ICD-10-CM | POA: Diagnosis not present

## 2021-08-07 DIAGNOSIS — I480 Paroxysmal atrial fibrillation: Secondary | ICD-10-CM | POA: Diagnosis present

## 2021-08-07 DIAGNOSIS — R2990 Unspecified symptoms and signs involving the nervous system: Secondary | ICD-10-CM | POA: Diagnosis not present

## 2021-08-07 DIAGNOSIS — I639 Cerebral infarction, unspecified: Secondary | ICD-10-CM | POA: Diagnosis not present

## 2021-08-07 DIAGNOSIS — R29818 Other symptoms and signs involving the nervous system: Secondary | ICD-10-CM | POA: Diagnosis not present

## 2021-08-07 DIAGNOSIS — R0602 Shortness of breath: Secondary | ICD-10-CM | POA: Diagnosis not present

## 2021-08-07 DIAGNOSIS — I700 Atherosclerosis of aorta: Secondary | ICD-10-CM | POA: Diagnosis not present

## 2021-08-07 DIAGNOSIS — J9811 Atelectasis: Secondary | ICD-10-CM | POA: Diagnosis not present

## 2021-08-07 HISTORY — DX: Chronic kidney disease, stage 3a: N18.31

## 2021-08-07 HISTORY — DX: Gastro-esophageal reflux disease without esophagitis: K21.9

## 2021-08-07 HISTORY — DX: Essential (primary) hypertension: I10

## 2021-08-07 HISTORY — DX: Paroxysmal atrial fibrillation: I48.0

## 2021-08-07 LAB — HEMOGLOBIN A1C
Hgb A1c MFr Bld: 5.7 % — ABNORMAL HIGH (ref 4.8–5.6)
Mean Plasma Glucose: 116.89 mg/dL

## 2021-08-07 LAB — COMPREHENSIVE METABOLIC PANEL
ALT: 27 U/L (ref 0–44)
ALT: 28 U/L (ref 0–44)
AST: 28 U/L (ref 15–41)
AST: 32 U/L (ref 15–41)
Albumin: 3.2 g/dL — ABNORMAL LOW (ref 3.5–5.0)
Albumin: 3.6 g/dL (ref 3.5–5.0)
Alkaline Phosphatase: 80 U/L (ref 38–126)
Alkaline Phosphatase: 84 U/L (ref 38–126)
Anion gap: 7 (ref 5–15)
Anion gap: 9 (ref 5–15)
BUN: 17 mg/dL (ref 8–23)
BUN: 18 mg/dL (ref 8–23)
CO2: 23 mmol/L (ref 22–32)
CO2: 25 mmol/L (ref 22–32)
Calcium: 9 mg/dL (ref 8.9–10.3)
Calcium: 9.5 mg/dL (ref 8.9–10.3)
Chloride: 107 mmol/L (ref 98–111)
Chloride: 110 mmol/L (ref 98–111)
Creatinine, Ser: 1.1 mg/dL — ABNORMAL HIGH (ref 0.44–1.00)
Creatinine, Ser: 1.15 mg/dL — ABNORMAL HIGH (ref 0.44–1.00)
GFR, Estimated: 46 mL/min — ABNORMAL LOW (ref >60)
GFR, Estimated: 49 mL/min — ABNORMAL LOW (ref >60)
Glucose, Bld: 120 mg/dL — ABNORMAL HIGH (ref 70–99)
Glucose, Bld: 124 mg/dL — ABNORMAL HIGH (ref 70–99)
Potassium: 3.4 mmol/L — ABNORMAL LOW (ref 3.5–5.1)
Potassium: 3.7 mmol/L (ref 3.5–5.1)
Sodium: 140 mmol/L (ref 135–145)
Sodium: 141 mmol/L (ref 135–145)
Total Bilirubin: 0.6 mg/dL (ref 0.3–1.2)
Total Bilirubin: 0.6 mg/dL (ref 0.3–1.2)
Total Protein: 6.5 g/dL (ref 6.5–8.1)
Total Protein: 7.3 g/dL (ref 6.5–8.1)

## 2021-08-07 LAB — ECHOCARDIOGRAM COMPLETE
AR max vel: 2.61 cm2
AV Peak grad: 4.2 mmHg
Ao pk vel: 1.03 m/s
Area-P 1/2: 5.62 cm2
Height: 60 in
P 1/2 time: 816 msec
S' Lateral: 3.1 cm
Weight: 2892.44 oz

## 2021-08-07 LAB — I-STAT CHEM 8, ED
BUN: 19 mg/dL (ref 8–23)
Calcium, Ion: 1.09 mmol/L — ABNORMAL LOW (ref 1.15–1.40)
Chloride: 105 mmol/L (ref 98–111)
Creatinine, Ser: 1.2 mg/dL — ABNORMAL HIGH (ref 0.44–1.00)
Glucose, Bld: 124 mg/dL — ABNORMAL HIGH (ref 70–99)
HCT: 44 % (ref 36.0–46.0)
Hemoglobin: 15 g/dL (ref 12.0–15.0)
Potassium: 3.3 mmol/L — ABNORMAL LOW (ref 3.5–5.1)
Sodium: 141 mmol/L (ref 135–145)
TCO2: 24 mmol/L (ref 22–32)

## 2021-08-07 LAB — CBC
HCT: 44.3 % (ref 36.0–46.0)
Hemoglobin: 14.9 g/dL (ref 12.0–15.0)
MCH: 32.5 pg (ref 26.0–34.0)
MCHC: 33.6 g/dL (ref 30.0–36.0)
MCV: 96.5 fL (ref 80.0–100.0)
Platelets: 185 10*3/uL (ref 150–400)
RBC: 4.59 MIL/uL (ref 3.87–5.11)
RDW: 12.3 % (ref 11.5–15.5)
WBC: 8.7 10*3/uL (ref 4.0–10.5)
nRBC: 0 % (ref 0.0–0.2)

## 2021-08-07 LAB — CBC WITH DIFFERENTIAL/PLATELET
Abs Immature Granulocytes: 0.04 10*3/uL (ref 0.00–0.07)
Basophils Absolute: 0 10*3/uL (ref 0.0–0.1)
Basophils Relative: 0 %
Eosinophils Absolute: 0.2 10*3/uL (ref 0.0–0.5)
Eosinophils Relative: 3 %
HCT: 40.8 % (ref 36.0–46.0)
Hemoglobin: 14.2 g/dL (ref 12.0–15.0)
Immature Granulocytes: 1 %
Lymphocytes Relative: 26 %
Lymphs Abs: 2 10*3/uL (ref 0.7–4.0)
MCH: 33.3 pg (ref 26.0–34.0)
MCHC: 34.8 g/dL (ref 30.0–36.0)
MCV: 95.8 fL (ref 80.0–100.0)
Monocytes Absolute: 0.7 10*3/uL (ref 0.1–1.0)
Monocytes Relative: 9 %
Neutro Abs: 4.6 10*3/uL (ref 1.7–7.7)
Neutrophils Relative %: 61 %
Platelets: 180 10*3/uL (ref 150–400)
RBC: 4.26 MIL/uL (ref 3.87–5.11)
RDW: 12.4 % (ref 11.5–15.5)
WBC: 7.5 10*3/uL (ref 4.0–10.5)
nRBC: 0 % (ref 0.0–0.2)

## 2021-08-07 LAB — LIPID PANEL
Cholesterol: 153 mg/dL (ref 0–200)
HDL: 43 mg/dL (ref >40)
LDL Cholesterol: 90 mg/dL (ref 0–99)
Total CHOL/HDL Ratio: 3.6 RATIO
Triglycerides: 98 mg/dL (ref <150)
VLDL: 20 mg/dL (ref 0–40)

## 2021-08-07 LAB — DIFFERENTIAL
Abs Immature Granulocytes: 0.03 10*3/uL (ref 0.00–0.07)
Basophils Absolute: 0.1 10*3/uL (ref 0.0–0.1)
Basophils Relative: 1 %
Eosinophils Absolute: 0.5 10*3/uL (ref 0.0–0.5)
Eosinophils Relative: 6 %
Immature Granulocytes: 0 %
Lymphocytes Relative: 44 %
Lymphs Abs: 3.8 10*3/uL (ref 0.7–4.0)
Monocytes Absolute: 0.8 10*3/uL (ref 0.1–1.0)
Monocytes Relative: 9 %
Neutro Abs: 3.5 10*3/uL (ref 1.7–7.7)
Neutrophils Relative %: 40 %

## 2021-08-07 LAB — URINALYSIS, ROUTINE W REFLEX MICROSCOPIC
Bilirubin Urine: NEGATIVE
Glucose, UA: NEGATIVE mg/dL
Hgb urine dipstick: NEGATIVE
Ketones, ur: NEGATIVE mg/dL
Leukocytes,Ua: NEGATIVE
Nitrite: NEGATIVE
Protein, ur: NEGATIVE mg/dL
Specific Gravity, Urine: 1.009 (ref 1.005–1.030)
pH: 7 (ref 5.0–8.0)

## 2021-08-07 LAB — PROTIME-INR
INR: 1.1 (ref 0.8–1.2)
Prothrombin Time: 14.3 seconds (ref 11.4–15.2)

## 2021-08-07 LAB — TROPONIN I (HIGH SENSITIVITY)
Troponin I (High Sensitivity): 9 ng/L (ref <18)
Troponin I (High Sensitivity): 8 ng/L (ref <18)

## 2021-08-07 LAB — MAGNESIUM
Magnesium: 1.9 mg/dL (ref 1.7–2.4)
Magnesium: 1.9 mg/dL (ref 1.7–2.4)

## 2021-08-07 LAB — APTT: aPTT: 28 seconds (ref 24–36)

## 2021-08-07 LAB — CK: Total CK: 76 U/L (ref 38–234)

## 2021-08-07 MED ORDER — EZETIMIBE 10 MG PO TABS
10.0000 mg | ORAL_TABLET | Freq: Every day | ORAL | Status: DC
  Administered 2021-08-07: 10 mg via ORAL
  Filled 2021-08-07: qty 1

## 2021-08-07 MED ORDER — SODIUM CHLORIDE 0.9 % IV BOLUS
500.0000 mL | Freq: Once | INTRAVENOUS | Status: AC
  Administered 2021-08-07: 500 mL via INTRAVENOUS

## 2021-08-07 MED ORDER — ACETAMINOPHEN 650 MG RE SUPP: 650.0000 mg | Freq: Four times a day (QID) | RECTAL | Status: DC | PRN

## 2021-08-07 MED ORDER — POTASSIUM CHLORIDE CRYS ER 20 MEQ PO TBCR
40.0000 meq | EXTENDED_RELEASE_TABLET | Freq: Once | ORAL | Status: AC
Start: 2021-08-07 — End: 2021-08-07
  Administered 2021-08-07: 40 meq via ORAL
  Filled 2021-08-07: qty 2

## 2021-08-07 MED ORDER — ACETAMINOPHEN 325 MG PO TABS: 650.0000 mg | ORAL_TABLET | Freq: Four times a day (QID) | ORAL | Status: DC | PRN

## 2021-08-07 MED ORDER — IOHEXOL 300 MG/ML  SOLN
60.0000 mL | Freq: Once | INTRAMUSCULAR | Status: AC | PRN
  Administered 2021-08-07: 60 mL via INTRAVENOUS

## 2021-08-07 MED ORDER — METOPROLOL TARTRATE 5 MG/5ML IV SOLN
5.0000 mg | Freq: Once | INTRAVENOUS | Status: AC
  Administered 2021-08-07: 5 mg via INTRAVENOUS
  Filled 2021-08-07: qty 5

## 2021-08-07 MED ORDER — ATORVASTATIN CALCIUM 40 MG PO TABS: 40.0000 mg | ORAL_TABLET | Freq: Every day | ORAL | Status: DC

## 2021-08-07 MED ORDER — METOPROLOL SUCCINATE ER 25 MG PO TB24: 50.0000 mg | ORAL_TABLET | Freq: Every day | ORAL | Status: DC

## 2021-08-07 MED ORDER — MAGNESIUM SULFATE 2 GM/50ML IV SOLN
2.0000 g | Freq: Once | INTRAVENOUS | Status: AC
  Administered 2021-08-07: 2 g via INTRAVENOUS
  Filled 2021-08-07: qty 50

## 2021-08-07 MED ORDER — EZETIMIBE 10 MG PO TABS: 10.0000 mg | ORAL_TABLET | Freq: Every day | ORAL | 3 refills | Status: DC

## 2021-08-07 MED ORDER — STROKE: EARLY STAGES OF RECOVERY BOOK
Freq: Once | Status: AC
  Administered 2021-08-07: 1
  Filled 2021-08-07: qty 1

## 2021-08-07 MED ORDER — DILTIAZEM HCL ER COATED BEADS 240 MG PO CP24
240.0000 mg | ORAL_CAPSULE | Freq: Every day | ORAL | Status: DC
  Administered 2021-08-07: 240 mg via ORAL
  Filled 2021-08-07 (×2): qty 1

## 2021-08-07 MED ORDER — METOPROLOL SUCCINATE ER 50 MG PO TB24
50.0000 mg | ORAL_TABLET | Freq: Every day | ORAL | Status: DC
Start: 2021-08-07 — End: 2021-08-07
  Administered 2021-08-07: 50 mg via ORAL
  Filled 2021-08-07: qty 2

## 2021-08-07 MED ORDER — APIXABAN 5 MG PO TABS
5.0000 mg | ORAL_TABLET | Freq: Two times a day (BID) | ORAL | Status: DC
  Administered 2021-08-07: 5 mg via ORAL
  Filled 2021-08-07: qty 1

## 2021-08-07 MED ORDER — SODIUM CHLORIDE 0.9% FLUSH
3.0000 mL | Freq: Once | INTRAVENOUS | Status: AC
  Administered 2021-08-07: 3 mL via INTRAVENOUS

## 2021-08-07 NOTE — H&P (Addendum)
History and Physical    PLEASE NOTE THAT DRAGON DICTATION SOFTWARE WAS USED IN THE CONSTRUCTION OF THIS NOTE.   Morgan Jacobs FWY:[id number] DOB: 09-24-1933 DOA: 08/07/2021  PCP: Morgan Bender, PA-C  Patient coming from: home   I have personally briefly reviewed patient's old medical records in [place]  Chief Complaint:   HPI: Morgan Jacobs is a 88 y.o. female with medical history significant for paroxysmal atrial fibrillation chronically anticoagulated on Eliquis, essential hypertension, who is admitted to [facility] on 08/07/2021 for further stroke work-up after presenting from home to [facility] ED complaining of strokelike symptoms.   At 2300 on 08/06/2021, the patient developed sudden onset of slurred speech, as well as new onset weakness in the left upper and lower extremities.  Patient's family, who is with him the patient lives, or home at the time of these new onset symptoms, and able to contact EMS who brought the patient to [facility] emergency department for further evaluation management thereof.  These symptoms resolved in route to the hospital, without any residual or additional acute focal neurologic deficits.  Specifically, the patient denies any associated acute focal numbness, paresthesias, dysphagia, dizziness, vertigo, nausea, vomiting, acute change in vision, diplopia, facial droop or headache.  Denies any associated chest pain, shortness of breath, palpitations, diaphoresis, presyncope, or syncope.  Denies any previous history of stroke. Medical/social history notable for essential hypertension along with paroxysmal atrial fibrillation, but the patient acknowledging suboptimal compliance with her home Eliquis which she reports that she occasionally misses a dose of this chronic anticoagulant.  Her outpatient AV nodal blocking regimen consists of metoprolol succinate as well as diltiazem 240 mg p.o. daily.  She also notes that she occasionally misses doses of these APML  blocking agents, stating that she missed her dose of metoprolol succinate on 08/06/2021.  Denies any known history of diabetes, hyperlipidemia, obstructive sleep apnea, and conveys that she is a former smoker. Not currently taking a statin.    ED Course:  Vital signs in the ED were notable for the following: Afebrile; initial heart rate 121, which is decreased into the 80s to 90s following single dose of IV Lopressor; blood pressure 131/90; respiratory rate 17-22, oxygen saturation 95 to 97% on room air.  Labs were notable for the following: CMP notable for the following: Sodium 141, potassium 3.4, creatinine 1.14, glucose 124.  High-sensitivity troponin I initially noted to be 9, previous value trending down to 8.  CBC notable for white cell count 8700, hemoglobin 14.9.  Serum magnesium level 1.9.  Imaging and additional notable ED work-up: EKG showed atrial fibrillation with RVR, heart rate 121, nonspecific T wave inversion in aVL, as well as nonspecific less than 1 mm ST depression in V6, without any evidence of ST elevation.  Noncontrast CT head showed no evidence of acute intracranial process We will CTA head and neck showed no evidence of large vessel occlusion.  EDP discussed patient's case and imaging with the on-call neurologist, Dr.  Rory Percy, who recommended admission to the hospitalist service for further evaluation/management of concern for TIA versus acute ischemic CVA, including pursuit of MRI brain, as well as further assessment of potential modifiable acute ischemic CVA risk factors.  Neurology to formally consult, with additional recommendations to follow.  While in the ED, the following were administered: Lopressor 5 mg IV x1, potassium chloride 40 mg p.o. x1, normal saline x500 cc bolus.  Subsequently, the patient was admitted for overnight observation for further stroke work-up.  Review of Systems: As per HPI otherwise 10 point review of systems negative.   Past Medical  History:  Diagnosis Date   GERD (gastroesophageal reflux disease)    HTN (hypertension)    Paroxysmal atrial fibrillation (HCC)    On Eliquis    History reviewed. No pertinent surgical history.  Social History:  has no history on file for tobacco use, alcohol use, and drug use.   Not on File  History reviewed. No pertinent family history.  Family history reviewed and not pertinent    Prior to Admission medications   Medication Sig Start Date End Date Taking? Authorizing Provider  [facility] ASPIRIN ADULT LOW DOSE 81 MG chewable tablet Chew 81 mg by mouth daily. 04/20/21  Yes [provider]  DILT-XR 240 MG 24 hr capsule Take 240 mg by mouth daily. 06/11/21  Yes [provider]  ELIQUIS 5 MG TABS tablet Take 5 mg by mouth 2 (two) times daily. 06/06/21  Yes [provider]  loratadine (CLARITIN) 10 MG tablet Take 10 mg by mouth 2 (two) times daily. 06/14/21  Yes [provider]  losartan-hydrochlorothiazide (HYZAAR) 50-12.5 MG tablet Take 1 tablet by mouth daily. 05/12/21  Yes [provider]  metoprolol succinate (TOPROL-XL) 50 MG 24 hr tablet Take 50 mg by mouth daily. 06/11/21  Yes [provider]  omeprazole (PRILOSEC) 20 MG capsule Take 20 mg by mouth daily as needed (reflux). 03/16/21  Yes [provider]     Objective    Physical Exam: Vitals:   08/07/21 0245 08/07/21 0415 08/07/21 0615 08/07/21 0630  BP: 125/66 (!) 143/88  (!) 139/96  Pulse: 80 98 97 99  Resp: 20 (!) '22 17 20  '$ Temp:      TempSrc:      SpO2: 97% 95% 93% 92%  Weight:      Height:        General: appears to be stated age; alert, oriented Skin: warm, dry, no rash Head:  AT/[name] Mouth:  Oral mucosa membranes appear moist, normal dentition Neck: supple; trachea midline Heart: Irregular, but rate controlled; did not appreciate any M/R/G Lungs: CTAB, did not appreciate any wheezes, rales, or rhonchi Abdomen: + BS; soft, ND, NT Vascular: 2+ pedal  pulses b/l; 2+ radial pulses b/l Extremities: no peripheral edema, no muscle wasting Neuro: Neuro: 5/5 strength of the proximal and distal flexors and extensors of the upper and lower extremities bilaterally; sensation intact in upper and lower extremities b/l; cranial nerves II through XII grossly intact; no pronator drift; no evidence suggestive of slurred speech, dysarthria, or facial droop; Normal muscle tone. No tremors.    Labs on Admission: I have personally reviewed following labs and imaging studies  CBC: Recent Labs  Lab 08/07/21 0006 08/07/21 0015 08/07/21 0330  WBC 8.7  --  7.5  NEUTROABS 3.5  --  4.6  HGB 14.9 15.0 14.2  HCT 44.3 44.0 40.8  MCV 96.5  --  95.8  PLT 185  --  932   Basic Metabolic Panel: Recent Labs  Lab 08/07/21 0006 08/07/21 0015 08/07/21 0131 08/07/21 0330  NA 141 141  --  140  K 3.4* 3.3*  --  3.7  CL 107 105  --  110  CO2 25  --   --  23  GLUCOSE 124* 124*  --  120*  BUN 18 19  --  17  CREATININE 1.15* 1.20*  --  1.10*  CALCIUM 9.5  --   --  9.0  MG  --   --  1.9 1.9   GFR: Estimated Creatinine Clearance: 34.8 mL/min (A) (by C-G formula based on SCr of 1.1 mg/dL (H)). Liver Function Tests: Recent Labs  Lab 08/07/21 0006 08/07/21 0330  AST 32 28  ALT 28 27  ALKPHOS 84 80  BILITOT 0.6 0.6  PROT 7.3 6.5  ALBUMIN 3.6 3.2*   No results for input(s): LIPASE, AMYLASE in the last 168 hours. No results for input(s): AMMONIA in the last 168 hours. Coagulation Profile: Recent Labs  Lab 08/07/21 0006  INR 1.1   Cardiac Enzymes: Recent Labs  Lab 08/07/21 0131  CKTOTAL 76   BNP (last 3 results) No results for input(s): PROBNP in the last 8760 hours. HbA1C: Recent Labs    08/07/21 0330  HGBA1C 5.7*   CBG: No results for input(s): GLUCAP in the last 168 hours. Lipid Profile: Recent Labs    08/07/21 0241  CHOL 153  HDL 43  LDLCALC 90  TRIG 98  CHOLHDL 3.6   Thyroid Function Tests: No results for input(s): TSH,  T4TOTAL, FREET4, T3FREE, THYROIDAB in the last 72 hours. Anemia Panel: No results for input(s): VITAMINB12, FOLATE, FERRITIN, TIBC, IRON, RETICCTPCT in the last 72 hours. Urine analysis:    Component Value Date/Time   COLORURINE STRAW (A) 08/07/2021 0240   APPEARANCEUR CLEAR 08/07/2021 0240   LABSPEC 1.009 08/07/2021 0240   PHURINE 7.0 08/07/2021 0240   GLUCOSEU NEGATIVE 08/07/2021 0240   HGBUR NEGATIVE 08/07/2021 0240   BILIRUBINUR NEGATIVE 08/07/2021 0240   KETONESUR NEGATIVE 08/07/2021 0240   PROTEINUR NEGATIVE 08/07/2021 0240   NITRITE NEGATIVE 08/07/2021 0240   LEUKOCYTESUR NEGATIVE 08/07/2021 0240    Radiological Exams on Admission: CT ANGIO HEAD NECK W WO CM  Result Date: 08/07/2021 CLINICAL DATA:  Transient ischemic attack EXAM: CT ANGIOGRAPHY HEAD AND NECK TECHNIQUE: Multidetector CT imaging of the head and neck was performed using the standard protocol during bolus administration of intravenous contrast. Multiplanar CT image reconstructions and MIPs were obtained to evaluate the vascular anatomy. Carotid stenosis measurements (when applicable) are obtained utilizing NASCET criteria, using the distal internal carotid diameter as the denominator. RADIATION DOSE REDUCTION: This exam was performed according to the departmental dose-optimization program which includes automated exposure control, adjustment of the mA and/or kV according to patient size and/or use of iterative reconstruction technique. CONTRAST:  53m OMNIPAQUE IOHEXOL 300 MG/ML  SOLN COMPARISON:  None Available. FINDINGS: CTA NECK FINDINGS SKELETON: There is no bony spinal canal stenosis. No lytic or blastic lesion. OTHER NECK: There is a right parotid mass that measures 1.4 cm. UPPER CHEST: No pneumothorax or pleural effusion. No nodules or masses. AORTIC ARCH: There is calcific atherosclerosis of the aortic arch. There is no aneurysm, dissection or hemodynamically significant stenosis of the visualized portion of the  aorta. Conventional 3 vessel aortic branching pattern. The visualized proximal subclavian arteries are widely patent. RIGHT CAROTID SYSTEM: Normal without aneurysm, dissection or stenosis. LEFT CAROTID SYSTEM: Normal without aneurysm, dissection or stenosis. VERTEBRAL ARTERIES: Left dominant configuration. Both origins are clearly patent. There is no dissection, occlusion or flow-limiting stenosis to the skull base (V1-V3 segments). CTA HEAD FINDINGS POSTERIOR CIRCULATION: --Vertebral arteries: Normal V4 segments. --Inferior cerebellar arteries: Normal. --Basilar artery: Normal. --Superior cerebellar arteries: Normal. --Posterior cerebral arteries (PCA): Normal. ANTERIOR CIRCULATION: --Intracranial internal carotid arteries: Normal. --Anterior cerebral arteries (ACA): Normal. Both A1 segments are present. Patent anterior communicating artery (a-comm). --Middle cerebral arteries (MCA): Normal. VENOUS SINUSES: As permitted by contrast timing, patent.  ANATOMIC VARIANTS: None Review of the MIP images confirms the above findings. IMPRESSION: 1. No emergent large vessel occlusion or high-grade stenosis of the intracranial or cervical arteries. 2. 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 Histologic sampling should be considered, as the imaging features of benign and malignant parotid neoplasms overlap considerably. 3. Aortic Atherosclerosis (ICD10-I70.0). Electronically Signed   By: Ulyses Jarred M.D.   On: 08/07/2021 02:36   MR BRAIN WO CONTRAST  Result Date: 08/07/2021 CLINICAL DATA:  88 year old female with altered mental status, left gaze preference. Code stroke presentation. EXAM: MRI HEAD WITHOUT CONTRAST TECHNIQUE: Multiplanar, multiecho pulse sequences of the brain and surrounding structures were obtained without intravenous contrast. COMPARISON:  Head CT 0013 hours and CTA head and neck 0218 hours today. FINDINGS: Brain: No convincing diffusion restriction. No midline shift, mass effect, evidence of mass  lesion, ventriculomegaly, extra-axial collection or acute intracranial hemorrhage. Cervicomedullary junction and pituitary are within normal limits. Scattered but generally mild for age cerebral white matter T2 and FLAIR hyperintensity in both hemispheres. No cortical encephalomalacia identified. Chronic microhemorrhage in the posterior left internal capsule. But no other chronic cerebral blood products identified. Deep gray nuclei, brainstem and cerebellum appear within normal limits. Vascular: Major intracranial vascular flow voids are preserved. Skull and upper cervical spine: Widespread upper cervical spine degeneration with at least mild multilevel spinal stenosis C3-C4 and C4-C5 on series 9, image 11. Visualized bone marrow signal is within normal limits. Sinuses/Orbits: Postoperative changes to both globes, otherwise negative. Other: Mastoids are clear. Grossly normal visible internal auditory structures Right parotid space nodule as seen on CTA earlier today is primarily T2 hyperintense, with evidence of a small fluid level (series 10, image 1). It measures up to 2 cm long axis, with no evidence of hypercellularity on DWI. 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 IMPRESSION: 1. No convincing acute infarct or acute intracranial abnormality. 2. Largely normal for age noncontrast MRI appearance of the brain; solitary chronic microhemorrhage in the left hemisphere and mild nonspecific white matter signal changes. 3. 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 4. Right parotid gland nodule seen on earlier CTA has benign MRI characteristics. Electronically Signed   By: Genevie Ann M.D.   On: 08/07/2021 05:25   DG Chest Portable 1 View  Result Date: 08/07/2021 CLINICAL DATA:  Shortness of breath. EXAM: PORTABLE CHEST 1 VIEW COMPARISON:  Chest radiograph dated 08/16/2006. FINDINGS: There is diffuse chronic interstitial coarsening and bronchitic changes. There is left  lung base atelectasis. No consolidative changes. There is no pleural effusion pneumothorax. Mild cardiomegaly. Atherosclerotic calcification of the aorta. No acute osseous pathology. IMPRESSION: No acute cardiopulmonary process. Electronically Signed   By: Anner Crete M.D.   On: 08/07/2021 02:55   CT HEAD CODE STROKE WO CONTRAST  Result Date: 08/07/2021 CLINICAL DATA:  Code stroke.  Acute neurologic deficit EXAM: CT HEAD WITHOUT CONTRAST TECHNIQUE: Contiguous axial images were obtained from the base of the skull through the vertex without intravenous contrast. RADIATION DOSE REDUCTION: This exam was performed according to the departmental dose-optimization program which includes automated exposure control, adjustment of the mA and/or kV according to patient size and/or use of iterative reconstruction technique. COMPARISON:  None Available. FINDINGS: Brain: There is no mass, hemorrhage or extra-axial collection. The size and configuration of the ventricles and extra-axial CSF spaces are normal. There is hypoattenuation of the periventricular white matter, most commonly indicating chronic ischemic microangiopathy. Vascular: No abnormal hyperdensity of the major intracranial  arteries or dural venous sinuses. No intracranial atherosclerosis. Skull: The visualized skull base, calvarium and extracranial soft tissues are normal. Sinuses/Orbits: No fluid levels or advanced mucosal thickening of the visualized paranasal sinuses. No mastoid or middle ear effusion. The orbits are normal. ASPECTS [facility] Stroke Program Early CT Score) - Ganglionic level infarction (caudate, lentiform nuclei, internal capsule, insula, M1-M3 cortex): 7 - Supraganglionic infarction (M4-M6 cortex): 3 Total score (0-10 with 10 being normal): 10 IMPRESSION: 1. No acute intracranial abnormality. 2. ASPECTS is 10. These results were called by telephone at the time of interpretation on 08/07/2021 at 12:22 am to provider [facility] , who  verbally acknowledged these results. Electronically Signed   By: Ulyses Jarred M.D.   On: 08/07/2021 00:23     EKG: Independently reviewed, with result as described above.    Assessment/Plan   Principal Problem:   TIA (transient ischemic attack) Active Problems:   HTN (hypertension)   Paroxysmal atrial fibrillation (HCC)   Hypokalemia    #) Strokelike symptoms: Sudden onset of dysarthria and left hemiparesis at 2300 with ensuing spontaneous resolution without any residual, recurrent or additional acute focal neurologic deficits, with CT head showed no evidence of acute intracranial process, will CTA head and neck showed no evidence of large vessel occlusion.  Patient's case and imaging d/w  the on-call neurologist, Dr.  Rory Percy , who recommended admission to the hospitalist service for further evaluation/management of concern for TIA versus acute ischemic CVA, including pursuit of MRI brain, as well as further assessment of potential modifiable acute ischemic CVA risk factors.  Neurology to formally consult, with additional recommendations to follow.   Of note, the patient reportedly possesses multiple modifiable CVA risk factors including a history of paroxysmal atrial fibrillation for which he acknowledges suboptimal compliance on her chronic anticoagulation with Eliquis, also acknowledging a history of essential hypertension.  Denies any known history of hyperlipidemia, obstructive sleep apnea, diabetes, and acknowledges that she is a former smoker.  Presenting EKG showed atrial fibrillation with RVR, with ensuing improvement in rate control following single dose of IV Lopressor, consistent with the patient's report of having missed her dose of metoprolol succinate earlier in the day.  Patient is not a candidate for TPA administration as the patient's symptoms completely/spontaneously resolved. Current outpatient anti-lipid regimen: .   Will allow for permissive hypertension for 24-48 hours  following onset of acute focal neurologic deficits, with associated parameters further quantified below, and with period of observance of permissive hypertension to end at 2300 on 08/08/2021.    Plan: Nursing bedside swallow evaluation x 1 now, and will not initiate oral medications or diet until the patient has passed this. Head of the bed at 30 degrees. Neuro checks per protocol. VS per protocol. Will allow for permissive hypertension for 24-48 hours following onset of acute focal neurologic deficits, during which will hold home antihypertensive medications, with the exception of home AV nodal blocking agents.  MRI brain.   TTE with out bubble study has been ordered for the morning. Check lipid panel and A1c. PT/OT/ST consults have been ordered to occur in the morning.  Continue home Eliquis, and emphasized the importance of improved compliance with this medication.  Neurology consulted, as above.Marland Kitchen         #) Paroxysmal atrial fibrillation: Documented history of such. In setting of CHA2DS2-VASc score of  4, there is an indication for chronic anticoagulation for thromboembolic prophylaxis. Consistent with this, patient is chronically anticoagulated on Eliquis, although with acknowledged suboptimal  compliance, as further detailed above. Home AV nodal blocking regimen: Metoprolol succinate and daily diltiazem.  Presenting EKG showed atrial fibrillation with RVR with initial ventricular rates in the low 120s, which improved following dose of IV Lopressor, consistent with the patient's report of having missed her dose of metoprolol succinate yesterday.  Of note, presenting serum potassium low 3.4 status post 40 mill equivalents p.o. potassium chloride in the ED.  Also, serum magnesium level 1.9.  Will optimize with 2 g of IV magnesium sulfate, as below.   Plan: monitor strict I's & O's and daily weights. Repeat BMP/CBC in AM.  Magnesium sulfate 2 g IV every 2 hours x1 dose now, with repeat serum magnesium  level in the morning.  Continue home AV nodal blocking regimen with next dose of metoprolol succinate to occur now, as the patient conveys that she missed her dose yesterday..  Continue home Eliquis, with counseling of the patient on the importance of improved compliance with this medication.  Monitor on telemetry.       #) Hypokalemia: Presenting serum potassium level 3.4, notable in the setting of initial atrial fibrillation with RVR.  Status post 40 mill equivalents of oral potassium chloride in the ED.  Plan: Monitor on telemetry.  Repeat BMP in the morning.  Magnesium sulfate 2 g IV over 2 hours x 1 dose now, with repeat serum magnesium level in the morning.          #) Essential Hypertension: documented h/o such, with outpatient antihypertensive regimen including HCTZ, losartan, daily diltiazem, metoprolol succinate.  SBP's in the ED today: Normotensive.  will pursue permissive hypertension until 2300 on 08/08/2021 in the setting of suspected presenting TIA/stroke, as further detailed above.  Consequently, will hold home HCTZ and losartan over that timeframe, but continue home AV nodal blocking agents in the setting of paroxysmal atrial fibrillation with initial RVR.  Plan: Close monitoring of subsequent BP via routine VS. holding home losartan and HCTZ for now, as above.  Continue home AV nodal blocking agents.  Permissive hypertension, as above.     DVT prophylaxis: SCD's + home Eliquis Code Status: Full code Family Communication: I discussed the patient's case with her daughter, who is present at bedside Disposition Plan: Per Rounding Team Consults called: Case discussed with on-call neurology, Dr Rory Percy, as further detailed above;  Admission status: Observation    PLEASE NOTE THAT DRAGON DICTATION SOFTWARE WAS USED IN THE CONSTRUCTION OF THIS NOTE.   [place] DO [facility]  From [place]   08/07/2021, 6:55 AM

## 2021-08-07 NOTE — ED Notes (Addendum)
Pt's daughter Judeen Hammans) would like to be contacted with any information about pt's care, specifically if a MD communicates patients plan of care. Daughter's number is in pt's chart.

## 2021-08-07 NOTE — Progress Notes (Addendum)
STROKE TEAM PROGRESS NOTE   INTERVAL HISTORY No family at the bedside. She was sitting watching TV and then noticed a buzzing in her ears. She states this happens when she eats sweets. Then she felt dizzy and noticed weakness. For the last couple of days she has only been taking her Eliquis once a day because of dryness.   Vitals:   08/07/21 0615 08/07/21 0630 08/07/21 0739 08/07/21 0740  BP:  (!) 139/96 (!) 139/102   Pulse: 97 99 92   Resp: '17 20 14   '$ Temp:    97.7 F (36.5 C)  TempSrc:    Oral  SpO2: 93% 92% 95%   Weight:      Height:       CBC:  Recent Labs  Lab 08/07/21 0006 08/07/21 0015 08/07/21 0330  WBC 8.7  --  7.5  NEUTROABS 3.5  --  4.6  HGB 14.9 15.0 14.2  HCT 44.3 44.0 40.8  MCV 96.5  --  95.8  PLT 185  --  030   Basic Metabolic Panel:  Recent Labs  Lab 08/07/21 0006 08/07/21 0015 08/07/21 0131 08/07/21 0330  NA 141 141  --  140  K 3.4* 3.3*  --  3.7  CL 107 105  --  110  CO2 25  --   --  23  GLUCOSE 124* 124*  --  120*  BUN 18 19  --  17  CREATININE 1.15* 1.20*  --  1.10*  CALCIUM 9.5  --   --  9.0  MG  --   --  1.9 1.9   Lipid Panel:  Recent Labs  Lab 08/07/21 0241  CHOL 153  TRIG 98  HDL 43  CHOLHDL 3.6  VLDL 20  LDLCALC 90   HgbA1c:  Recent Labs  Lab 08/07/21 0330  HGBA1C 5.7*   Urine Drug Screen: No results for input(s): LABOPIA, COCAINSCRNUR, LABBENZ, AMPHETMU, THCU, LABBARB in the last 168 hours.  Alcohol Level No results for input(s): ETH in the last 168 hours.  IMAGING past 24 hours CT ANGIO HEAD NECK W WO CM  Result Date: 08/07/2021 CLINICAL DATA:  Transient ischemic attack EXAM: CT ANGIOGRAPHY HEAD AND NECK TECHNIQUE: Multidetector CT imaging of the head and neck was performed using the standard protocol during bolus administration of intravenous contrast. Multiplanar CT image reconstructions and MIPs were obtained to evaluate the vascular anatomy. Carotid stenosis measurements (when applicable) are obtained utilizing  NASCET criteria, using the distal internal carotid diameter as the denominator. RADIATION DOSE REDUCTION: This exam was performed according to the departmental dose-optimization program which includes automated exposure control, adjustment of the mA and/or kV according to patient size and/or use of iterative reconstruction technique. CONTRAST:  58m OMNIPAQUE IOHEXOL 300 MG/ML  SOLN COMPARISON:  None Available. FINDINGS: CTA NECK FINDINGS SKELETON: There is no bony spinal canal stenosis. No lytic or blastic lesion. OTHER NECK: There is a right parotid mass that measures 1.4 cm. UPPER CHEST: No pneumothorax or pleural effusion. No nodules or masses. AORTIC ARCH: There is calcific atherosclerosis of the aortic arch. There is no aneurysm, dissection or hemodynamically significant stenosis of the visualized portion of the aorta. Conventional 3 vessel aortic branching pattern. The visualized proximal subclavian arteries are widely patent. RIGHT CAROTID SYSTEM: Normal without aneurysm, dissection or stenosis. LEFT CAROTID SYSTEM: Normal without aneurysm, dissection or stenosis. VERTEBRAL ARTERIES: Left dominant configuration. Both origins are clearly patent. There is no dissection, occlusion or flow-limiting stenosis to the skull base (V1-V3 segments).  CTA HEAD FINDINGS POSTERIOR CIRCULATION: --Vertebral arteries: Normal V4 segments. --Inferior cerebellar arteries: Normal. --Basilar artery: Normal. --Superior cerebellar arteries: Normal. --Posterior cerebral arteries (PCA): Normal. ANTERIOR CIRCULATION: --Intracranial internal carotid arteries: Normal. --Anterior cerebral arteries (ACA): Normal. Both A1 segments are present. Patent anterior communicating artery (a-comm). --Middle cerebral arteries (MCA): Normal. VENOUS SINUSES: As permitted by contrast timing, patent. ANATOMIC VARIANTS: None Review of the MIP images confirms the above findings. IMPRESSION: 1. No emergent large vessel occlusion or high-grade stenosis of  the intracranial or cervical arteries. 2. 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 Histologic sampling should be considered, as the imaging features of benign and malignant parotid neoplasms overlap considerably. 3. Aortic Atherosclerosis (ICD10-I70.0). Electronically Signed   By: Ulyses Jarred M.D.   On: 08/07/2021 02:36   MR BRAIN WO CONTRAST  Result Date: 08/07/2021 CLINICAL DATA:  88 year old female with altered mental status, left gaze preference. Code stroke presentation. EXAM: MRI HEAD WITHOUT CONTRAST TECHNIQUE: Multiplanar, multiecho pulse sequences of the brain and surrounding structures were obtained without intravenous contrast. COMPARISON:  Head CT 0013 hours and CTA head and neck 0218 hours today. FINDINGS: Brain: No convincing diffusion restriction. No midline shift, mass effect, evidence of mass lesion, ventriculomegaly, extra-axial collection or acute intracranial hemorrhage. Cervicomedullary junction and pituitary are within normal limits. Scattered but generally mild for age cerebral white matter T2 and FLAIR hyperintensity in both hemispheres. No cortical encephalomalacia identified. Chronic microhemorrhage in the posterior left internal capsule. But no other chronic cerebral blood products identified. Deep gray nuclei, brainstem and cerebellum appear within normal limits. Vascular: Major intracranial vascular flow voids are preserved. Skull and upper cervical spine: Widespread upper cervical spine degeneration with at least mild multilevel spinal stenosis C3-C4 and C4-C5 on series 9, image 11. Visualized bone marrow signal is within normal limits. Sinuses/Orbits: Postoperative changes to both globes, otherwise negative. Other: Mastoids are clear. Grossly normal visible internal auditory structures Right parotid space nodule as seen on CTA earlier today is primarily T2 hyperintense, with evidence of a small fluid level (series 10, image 1). It measures up to 2 cm long axis, with no evidence  of hypercellularity on DWI. 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 IMPRESSION: 1. No convincing acute infarct or acute intracranial abnormality. 2. Largely normal for age noncontrast MRI appearance of the brain; solitary chronic microhemorrhage in the left hemisphere and mild nonspecific white matter signal changes. 3. 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 4. Right parotid gland nodule seen on earlier CTA has benign MRI characteristics. Electronically Signed   By: Genevie Ann M.D.   On: 08/07/2021 05:25   DG Chest Portable 1 View  Result Date: 08/07/2021 CLINICAL DATA:  Shortness of breath. EXAM: PORTABLE CHEST 1 VIEW COMPARISON:  Chest radiograph dated 08/16/2006. FINDINGS: There is diffuse chronic interstitial coarsening and bronchitic changes. There is left lung base atelectasis. No consolidative changes. There is no pleural effusion pneumothorax. Mild cardiomegaly. Atherosclerotic calcification of the aorta. No acute osseous pathology. IMPRESSION: No acute cardiopulmonary process. Electronically Signed   By: Anner Crete M.D.   On: 08/07/2021 02:55   CT HEAD CODE STROKE WO CONTRAST  Result Date: 08/07/2021 CLINICAL DATA:  Code stroke.  Acute neurologic deficit EXAM: CT HEAD WITHOUT CONTRAST TECHNIQUE: Contiguous axial images were obtained from the base of the skull through the vertex without intravenous contrast. RADIATION DOSE REDUCTION: This exam was performed according to the departmental dose-optimization program which includes automated exposure control, adjustment of the mA  and/or kV according to patient size and/or use of iterative reconstruction technique. COMPARISON:  None Available. FINDINGS: Brain: There is no mass, hemorrhage or extra-axial collection. The size and configuration of the ventricles and extra-axial CSF spaces are normal. There is hypoattenuation of the periventricular white matter, most commonly indicating chronic ischemic  microangiopathy. Vascular: No abnormal hyperdensity of the major intracranial arteries or dural venous sinuses. No intracranial atherosclerosis. Skull: The visualized skull base, calvarium and extracranial soft tissues are normal. Sinuses/Orbits: No fluid levels or advanced mucosal thickening of the visualized paranasal sinuses. No mastoid or middle ear effusion. The orbits are normal. ASPECTS [facility] Stroke Program Early CT Score) - Ganglionic level infarction (caudate, lentiform nuclei, internal capsule, insula, M1-M3 cortex): 7 - Supraganglionic infarction (M4-M6 cortex): 3 Total score (0-10 with 10 being normal): 10 IMPRESSION: 1. No acute intracranial abnormality. 2. ASPECTS is 10. These results were called by telephone at the time of interpretation on 08/07/2021 at 12:22 am to provider [facility] , who verbally acknowledged these results. Electronically Signed   By: Ulyses Jarred M.D.   On: 08/07/2021 00:23    PHYSICAL EXAM  Physical Exam  Constitutional: Appears well-developed and well-nourished.  Cardiovascular: Normal rate and regular rhythm.  Respiratory: Effort normal, non-labored breathing  Neuro: Mental Status: Patient is awake, alert, oriented to person, place, month, year, and situation. Patient is able to give a clear and coherent history. No signs of aphasia or neglect Cranial Nerves: II: Visual Fields are full. Pupils are equal, round, and reactive to light.   III,IV, VI: EOMI without ptosis or diploplia.  V: Facial sensation is symmetric to temperature VII: Facial movement is symmetric resting and smiling VIII: Hearing is intact to voice X: Palate elevates symmetrically XI: Shoulder shrug is symmetric. XII: Tongue protrudes midline without atrophy or fasciculations.  Motor: Tone is normal. Bulk is normal. 5/5 strength was present in all four extremities.  Sensory: Sensation is symmetric to light touch and temperature in the arms and legs. No extinction to DSS present.   Cerebellar: FNF and HKS are intact bilaterally    ASSESSMENT/PLAN Ms. Tamaka Sawin is a 88 y.o. female with history of Afib on elquis, HTN, obesity presenting with AMS, confusion, left side weakness, left gaze preference. Symptoms resolved in route to the hospital. Recommend starting PCSK9 inhibitor outpatient since she does not tolerate statins. Eliquis '5mg'$  BID, home health PT/OT.    TIA  Code Stroke CT head No acute abnormality.  CTA head & neck Aortic atherosclerosis, no LVO MRI  chronic microhemorrhage in the left hemisphere and mild nonspecific white matter signal changes 2D Echo- Pending LDL 90 HgbA1c 5.7 VTE prophylaxis - Eliquis '5mg'$  BID    Diet   Diet Heart Room service appropriate? Yes; Fluid consistency: Thin   Eliquis (apixaban) daily prior to admission, now on Eliquis (apixaban) daily.  Therapy recommendations:  Home health PT Disposition:    Atrial Fibrillation Home meds: Eliquis '5mg'$  BID, Dilt-XR '240mg'$  Only taking Eliquis once per day  Hypertension Home meds:  Losartan-hydrochlorothiazide Stable Permissive hypertension (OK if < 220/120) but gradually normalize in 5-7 days Long-term BP goal normotensive  Hyperlipidemia Home meds:  None LDL 90, goal < 70 Add Zetia- doesn't want to take a statin, has had muscle pain in the past Continue statin at discharge  Other Stroke Risk Factors Advanced Age >/= 69  Obesity, Body mass index is 35.31 kg/m., BMI >/= 30 associated with increased stroke risk, recommend weight loss, diet and exercise as appropriate  Other Active Problems GERD- prilosec  Hospital day # 0  Patient seen and examined by NP/APP with MD. MD to update note as needed.   Janine Ores, DNP, FNP-BC [facility] Pager: [phone]  ATTENDING ATTESTATION:  88 year old with what appears to be a TIA.  Her MRI is negative for CVA.  CT is negative.  EEG is pending Long discussion of the importance of taking Eliquis twice a day as  prescribed and the importance of starting a medication to control LDL.  She is at high risk for recurrent CVA and understands this especially in light of not taking medications as prescribed.  She is agreeable to starting Zetia and speaking with PCP about PCSK9 inhibitor like repatha.   Neurology will sign off please call if EEG is abnormal.  Dr. Reeves Forth evaluated pt independently, reviewed imaging, chart, labs. Discussed and formulated plan with the APP. Please see APP note above for details.   Total 36 minutes spent on counseling patient and coordinating care, writing notes and reviewing chart.  Emony Dormer,MD    To contact Stroke Continuity provider, please refer to [email or web address] After hours, contact General Neurology

## 2021-08-07 NOTE — Evaluation (Signed)
Physical Therapy Evaluation Patient Details Name: Morgan Jacobs MRN: [id number] DOB: [birth year] Today's Date: 08/07/2021  History of Present Illness  Pt is an 88 y/o female admitted secondary to slurred speech and L weakness. MRI negative. PMH includes HTN and a fib.  Clinical Impression  Pt admitted secondary to problem above with deficits below. Pt requiring min guard A for mobility tasks. Mild instability and weakness noted. Educated about use of DME at home to increase safety. Recommending HHPT at d/c. Will continue to follow acutely.        Recommendations for follow up therapy are one component of a multi-disciplinary discharge planning process, led by the attending physician.  Recommendations may be updated based on patient status, additional functional criteria and insurance authorization.  Follow Up Recommendations Home health PT    Assistance Recommended at Discharge Intermittent Supervision/Assistance  Patient can return home with the following  Help with stairs or ramp for entrance;Assistance with cooking/housework;Assist for transportation    Equipment Recommendations None recommended by PT  Recommendations for Other Services       Functional Status Assessment Patient has had a recent decline in their functional status and demonstrates the ability to make significant improvements in function in a reasonable and predictable amount of time.     Precautions / Restrictions Precautions Precautions: Fall Restrictions Weight Bearing Restrictions: No      Mobility  Bed Mobility Overal bed mobility: Modified Independent                  Transfers Overall transfer level: Needs assistance Equipment used: None Transfers: Sit to/from Stand Sit to Stand: Min guard           General transfer comment: Min guard for safety to stand from stretcher    Ambulation/Gait Ambulation/Gait assistance: Min guard Gait Distance (Feet): 75 Feet Assistive device: None Gait  Pattern/deviations: Step-through pattern, Decreased stride length Gait velocity: Decreased     General Gait Details: Min guard for safety. Increased fatigue and mild unsteadiness. Educated about using DME to increase safety.  Stairs            Wheelchair Mobility    Modified Rankin (Stroke Patients Only)       Balance Overall balance assessment: Needs assistance Sitting-balance support: No upper extremity supported, Feet supported Sitting balance-Leahy Scale: Good     Standing balance support: No upper extremity supported Standing balance-Leahy Scale: Fair                               Pertinent Vitals/Pain Pain Assessment Pain Assessment: No/denies pain    Home Living Family/patient expects to be discharged to:: Private residence Living Arrangements: Children Available Help at Discharge: Family Type of Home: House Home Access: Stairs to enter Entrance Stairs-Rails: Right Entrance Stairs-Number of Steps: 2   Home Layout: One level Home Equipment: Advice worker (2 wheels);Cane - single point Additional Comments: Reports she plans to stay with her daughter.    Prior Function Prior Level of Function : Independent/Modified Independent             Mobility Comments: Has DME, but doesnt use ADLs Comments: Reports independence     Hand Dominance        Extremity/Trunk Assessment   Upper Extremity Assessment Upper Extremity Assessment: Defer to OT evaluation    Lower Extremity Assessment Lower Extremity Assessment: Generalized weakness    Cervical / Trunk Assessment Cervical / Trunk Assessment: Normal  Communication   Communication: No difficulties  Cognition Arousal/Alertness: Awake/alert Behavior During Therapy: WFL for tasks assessed/performed Overall Cognitive Status: No family/caregiver present to determine baseline cognitive functioning                                          General Comments       Exercises     Assessment/Plan    PT Assessment Patient needs continued PT services  PT Problem List Decreased strength;Decreased activity tolerance;Decreased balance;Decreased mobility;Decreased knowledge of use of DME       PT Treatment Interventions DME instruction;Gait training;Functional mobility training;Stair training;Therapeutic exercise;Therapeutic activities;Balance training;Patient/family education    PT Goals (Current goals can be found in the Care Plan section)  Acute Rehab PT Goals Patient Stated Goal: to gohome PT Goal Formulation: With patient Time For Goal Achievement: 08/21/21 Potential to Achieve Goals: Good    Frequency Min 3X/week     Co-evaluation               AM-PAC PT "6 Clicks" Mobility  Outcome Measure Help needed turning from your back to your side while in a flat bed without using bedrails?: None Help needed moving from lying on your back to sitting on the side of a flat bed without using bedrails?: None Help needed moving to and from a bed to a chair (including a wheelchair)?: A Little Help needed standing up from a chair using your arms (e.g., wheelchair or bedside chair)?: A Little Help needed to walk in hospital room?: A Little Help needed climbing 3-5 steps with a railing? : A Little 6 Click Score: 20    End of Session Equipment Utilized During Treatment: Gait belt Activity Tolerance: Patient tolerated treatment well Patient left: in bed;with call bell/phone within reach Nurse Communication: Mobility status PT Visit Diagnosis: Unsteadiness on feet (R26.81);Muscle weakness (generalized) (M62.81)    Time: 2229-7989 PT Time Calculation (min) (ACUTE ONLY): 18 min   Charges:   PT Evaluation $PT Eval Low Complexity: 1 Low          Lou Miner, DPT  Acute Rehabilitation Services  Pager: [phone] Office: [phone]   Rudean Hitt 08/07/2021, 7:56 AM

## 2021-08-07 NOTE — ED Notes (Signed)
Pt has used BSC multiple times throughout the night with minimal assistance. Pt ambulated in room with steady gait and had no complaints.

## 2021-08-07 NOTE — Plan of Care (Signed)

## 2021-08-07 NOTE — TOC Initial Note (Signed)
Transition of Care [facility]) - Initial/Assessment Note    Patient Details  Name: Morgan Jacobs MRN: [id number] Date of Birth: [birth year]  [facility] of [facility] CM/SW Contact:    Verdell Carmine, RN Phone Number: 08/07/2021, 10:24 AM  Clinical Narrative:                 Patient in with stroke like symptoms. She lives at home by self family over most all the time, or she is at other family's house.  She has a can e and walker at home. She lives independently and still drives.  Patient declining home health at this time. States she has all DME needed.  TOC will follow for further needs, recommendations, and transitions  Expected Discharge Plan: Home/Self Care Barriers to Discharge: Continued Medical Work up   Patient Goals and CMS Choice        Expected Discharge Plan and Services Expected Discharge Plan: Home/Self Care   Discharge Planning Services: CM Consult   Living arrangements for the past 2 months: Single Family Home                                      Prior Living Arrangements/Services Living arrangements for the past 2 months: Single Family Home Lives with:: Self (family close by)              Current home services: DME (Has cane and walker)    Activities of Daily Living      Permission Sought/Granted                  Emotional Assessment Appearance:: Appears older than stated age Attitude/Demeanor/Rapport: Gracious Affect (typically observed): Stable Orientation: : Oriented to Self, Oriented to Place, Oriented to  Time, Oriented to Situation Alcohol / Substance Use: Not Applicable Psych Involvement: No (comment)  Admission diagnosis:  TIA (transient ischemic attack) [G45.9] Patient Active Problem List   Diagnosis Date Noted   TIA (transient ischemic attack) 08/07/2021   Parotid mass 08/07/2021   Stage 3a chronic kidney disease (CKD) ([place]) 08/07/2021   HTN (hypertension)    Paroxysmal atrial fibrillation (HCC)    Hypokalemia     PCP:  Cyndi Bender, PA-C Pharmacy:   [id number] [place], [id number]: [phone]: [phone]    Social Determinants of Health (SDOH) Interventions    Readmission Risk Interventions     View : No data to display.

## 2021-08-07 NOTE — ED Notes (Signed)
Pt was assisted to bedside commode without difficulty and returned to bed. Pt alert and oriented, strength equal bilaterally. Stroke swallow screen test preformed, pt passed without issue. MD Pearline Cables notified of completion.

## 2021-08-07 NOTE — Assessment & Plan Note (Addendum)
MRI brain without infarct.  Non-invasive angiography showed no significant atherosclerosis, normal carotids.  Echocardiogram showed no cardiogenic source of embolism  -Lipids ordered: LDL 90, atorvastatin started -Aspirin deferred given Eliquis -Atrial fibrillation: preiovusly diagnosed, not taking apixaban daily -tPA not given because symptoms resolved -Dysphagia screen ordered in ER -PT eval ordered: recommended HHPT -Smoking cessation: not applicable

## 2021-08-07 NOTE — Code Documentation (Signed)
Stroke Response Nurse Documentation Code Documentation  Morgan Jacobs is a 88 y.o. female arriving to [facility]  via [facility] on 5/21 with past medical hx of afib, HTN, obesity. On Eliquis (apixaban) daily. Code stroke was activated by EMS.   Patient from home where she was LKW at 2300 and now complaining of left sided weakness.  Stroke team at the bedside on patient arrival. Labs drawn and patient cleared for CT by Dr. Dayna Barker. Patient to CT with team. NIHSS 0, see documentation for details and code stroke times. Patient with no neurological deficits on exam. The following imaging was completed:  CT Head. Patient is not a candidate for IV Thrombolytic due to resolved symptoms. Patient is not a candidate for IR due to No LVO sypmtoms.   Care Plan: TIA alert.   Bedside handoff with ED RN Bryson Ha.    Madelynn Done  Rapid Response RN

## 2021-08-07 NOTE — Progress Notes (Signed)
EEG complete - results pending 

## 2021-08-07 NOTE — Discharge Summary (Signed)
Physician Discharge Summary   Patient: Morgan Jacobs MRN: [id number] DOB: Aug 04, 1933  Admit date:     08/07/2021  Discharge date: 08/07/21  Discharge Physician: Edwin Dada   PCP: Cyndi Bender, PA-C     Recommendations at discharge:  Follow up with PCP Cyndi Bender in 1 week Cyndi Bender: Please see below regarding incidental parotid mass, and refer to ENT if no prior work up done Follow up with [facility] for new TIA Consider starting statin or consider Repatha depending on patient goals of care     Discharge Diagnoses: Principal Problem:   TIA (transient ischemic attack) Active Problems:   HTN (hypertension)   Paroxysmal atrial fibrillation (HCC)   Hypokalemia   Parotid mass   Stage 3a chronic kidney disease (CKD) [facility])        Hospital Course: Morgan Jacobs is an 88 y.o. F with AF on Eliquis, CKD IIIa baseline 1.1, HTN, hypothyroidism and obesity who presented with left sided weakness and slurred speech.  Symptoms resolved by the time of her arrival to the ER.  Of note, is on Eliquis 5 mg twice daily but chooses to take only once a day for the past week because of some dry skin that she had been getting that she thought is because of Eliquis.     * TIA (transient ischemic attack) MRI brain without infarct.  Non-invasive angiography showed no significant atherosclerosis, normal carotids.  Echocardiogram showed no cardiogenic source of embolism  -Lipids ordered: LDL 90, high dose statin recommended, patient declined, started Zetia.  -Discharged back on apsirin and Eliquis -Atrial fibrillation: preiovusly diagnosed, not taking apixaban daily -tPA not given because symptoms resolved -Dysphagia screen ordered in ER -PT eval ordered: recommended HHPT -Smoking cessation: not applicable                 The [facility] Controlled Substances Registry was reviewed for this patient prior to discharge.   Consultants:  Neurology Procedures performed: CTA head and neck, MRI brain, echo  Disposition: Home with HH Diet recommendation:  Discharge Diet Orders (From admission, onward)     Start     Ordered   08/07/21 0000  Diet - low sodium heart healthy        08/07/21 1708             DISCHARGE MEDICATION: Allergies as of 08/07/2021   Not on File      Medication List     TAKE these medications    [facility] Aspirin Adult Low Dose 81 MG chewable tablet Generic drug: aspirin Chew 81 mg by mouth daily.   Dilt-XR 240 MG 24 hr capsule Generic drug: diltiazem Take 240 mg by mouth daily.   Eliquis 5 MG Tabs tablet Generic drug: apixaban Take 5 mg by mouth 2 (two) times daily.   ezetimibe 10 MG tablet Commonly known as: ZETIA Take 1 tablet (10 mg total) by mouth daily. Start taking on: Aug 08, 2021   loratadine 10 MG tablet Commonly known as: CLARITIN Take 10 mg by mouth 2 (two) times daily.   losartan-hydrochlorothiazide 50-12.5 MG tablet Commonly known as: HYZAAR Take 1 tablet by mouth daily.   metoprolol succinate 50 MG 24 hr tablet Commonly known as: TOPROL-XL Take 50 mg by mouth daily.   omeprazole 20 MG capsule Commonly known as: PRILOSEC Take 20 mg by mouth daily as needed (reflux).        Follow-up Information     Cyndi Bender, PA-C. Schedule an appointment  as soon as possible for a visit in 1 week(s).   Specialty: Physician Assistant Contact information: [place] [address] [phone]         [facility] Follow up.   Specialty: Neurology Contact information: [address] [place] [phone]                Discharge Instructions     Diet - low sodium heart healthy   Complete by: As directed    Discharge instructions   Complete by: As directed    From Dr. Loleta Books:  You were admitted for a mini-stroke or TIA Your MRI thankfully, showed the event was brief and  didn't cause lasting damage to the brain (this would be an *actual*) stroke  The CT of your head did show a possible mass in the right parotid, go see your primary care for a referral to ENT for this   Go see the Neurologist at [facility] for follow up of your TIA  Take the new cholesterol medicine Zetia (ezetimibe)  Take your Eliquis twice every day   Increase activity slowly   Complete by: As directed        Discharge Exam: Filed Weights   08/07/21 0020  Weight: 82 kg    General: Pt is alert, awake, not in acute distress Cardiovascular: RRR, nl S1-S2, no murmurs appreciated.   No LE edema.   Respiratory: Normal respiratory rate and rhythm.  CTAB without rales or wheezes. Abdominal: Abdomen soft and non-tender.  No distension or HSM.   Neuro/Psych: Strength symmetric in upper and lower extremities.  Judgment and insight appear normal.   Condition at discharge: good  The results of significant diagnostics from this hospitalization (including imaging, microbiology, ancillary and laboratory) are listed below for reference.   Imaging Studies: CT ANGIO HEAD NECK W WO CM  Result Date: 08/07/2021 CLINICAL DATA:  Transient ischemic attack EXAM: CT ANGIOGRAPHY HEAD AND NECK TECHNIQUE: Multidetector CT imaging of the head and neck was performed using the standard protocol during bolus administration of intravenous contrast. Multiplanar CT image reconstructions and MIPs were obtained to evaluate the vascular anatomy. Carotid stenosis measurements (when applicable) are obtained utilizing NASCET criteria, using the distal internal carotid diameter as the denominator. RADIATION DOSE REDUCTION: This exam was performed according to the departmental dose-optimization program which includes automated exposure control, adjustment of the mA and/or kV according to patient size and/or use of iterative reconstruction technique. CONTRAST:  46m OMNIPAQUE IOHEXOL 300 MG/ML  SOLN  COMPARISON:  None Available. FINDINGS: CTA NECK FINDINGS SKELETON: There is no bony spinal canal stenosis. No lytic or blastic lesion. OTHER NECK: There is a right parotid mass that measures 1.4 cm. UPPER CHEST: No pneumothorax or pleural effusion. No nodules or masses. AORTIC ARCH: There is calcific atherosclerosis of the aortic arch. There is no aneurysm, dissection or hemodynamically significant stenosis of the visualized portion of the aorta. Conventional 3 vessel aortic branching pattern. The visualized proximal subclavian arteries are widely patent. RIGHT CAROTID SYSTEM: Normal without aneurysm, dissection or stenosis. LEFT CAROTID SYSTEM: Normal without aneurysm, dissection or stenosis. VERTEBRAL ARTERIES: Left dominant configuration. Both origins are clearly patent. There is no dissection, occlusion or flow-limiting stenosis to the skull base (V1-V3 segments). CTA HEAD FINDINGS POSTERIOR CIRCULATION: --Vertebral arteries: Normal V4 segments. --Inferior cerebellar arteries: Normal. --Basilar artery: Normal. --Superior cerebellar arteries: Normal. --Posterior cerebral arteries (PCA): Normal. ANTERIOR CIRCULATION: --Intracranial internal carotid arteries: Normal. --Anterior cerebral arteries (  ACA): Normal. Both A1 segments are present. Patent anterior communicating artery (a-comm). --Middle cerebral arteries (MCA): Normal. VENOUS SINUSES: As permitted by contrast timing, patent. ANATOMIC VARIANTS: None Review of the MIP images confirms the above findings. IMPRESSION: 1. No emergent large vessel occlusion or high-grade stenosis of the intracranial or cervical arteries. 2. 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 Histologic sampling should be considered, as the imaging features of benign and malignant parotid neoplasms overlap considerably. 3. Aortic Atherosclerosis (ICD10-I70.0). Electronically Signed   By: Ulyses Jarred M.D.   On: 08/07/2021 02:36   MR BRAIN WO CONTRAST  Result Date: 08/07/2021 CLINICAL  DATA:  88 year old female with altered mental status, left gaze preference. Code stroke presentation. EXAM: MRI HEAD WITHOUT CONTRAST TECHNIQUE: Multiplanar, multiecho pulse sequences of the brain and surrounding structures were obtained without intravenous contrast. COMPARISON:  Head CT 0013 hours and CTA head and neck 0218 hours today. FINDINGS: Brain: No convincing diffusion restriction. No midline shift, mass effect, evidence of mass lesion, ventriculomegaly, extra-axial collection or acute intracranial hemorrhage. Cervicomedullary junction and pituitary are within normal limits. Scattered but generally mild for age cerebral white matter T2 and FLAIR hyperintensity in both hemispheres. No cortical encephalomalacia identified. Chronic microhemorrhage in the posterior left internal capsule. But no other chronic cerebral blood products identified. Deep gray nuclei, brainstem and cerebellum appear within normal limits. Vascular: Major intracranial vascular flow voids are preserved. Skull and upper cervical spine: Widespread upper cervical spine degeneration with at least mild multilevel spinal stenosis C3-C4 and C4-C5 on series 9, image 11. Visualized bone marrow signal is within normal limits. Sinuses/Orbits: Postoperative changes to both globes, otherwise negative. Other: Mastoids are clear. Grossly normal visible internal auditory structures Right parotid space nodule as seen on CTA earlier today is primarily T2 hyperintense, with evidence of a small fluid level (series 10, image 1). It measures up to 2 cm long axis, with no evidence of hypercellularity on DWI. 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 IMPRESSION: 1. No convincing acute infarct or acute intracranial abnormality. 2. Largely normal for age noncontrast MRI appearance of the brain; solitary chronic microhemorrhage in the left hemisphere and mild nonspecific white matter signal changes. 3. Cervical spine degeneration with  multilevel spinal stenosis. 4. Right parotid gland nodule seen on earlier CTA has benign MRI characteristics. Electronically Signed   By: Genevie Ann M.D.   On: 08/07/2021 05:25   DG Chest Portable 1 View  Result Date: 08/07/2021 CLINICAL DATA:  Shortness of breath. EXAM: PORTABLE CHEST 1 VIEW COMPARISON:  Chest radiograph dated 08/16/2006. FINDINGS: There is diffuse chronic interstitial coarsening and bronchitic changes. There is left lung base atelectasis. No consolidative changes. There is no pleural effusion pneumothorax. Mild cardiomegaly. Atherosclerotic calcification of the aorta. No acute osseous pathology. IMPRESSION: No acute cardiopulmonary process. Electronically Signed   By: Anner Crete M.D.   On: 08/07/2021 02:55   EEG adult  Result Date: 08/07/2021 Greta Doom, MD     08/07/2021  2:37 PM History: 88 year old female being evaluated for transient neurological symptoms Sedation: None Technique: This EEG was acquired with electrodes placed according to the International 10-20 electrode system (including Fp1, Fp2, F3, F4, C3, C4, P3, P4, O1, O2, T3, T4, T5, T6, A1, A2, Fz, Cz, Pz). The following electrodes were missing or displaced: none. Background: The background consists of intermixed alpha and beta activities. There is a well defined posterior dominant rhythm of nine hz that attenuates with eye opening. Sleep is  recorded with normal appearing structures. Photic stimulation: Physiologic driving is present EEG Abnormalities: None Clinical Interpretation: This normal EEG is recorded in the waking and drowsy state. There was no seizure or seizure predisposition recorded on this study. Please note that lack of epileptiform activity on EEG does not preclude the possibility of epilepsy. Roland Rack, MD [facility] [phone] If 7pm- 7am, please page neurology on call as listed in [place].   ECHOCARDIOGRAM COMPLETE  Result Date: 08/07/2021    ECHOCARDIOGRAM REPORT    Patient Name:   [facility] Date of Exam: 08/07/2021 Medical Rec #:  [id number]   Height:       60.0 in Accession #:    [id number]  Weight:       180.8 lb Date of Birth:  [birth year]   BSA:          1.788 m Patient Age:    15 years    BP:           126/74 mmHg Patient Gender: F           HR:           71 bpm. Exam Location:  Inpatient Procedure: 2D Echo, Cardiac Doppler and Color Doppler Indications:    Stroke  History:        Patient has no prior history of Echocardiogram examinations.                 Arrythmias:Atrial Fibrillation; Risk Factors:Hypertension.  Sonographer:    Jefferey Pica Referring Phys: [id number] [place]  1. Left ventricular ejection fraction, by estimation, is 60 to 65%. The left ventricle has normal function. The left ventricle has no regional wall motion abnormalities. There is mild left ventricular hypertrophy. Left ventricular diastolic parameters are indeterminate.  2. Right ventricular systolic function is normal. The right ventricular size is normal. There is normal pulmonary artery systolic pressure.  3. Left atrial size was mild to moderately dilated.  4. Right atrial size was moderately dilated.  5. Mild mitral valve regurgitation.  6. The aortic valve is tricuspid. Aortic valve regurgitation is mild. Aortic valve sclerosis is present, with no evidence of aortic valve stenosis.  7. The inferior vena cava is normal in size with greater than 50% respiratory variability, suggesting right atrial pressure of 3 mmHg. FINDINGS  Left Ventricle: Left ventricular ejection fraction, by estimation, is 60 to 65%. The left ventricle has normal function. The left ventricle has no regional wall motion abnormalities. The left ventricular internal cavity size was normal in size. There is  mild left ventricular hypertrophy. Left ventricular diastolic parameters are indeterminate. Right Ventricle: The right ventricular size is normal. Right vetricular wall thickness was not assessed.  Right ventricular systolic function is normal. There is normal pulmonary artery systolic pressure. The tricuspid regurgitant velocity is 2.61 m/s, and with an assumed right atrial pressure of 5 mmHg, the estimated right ventricular systolic pressure is 88.3 mmHg. Left Atrium: Left atrial size was mild to moderately dilated. Right Atrium: Right atrial size was moderately dilated. Pericardium: Trivial pericardial effusion is present. Mitral Valve: There is mild thickening of the mitral valve leaflet(s). Mild mitral valve regurgitation. Tricuspid Valve: The tricuspid valve is normal in structure. Tricuspid valve regurgitation is mild. Aortic Valve: The aortic valve is tricuspid. Aortic valve regurgitation is mild. Aortic regurgitation PHT measures 816 msec. Aortic valve sclerosis is present, with no evidence of aortic valve stenosis. Aortic valve peak gradient measures 4.2 mmHg. Pulmonic Valve: The pulmonic valve was  normal in structure. Pulmonic valve regurgitation is mild. Aorta: The aortic root and ascending aorta are structurally normal, with no evidence of dilitation. Venous: The inferior vena cava is normal in size with greater than 50% respiratory variability, suggesting right atrial pressure of 3 mmHg. IAS/Shunts: No atrial level shunt detected by color flow Doppler.  LEFT VENTRICLE PLAX 2D LVIDd:         4.70 cm LVIDs:         3.10 cm LV PW:         1.20 cm LV IVS:        1.10 cm LVOT diam:     1.80 cm LV SV:         51 LV SV Index:   28 LVOT Area:     2.54 cm  RIGHT VENTRICLE          IVC RV Basal diam:  3.50 cm  IVC diam: 1.70 cm RV Mid diam:    3.10 cm LEFT ATRIUM             Index        RIGHT ATRIUM           Index LA diam:        4.30 cm 2.40 cm/m   RA Area:     21.80 cm LA Vol (A2C):   53.8 ml 30.09 ml/m  RA Volume:   67.00 ml  37.47 ml/m LA Vol (A4C):   73.8 ml 41.27 ml/m LA Biplane Vol: 63.5 ml 35.51 ml/m  AORTIC VALVE                 PULMONIC VALVE AV Area (Vmax): 2.61 cm     PV Vmax:        0.46 m/s AV Vmax:        102.82 cm/s  PV Peak grad:  0.8 mmHg AV Peak Grad:   4.2 mmHg LVOT Vmax:      105.50 cm/s LVOT Vmean:     59.400 cm/s LVOT VTI:       0.200 m AI PHT:         816 msec  AORTA Ao Asc diam: 3.00 cm MITRAL VALVE               TRICUSPID VALVE MV Area (PHT): 5.62 cm    TR Peak grad:   27.2 mmHg MV Decel Time: 135 msec    TR Vmax:        261.00 cm/s MV E velocity: 88.10 cm/s                            SHUNTS                            Systemic VTI:  0.20 m                            Systemic Diam: 1.80 cm Dorris Carnes MD Electronically signed by Dorris Carnes MD Signature Date/Time: 08/07/2021/4:58:37 PM    Final    CT HEAD CODE STROKE WO CONTRAST  Result Date: 08/07/2021 CLINICAL DATA:  Code stroke.  Acute neurologic deficit EXAM: CT HEAD WITHOUT CONTRAST TECHNIQUE: Contiguous axial images were obtained from the base of the skull through the vertex without intravenous contrast. RADIATION DOSE REDUCTION: This exam was performed according to the departmental dose-optimization program which includes  automated exposure control, adjustment of the mA and/or kV according to patient size and/or use of iterative reconstruction technique. COMPARISON:  None Available. FINDINGS: Brain: There is no mass, hemorrhage or extra-axial collection. The size and configuration of the ventricles and extra-axial CSF spaces are normal. There is hypoattenuation of the periventricular white matter, most commonly indicating chronic ischemic microangiopathy. Vascular: No abnormal hyperdensity of the major intracranial arteries or dural venous sinuses. No intracranial atherosclerosis. Skull: The visualized skull base, calvarium and extracranial soft tissues are normal. Sinuses/Orbits: No fluid levels or advanced mucosal thickening of the visualized paranasal sinuses. No mastoid or middle ear effusion. The orbits are normal. ASPECTS [facility] Stroke Program Early CT Score) - Ganglionic level infarction (caudate, lentiform nuclei,  internal capsule, insula, M1-M3 cortex): 7 - Supraganglionic infarction (M4-M6 cortex): 3 Total score (0-10 with 10 being normal): 10 IMPRESSION: 1. No acute intracranial abnormality. 2. ASPECTS is 10. These results were called by telephone at the time of interpretation on 08/07/2021 at 12:22 am to provider [facility] , who verbally acknowledged these results. Electronically Signed   By: Ulyses Jarred M.D.   On: 08/07/2021 00:23    Microbiology: No results found for this or any previous visit.  Labs: CBC: Recent Labs  Lab 08/07/21 0006 08/07/21 0015 08/07/21 0330  WBC 8.7  --  7.5  NEUTROABS 3.5  --  4.6  HGB 14.9 15.0 14.2  HCT 44.3 44.0 40.8  MCV 96.5  --  95.8  PLT 185  --  893   Basic Metabolic Panel: Recent Labs  Lab 08/07/21 0006 08/07/21 0015 08/07/21 0131 08/07/21 0330  NA 141 141  --  140  K 3.4* 3.3*  --  3.7  CL 107 105  --  110  CO2 25  --   --  23  GLUCOSE 124* 124*  --  120*  BUN 18 19  --  17  CREATININE 1.15* 1.20*  --  1.10*  CALCIUM 9.5  --   --  9.0  MG  --   --  1.9 1.9   Liver Function Tests: Recent Labs  Lab 08/07/21 0006 08/07/21 0330  AST 32 28  ALT 28 27  ALKPHOS 84 80  BILITOT 0.6 0.6  PROT 7.3 6.5  ALBUMIN 3.6 3.2*   CBG: No results for input(s): GLUCAP in the last 168 hours.  Discharge time spent: approximately 40 minutes spent on discharge counseling, evaluation of patient on day of discharge, and coordination of discharge planning with nursing, social work, pharmacy and case management  Signed: Edwin Dada, MD [facility] 08/07/2021

## 2021-08-07 NOTE — ED Triage Notes (Addendum)
Pt BIB [facility] from home after daughter called out stating pt having slurred speech, L sided weakness, and L sided gaze. LKW 2300. Per EMS symptoms resolved completely at 2340. Pt A&Ox4 Hx- Afib, on eliquis

## 2021-08-07 NOTE — Assessment & Plan Note (Signed)
 #)   Hypokalemia: Presenting serum potassium level 3.4, notable in the setting of initial atrial fibrillation with RVR.  Status post 40 mill equivalents of oral potassium chloride in the ED.  Plan: Monitor on telemetry.  Repeat BMP in the morning.  Magnesium sulfate 2 g IV over 2 hours x 1 dose now, with repeat serum magnesium level in the morning.

## 2021-08-07 NOTE — Assessment & Plan Note (Signed)
 #)   Essential Hypertension: documented h/o such, with outpatient antihypertensive regimen including HCTZ, losartan, daily diltiazem, metoprolol succinate.  SBP's in the ED today: Normotensive.  will pursue permissive hypertension until 2300 on 08/08/2021 in the setting of suspected presenting TIA/stroke, as further detailed above.  Consequently, will hold home HCTZ and losartan over that timeframe, but continue home AV nodal blocking agents in the setting of paroxysmal atrial fibrillation with initial RVR.  Plan: Close monitoring of subsequent BP via routine VS. holding home losartan and HCTZ for now, as above.  Continue home AV nodal blocking agents.  Permissive hypertension, as above.

## 2021-08-07 NOTE — ED Notes (Signed)
Pt stated her "IV was stinging", this RN slowed the mag rate. Pt had no complaints at this time.

## 2021-08-07 NOTE — ED Notes (Signed)
Patient transported to MRI 

## 2021-08-07 NOTE — Consult Note (Signed)
Neurology Consultation  Reason for Consult: stroke like symptoms at home Referring Physician: Dr Pearline Cables  CC: left sided weakness, AMS, left gaze preference  History is obtained from: EMS, patient  HPI: Morgan Jacobs is a 88 y.o. female PMH of Afib on eliquis (taking only once a day on her own volition due to presumed side effects of dry skin), HTN, obesity, presents to the emergency department from home via [facility] for concerns of altered mental status and confusion along with left-sided weakness.  EMS reported that she had a leftward gaze and was preferring the left side during those symptoms. Last known well was 2300 on 08/06/2021 when she was watching TV and all of a sudden her speech became slurred.  She sounded confused.  EMS was called.  On their evaluation, she had left arm and leg weakness and could not raise it up.  Also tending more to the left side than the right, with presumed left gaze preference noted by EMS. Her symptoms resolved on the way to the hospital.  Patient herself reports feeling much better.  She remembers this instance.  No prior history of strokes or seizures. Has paroxysmal atrial fibrillation-is on Eliquis 5 mg twice daily but chooses to take only once a day for the past week because of some dry skin that she had been getting that she thought is because of Eliquis.  LKW: 2300 hrs. 08/06/2021 tpa given?: no, NIH 0 Premorbid modified Rankin scale (mRS): 1 ROS: Full ROS was performed and is negative except as noted in the HPI.   History reviewed. No pertinent past medical history. Prior medical history documented in HPI  History reviewed. No pertinent family history.  Social History:   has no history on file for tobacco use, alcohol use, and drug use.  Medications  Current Facility-Administered Medications:    sodium chloride flush (NS) 0.9 % injection 3 mL, 3 mL, Intravenous, Once, Wynona Dove A, DO No current outpatient medications on file. Home meds being  reconciled  Exam: Current vital signs: BP 131/90   Pulse 98   Temp 97.8 F (36.6 C) (Oral)   Resp 18   Ht 5' (1.524 m)   Wt 82 kg   SpO2 96%   BMI 35.31 kg/m  Vital signs in last 24 hours: Temp:  [97.8 F (36.6 C)] 97.8 F (36.6 C) (05/21 0020) Pulse Rate:  [98] 98 (05/21 0020) Resp:  [18] 18 (05/21 0020) BP: (131)/(90) 131/90 (05/21 0020) SpO2:  [96 %] 96 % (05/21 0020) Weight:  [82 kg] 82 kg (05/21 0020)  General: Awake alert in no distress HEENT: Normocephalic atraumatic Lungs: Clear Cardiovascular: Regular rhythm Abdomen nondistended nontender Neurological exam Awake alert oriented x3 No dysarthria No aphasia Mildly diminished attention concentration Cranial nerves II to XII intact Motor examination with no vertical drift in any of the 4 extremities Sensation intact light touch Coordination with no dysmetria Gait testing deferred, NIH stroke scale 0  Labs I have reviewed labs in epic and the results pertinent to this consultation are:   CBC    Component Value Date/Time   WBC 8.7 08/07/2021 0006   RBC 4.59 08/07/2021 0006   HGB 15.0 08/07/2021 0015   HCT 44.0 08/07/2021 0015   PLT 185 08/07/2021 0006   MCV 96.5 08/07/2021 0006   MCH 32.5 08/07/2021 0006   MCHC 33.6 08/07/2021 0006   RDW 12.3 08/07/2021 0006   LYMPHSABS 3.8 08/07/2021 0006   MONOABS 0.8 08/07/2021 0006   EOSABS 0.5  08/07/2021 0006   BASOSABS 0.1 08/07/2021 0006    CMP     Component Value Date/Time   NA 141 08/07/2021 0015   K 3.3 (L) 08/07/2021 0015   CL 105 08/07/2021 0015   GLUCOSE 124 (H) 08/07/2021 0015   BUN 19 08/07/2021 0015   CREATININE 1.20 (H) 08/07/2021 0015   Imaging I have reviewed the images obtained:  CT-head-aspects 10.  No acute changes.  Assessment: 88 year old with history of paroxysmal atrial fibrillation on Eliquis who is somewhat compliant to medication-only taking one of the 2 doses of Eliquis each day presented for evaluation of left-sided  weakness altered mental status and speech changes along with leftward gaze deviation-symptoms are resolved by the time she arrived to the ER. Stroke/TIA remains top of the differential but left-sided weakness and left-sided gaze also raises suspicion for a possible seizure.  No prior history of seizures. Will benefit from stroke/TIA work-up as well as an EEG.  Impression Stroke/TIA versus seizure Evaluate for underlying infection causing altered mental status   Recommendations: Admit to hospitalist Neurochecks I held off on doing a CTA due to prior history of a renal cyst and unknown renal function and no recent labs.  NIH stroke scale was 0 precluding her from an emergent intervention. If the renal function is okay, CT angiogram head and neck could be done.  If the renal function is impaired, MRA head without contrast and carotid Dopplers should be done. MRI brain without contrast 2D echo A1c Lipid panel With a reassuring examination, and no evidence of bleed or ischemic infarct of large size on CT-it is not likely to be a big stroke.  I would continue the Eliquis for now. High intensity statin PT OT Speech therapy I would continue at least every hour neurochecks for now for at least till end of shift. Also check UA, chest x-ray  Plan discussed with Dr. Pearline Cables. -- Amie Portland, MD Neurologist [facility] Pager: [phone]

## 2021-08-07 NOTE — Procedures (Signed)
History: 88 year old female being evaluated for transient neurological symptoms  Sedation: None  Technique: This EEG was acquired with electrodes placed according to the International 10-20 electrode system (including Fp1, Fp2, F3, F4, C3, C4, P3, P4, O1, O2, T3, T4, T5, T6, A1, A2, Fz, Cz, Pz). The following electrodes were missing or displaced: none.   Background: The background consists of intermixed alpha and beta activities. There is a well defined posterior dominant rhythm of nine hz that attenuates with eye opening. Sleep is recorded with normal appearing structures.   Photic stimulation: Physiologic driving is present  EEG Abnormalities: None  Clinical Interpretation: This normal EEG is recorded in the waking and drowsy state. There was no seizure or seizure predisposition recorded on this study. Please note that lack of epileptiform activity on EEG does not preclude the possibility of epilepsy.   Roland Rack, MD [facility] [phone]  If 7pm- 7am, please page neurology on call as listed in [place].

## 2021-08-07 NOTE — Hospital Course (Addendum)
Morgan Jacobs is an 88 y.o. F with AF on Eliquis, CKD IIIa baseline 1.1, HTN, hypothyroidism and obesity who presented with left sided weakness and slurred speech.  Symptoms resolved by the time of her arrival to the ER.

## 2021-08-07 NOTE — Assessment & Plan Note (Signed)
 #)   Paroxysmal atrial fibrillation: Documented history of such. In setting of CHA2DS2-VASc score of  4, there is an indication for chronic anticoagulation for thromboembolic prophylaxis. Consistent with this, patient is chronically anticoagulated on Eliquis, although with acknowledged suboptimal compliance, as further detailed above. Home AV nodal blocking regimen: Metoprolol succinate and daily diltiazem.  Presenting EKG showed atrial fibrillation with RVR with initial ventricular rates in the low 120s, which improved following dose of IV Lopressor, consistent with the patient's report of having missed her dose of metoprolol succinate yesterday.  Of note, presenting serum potassium low 3.4 status post 40 mill equivalents p.o. potassium chloride in the ED.  Also, serum magnesium level 1.9.  Will optimize with 2 g of IV magnesium sulfate, as below.   Plan: monitor strict I's & O's and daily weights. Repeat BMP/CBC in AM.  Magnesium sulfate 2 g IV every 2 hours x1 dose now, with repeat serum magnesium level in the morning.  Continue home AV nodal blocking regimen with next dose of metoprolol succinate to occur now, as the patient conveys that she missed her dose yesterday..  Continue home Eliquis, with counseling of the patient on the importance of improved compliance with this medication.  Monitor on telemetry.

## 2021-08-07 NOTE — ED Notes (Signed)
Per ED tech, CBG was 121

## 2021-08-07 NOTE — ED Provider Notes (Signed)
[facility] EMERGENCY DEPARTMENT Provider Note   CSN: [id number] Arrival date & time: 08/07/21  0006  An emergency department physician performed an initial assessment on this suspected stroke patient at [facility].  History  Chief Complaint  Patient presents with   Code Stroke    Morgan Jacobs is a 88 y.o. female.  The history is provided by the patient and a relative. No language interpreter was used.   Patient as above with significant medical history as below, including atrial fibrillation on Eliquis, hypertension, variable medication compliance who presents to the ED with complaint of strokelike symptoms.  Last known normal was around 11 PM yesterday evening.  Family ports patient was at home, watching TV, she began shouting.  Family came to assess since she was having some difficulty with her speech, slurred.  Difficulty word finding.  Family felt the patient was "acting like she was drunk."  EMS arrival patient with some left-sided weakness.  Patient symptoms resolved in route to the hospital of note patient is on Eliquis for atrial fibrillation, she only takes it once daily because "it makes my skin itchy."  Family also reports she has verbal compliance with her beta-blocker.  Patient reports at this time she feels back to normal.  She has been feeling in her normal state of health the past few days leading up to this presentation.     History reviewed. No pertinent past medical history.  History reviewed. No pertinent surgical history.     Home Medications Prior to Admission medications   Medication Sig Start Date End Date Taking? Authorizing Provider  [facility] ASPIRIN ADULT LOW DOSE 81 MG chewable tablet Chew 81 mg by mouth daily. 04/20/21  Yes [provider]  DILT-XR 240 MG 24 hr capsule Take 240 mg by mouth daily. 06/11/21  Yes [provider]  ELIQUIS 5 MG TABS tablet Take 5 mg by mouth 2 (two) times daily. 06/06/21  Yes [provider]   loratadine (CLARITIN) 10 MG tablet Take 10 mg by mouth 2 (two) times daily. 06/14/21  Yes [provider]  losartan-hydrochlorothiazide (HYZAAR) 50-12.5 MG tablet Take 1 tablet by mouth daily. 05/12/21  Yes [provider]  metoprolol succinate (TOPROL-XL) 50 MG 24 hr tablet Take 50 mg by mouth daily. 06/11/21  Yes [provider]  omeprazole (PRILOSEC) 20 MG capsule Take 20 mg by mouth daily as needed (reflux). 03/16/21  Yes [provider]      Allergies    Patient has no allergy information on record.    Review of Systems   Review of Systems  Constitutional:  Negative for chills and fever.  HENT:  Negative for facial swelling and trouble swallowing.   Eyes:  Negative for photophobia and visual disturbance.  Respiratory:  Negative for cough and shortness of breath.   Cardiovascular:  Negative for chest pain and palpitations.  Gastrointestinal:  Negative for abdominal pain, nausea and vomiting.  Endocrine: Negative for polydipsia and polyuria.  Genitourinary:  Negative for difficulty urinating and hematuria.  Musculoskeletal:  Negative for gait problem and joint swelling.  Skin:  Negative for pallor and rash.  Neurological:  Positive for speech difficulty and weakness. Negative for syncope and headaches.  Psychiatric/Behavioral:  Negative for agitation and confusion.    Physical Exam Updated Vital Signs BP (!) 139/96   Pulse 99   Temp 97.8 F (36.6 C) (Oral)   Resp 20   Ht 5' (1.524 m)   Wt 82 kg  SpO2 92%   BMI 35.31 kg/m  Physical Exam Vitals and nursing note reviewed.  Constitutional:      General: She is not in acute distress.    Appearance: Normal appearance. She is not ill-appearing.  HENT:     Head: Normocephalic and atraumatic.     Right Ear: External ear normal.     Left Ear: External ear normal.     Nose: Nose normal.     Mouth/Throat:     Mouth: Mucous membranes are moist.  Eyes:     General: No scleral icterus.        Right eye: No discharge.        Left eye: No discharge.     Extraocular Movements: Extraocular movements intact.     Pupils: Pupils are equal, round, and reactive to light.  Cardiovascular:     Rate and Rhythm: Tachycardia present. Rhythm irregular.     Pulses: Normal pulses.     Heart sounds: Normal heart sounds.  Pulmonary:     Effort: Pulmonary effort is normal. No respiratory distress.     Breath sounds: Normal breath sounds.  Abdominal:     General: Abdomen is flat.     Palpations: Abdomen is soft.     Tenderness: There is no abdominal tenderness. There is no guarding or rebound.  Musculoskeletal:        General: Normal range of motion.     Cervical back: Full passive range of motion without pain and normal range of motion.     Right lower leg: No edema.     Left lower leg: No edema.  Skin:    General: Skin is warm and dry.     Capillary Refill: Capillary refill takes less than 2 seconds.  Neurological:     Mental Status: She is alert and oriented to person, place, and time. Mental status is at baseline.     GCS: GCS eye subscore is 4. GCS verbal subscore is 5. GCS motor subscore is 6.     Cranial Nerves: Cranial nerves 2-12 are intact. No dysarthria.     Sensory: Sensation is intact.     Motor: Motor function is intact. No tremor.     Coordination: Coordination is intact.  Psychiatric:        Mood and Affect: Mood normal.        Behavior: Behavior normal.    ED Results / Procedures / Treatments   Labs (all labs ordered are listed, but only abnormal results are displayed) Labs Reviewed  COMPREHENSIVE METABOLIC PANEL - Abnormal; Notable for the following components:      Result Value   Potassium 3.4 (*)    Glucose, Bld 124 (*)    Creatinine, Ser 1.15 (*)    GFR, Estimated 46 (*)    All other components within normal limits  URINALYSIS, ROUTINE W REFLEX MICROSCOPIC - Abnormal; Notable for the following components:   Color, Urine STRAW (*)    All other components  within normal limits  HEMOGLOBIN A1C - Abnormal; Notable for the following components:   Hgb A1c MFr Bld 5.7 (*)    All other components within normal limits  COMPREHENSIVE METABOLIC PANEL - Abnormal; Notable for the following components:   Glucose, Bld 120 (*)    Creatinine, Ser 1.10 (*)    Albumin 3.2 (*)    GFR, Estimated 49 (*)    All other components within normal limits  I-STAT CHEM 8, ED - Abnormal; Notable for the following  components:   Potassium 3.3 (*)    Creatinine, Ser 1.20 (*)    Glucose, Bld 124 (*)    Calcium, Ion 1.09 (*)    All other components within normal limits  PROTIME-INR  APTT  CBC  DIFFERENTIAL  MAGNESIUM  CK  LIPID PANEL  MAGNESIUM  CBC WITH DIFFERENTIAL/PLATELET  CBG MONITORING, ED  TROPONIN I (HIGH SENSITIVITY)  TROPONIN I (HIGH SENSITIVITY)    EKG EKG Interpretation  Date/Time:  Sunday Aug 07 2021 00:27:41 EDT Ventricular Rate:  121 PR Interval:    QRS Duration: 83 QT Interval:  347 QTC Calculation: 493 R Axis:   76 Text Interpretation: Atrial fibrillation with rvr Abnormal R-wave progression, late transition Nonspecific repol abnormality, lateral leads Confirmed by Wynona Dove ([id number]) on 08/07/2021 1:36:42 AM  Radiology CT ANGIO HEAD NECK W WO CM  Result Date: 08/07/2021 CLINICAL DATA:  Transient ischemic attack EXAM: CT ANGIOGRAPHY HEAD AND NECK TECHNIQUE: Multidetector CT imaging of the head and neck was performed using the standard protocol during bolus administration of intravenous contrast. Multiplanar CT image reconstructions and MIPs were obtained to evaluate the vascular anatomy. Carotid stenosis measurements (when applicable) are obtained utilizing NASCET criteria, using the distal internal carotid diameter as the denominator. RADIATION DOSE REDUCTION: This exam was performed according to the departmental dose-optimization program which includes automated exposure control, adjustment of the mA and/or kV according to patient size  and/or use of iterative reconstruction technique. CONTRAST:  36m OMNIPAQUE IOHEXOL 300 MG/ML  SOLN COMPARISON:  None Available. FINDINGS: CTA NECK FINDINGS SKELETON: There is no bony spinal canal stenosis. No lytic or blastic lesion. OTHER NECK: There is a right parotid mass that measures 1.4 cm. UPPER CHEST: No pneumothorax or pleural effusion. No nodules or masses. AORTIC ARCH: There is calcific atherosclerosis of the aortic arch. There is no aneurysm, dissection or hemodynamically significant stenosis of the visualized portion of the aorta. Conventional 3 vessel aortic branching pattern. The visualized proximal subclavian arteries are widely patent. RIGHT CAROTID SYSTEM: Normal without aneurysm, dissection or stenosis. LEFT CAROTID SYSTEM: Normal without aneurysm, dissection or stenosis. VERTEBRAL ARTERIES: Left dominant configuration. Both origins are clearly patent. There is no dissection, occlusion or flow-limiting stenosis to the skull base (V1-V3 segments). CTA HEAD FINDINGS POSTERIOR CIRCULATION: --Vertebral arteries: Normal V4 segments. --Inferior cerebellar arteries: Normal. --Basilar artery: Normal. --Superior cerebellar arteries: Normal. --Posterior cerebral arteries (PCA): Normal. ANTERIOR CIRCULATION: --Intracranial internal carotid arteries: Normal. --Anterior cerebral arteries (ACA): Normal. Both A1 segments are present. Patent anterior communicating artery (a-comm). --Middle cerebral arteries (MCA): Normal. VENOUS SINUSES: As permitted by contrast timing, patent. ANATOMIC VARIANTS: None Review of the MIP images confirms the above findings. IMPRESSION: 1. No emergent large vessel occlusion or high-grade stenosis of the intracranial or cervical arteries. 2. 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 Histologic sampling should be considered, as the imaging features of benign and malignant parotid neoplasms overlap considerably. 3. Aortic Atherosclerosis (ICD10-I70.0). Electronically Signed   By: KUlyses JarredM.D.   On: 08/07/2021 02:36   MR BRAIN WO CONTRAST  Result Date: 08/07/2021 CLINICAL DATA:  88year old female with altered mental status, left gaze preference. Code stroke presentation. EXAM: MRI HEAD WITHOUT CONTRAST TECHNIQUE: Multiplanar, multiecho pulse sequences of the brain and surrounding structures were obtained without intravenous contrast. COMPARISON:  Head CT 0013 hours and CTA head and neck 0218 hours today. FINDINGS: Brain: No convincing diffusion restriction. No midline shift, mass effect, evidence of mass lesion, ventriculomegaly, extra-axial collection or acute intracranial hemorrhage. Cervicomedullary junction  and pituitary are within normal limits. Scattered but generally mild for age cerebral white matter T2 and FLAIR hyperintensity in both hemispheres. No cortical encephalomalacia identified. Chronic microhemorrhage in the posterior left internal capsule. But no other chronic cerebral blood products identified. Deep Jaiana Sheffer nuclei, brainstem and cerebellum appear within normal limits. Vascular: Major intracranial vascular flow voids are preserved. Skull and upper cervical spine: Widespread upper cervical spine degeneration with at least mild multilevel spinal stenosis C3-C4 and C4-C5 on series 9, image 11. Visualized bone marrow signal is within normal limits. Sinuses/Orbits: Postoperative changes to both globes, otherwise negative. Other: Mastoids are clear. Grossly normal visible internal auditory structures Right parotid space nodule as seen on CTA earlier today is primarily T2 hyperintense, with evidence of a small fluid level (series 10, image 1). It measures up to 2 cm long axis, with no evidence of hypercellularity on DWI. 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 IMPRESSION: 1. No convincing acute infarct or acute intracranial abnormality. 2. Largely normal for age noncontrast MRI appearance of the brain; solitary chronic microhemorrhage in the left  hemisphere and mild nonspecific white matter signal changes. 3. 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 4. Right parotid gland nodule seen on earlier CTA has benign MRI characteristics. Electronically Signed   By: Genevie Ann M.D.   On: 08/07/2021 05:25   DG Chest Portable 1 View  Result Date: 08/07/2021 CLINICAL DATA:  Shortness of breath. EXAM: PORTABLE CHEST 1 VIEW COMPARISON:  Chest radiograph dated 08/16/2006. FINDINGS: There is diffuse chronic interstitial coarsening and bronchitic changes. There is left lung base atelectasis. No consolidative changes. There is no pleural effusion pneumothorax. Mild cardiomegaly. Atherosclerotic calcification of the aorta. No acute osseous pathology. IMPRESSION: No acute cardiopulmonary process. Electronically Signed   By: Anner Crete M.D.   On: 08/07/2021 02:55   CT HEAD CODE STROKE WO CONTRAST  Result Date: 08/07/2021 CLINICAL DATA:  Code stroke.  Acute neurologic deficit EXAM: CT HEAD WITHOUT CONTRAST TECHNIQUE: Contiguous axial images were obtained from the base of the skull through the vertex without intravenous contrast. RADIATION DOSE REDUCTION: This exam was performed according to the departmental dose-optimization program which includes automated exposure control, adjustment of the mA and/or kV according to patient size and/or use of iterative reconstruction technique. COMPARISON:  None Available. FINDINGS: Brain: There is no mass, hemorrhage or extra-axial collection. The size and configuration of the ventricles and extra-axial CSF spaces are normal. There is hypoattenuation of the periventricular white matter, most commonly indicating chronic ischemic microangiopathy. Vascular: No abnormal hyperdensity of the major intracranial arteries or dural venous sinuses. No intracranial atherosclerosis. Skull: The visualized skull base, calvarium and extracranial soft tissues are normal. Sinuses/Orbits: No fluid levels or advanced mucosal  thickening of the visualized paranasal sinuses. No mastoid or middle ear effusion. The orbits are normal. ASPECTS [facility] Stroke Program Early CT Score) - Ganglionic level infarction (caudate, lentiform nuclei, internal capsule, insula, M1-M3 cortex): 7 - Supraganglionic infarction (M4-M6 cortex): 3 Total score (0-10 with 10 being normal): 10 IMPRESSION: 1. No acute intracranial abnormality. 2. ASPECTS is 10. These results were called by telephone at the time of interpretation on 08/07/2021 at 12:22 am to provider [facility] , who verbally acknowledged these results. Electronically Signed   By: Ulyses Jarred M.D.   On: 08/07/2021 00:23    Procedures .Critical Care Performed by: Jeanell Sparrow, DO Authorized by: Jeanell Sparrow, DO   Critical care provider statement:    Critical  care time (minutes):  30   Critical care time was exclusive of:  Separately billable procedures and treating other patients   Critical care was necessary to treat or prevent imminent or life-threatening deterioration of the following conditions:  CNS failure or compromise   Critical care was time spent personally by me on the following activities:  Development of treatment plan with patient or surrogate, discussions with consultants, evaluation of patient's response to treatment, examination of patient, ordering and review of laboratory studies, ordering and review of radiographic studies, ordering and performing treatments and interventions, pulse oximetry, re-evaluation of patient's condition, review of old charts and obtaining history from patient or surrogate   Care discussed with: admitting provider      Medications Ordered in ED Medications  acetaminophen (TYLENOL) tablet 650 mg (has no administration in time range)    Or  acetaminophen (TYLENOL) suppository 650 mg (has no administration in time range)  diltiazem (CARDIZEM CD) 24 hr capsule 240 mg (has no administration in time range)  apixaban (ELIQUIS) tablet 5  mg (has no administration in time range)  metoprolol succinate (TOPROL-XL) 24 hr tablet 50 mg (has no administration in time range)  sodium chloride flush (NS) 0.9 % injection 3 mL (3 mLs Intravenous Given 08/07/21 0029)  sodium chloride 0.9 % bolus 500 mL (0 mLs Intravenous Stopped 08/07/21 0342)  metoprolol tartrate (LOPRESSOR) injection 5 mg (5 mg Intravenous Given 08/07/21 0236)  potassium chloride SA (KLOR-CON M) CR tablet 40 mEq (40 mEq Oral Given 08/07/21 0235)  iohexol (OMNIPAQUE) 300 MG/ML solution 60 mL (60 mLs Intravenous Contrast Given 08/07/21 0215)   stroke: early stages of recovery book (1 each Does not apply Given 08/07/21 0342)  magnesium sulfate IVPB 2 g 50 mL (0 g Intravenous Stopped 08/07/21 0557)    ED Course/ Medical Decision Making/ A&P                           Medical Decision Making Amount and/or Complexity of Data Reviewed Labs: ordered. Radiology: ordered.  Risk Prescription drug management. Decision regarding hospitalization.    CC: Strokelike symptoms  This patient presents to the Emergency Department for the above complaint. This involves an extensive number of treatment options and is a complaint that carries with it a high risk of complications and morbidity. Vital signs were reviewed. Serious etiologies considered.  Differential diagnoses for altered mental status includes but is not exclusive to alcohol, illicit or prescription medications, intracranial pathology such as stroke, intracerebral hemorrhage, fever or infectious causes including sepsis, hypoxemia, uremia, trauma, endocrine related disorders such as diabetes, hypoglycemia, thyroid-related diseases, etc.   Airway cleared on arrival, patient sent to CT with neurology   Initial NIH stroke scale score is 0, not TNK candidate given resolution of symptoms. She is on DOAC but has variable compliance and has only been taking a half dose.   Record review:  Previous records obtained and reviewed care  typically received at [facility].  Reviewed prior labs, imaging, office notes.  Additional history obtained from children at bedside  Medical and surgical history as noted above.   Work up as above, notable for:   Labs & imaging results that were available during my care of the patient were visualized by me and considered in my medical decision making.   I ordered imaging studies which included CT head stroke protocol, and I visualized the imaging and I agree with radiologist interpretation.  No acute stroke.  CTA  head and neck ordered. CTA head/neck with incidental finding of parotid mass which may need further workup, defer to inpatient team-  otherwise no LVO.  Cardiac monitoring reviewed and interpreted personally which shows atrial fibrillation with RVR  Personally discussed patient care with consultant; Dr Jerelyn Charles neurology.  Given complete resolution of symptoms and variable compliance with Eliquis favor TIA as etiology of her symptoms today.  Recommend admission for TIA work-up.  Patient and family are agreeable to this plan  Management: Give patient small bolus of IV fluids, beta-blocker.  Reassessment:  Patient remains asymptomatic, NIH stroke scale of 0 at this time.    Plan to admit to hospitalist service for TIA w/u, spoke with Dr Eugenia Pancoast who accepts pt for admission.              Social determinants of health include -  Social History   Socioeconomic History   Marital status: Widowed    Spouse name: Not on file   Number of children: Not on file   Years of education: Not on file   Highest education level: Not on file  Occupational History   Not on file  Tobacco Use   Smoking status: Former    Types: Cigarettes   Smokeless tobacco: Never  Substance and Sexual Activity   Alcohol use: Not Currently   Drug use: Never   Sexual activity: Not on file  Other Topics Concern   Not on file  Social History Narrative   Not on file   Social Determinants of Health    Financial Resource Strain: Not on file  Food Insecurity: Not on file  Transportation Needs: Not on file  Physical Activity: Not on file  Stress: Not on file  Social Connections: Not on file  Intimate Partner Violence: Not on file      This chart was dictated using voice recognition software.  Despite best efforts to proofread,  errors can occur which can change the documentation meaning.         Final Clinical Impression(s) / ED Diagnoses Final diagnoses:  Stroke-like symptoms  Hypokalemia  Noncompliance with medications  Parotid mass    Rx / DC Orders ED Discharge Orders     None         Jeanell Sparrow, DO 08/07/21 [id number]

## 2021-08-16 DIAGNOSIS — K118 Other diseases of salivary glands: Secondary | ICD-10-CM | POA: Diagnosis not present

## 2021-08-16 DIAGNOSIS — Z79899 Other long term (current) drug therapy: Secondary | ICD-10-CM | POA: Diagnosis not present

## 2021-08-16 DIAGNOSIS — I10 Essential (primary) hypertension: Secondary | ICD-10-CM | POA: Diagnosis not present

## 2021-08-16 DIAGNOSIS — Z8673 Personal history of transient ischemic attack (TIA), and cerebral infarction without residual deficits: Secondary | ICD-10-CM | POA: Diagnosis not present

## 2021-08-16 DIAGNOSIS — Z139 Encounter for screening, unspecified: Secondary | ICD-10-CM | POA: Diagnosis not present

## 2021-08-16 DIAGNOSIS — E7800 Pure hypercholesterolemia, unspecified: Secondary | ICD-10-CM | POA: Diagnosis not present

## 2021-08-16 DIAGNOSIS — I480 Paroxysmal atrial fibrillation: Secondary | ICD-10-CM | POA: Diagnosis not present

## 2021-09-07 DIAGNOSIS — E785 Hyperlipidemia, unspecified: Secondary | ICD-10-CM | POA: Diagnosis not present

## 2021-09-07 DIAGNOSIS — I4891 Unspecified atrial fibrillation: Secondary | ICD-10-CM | POA: Diagnosis not present

## 2021-09-07 DIAGNOSIS — I2510 Atherosclerotic heart disease of native coronary artery without angina pectoris: Secondary | ICD-10-CM | POA: Diagnosis not present

## 2021-09-07 DIAGNOSIS — G459 Transient cerebral ischemic attack, unspecified: Secondary | ICD-10-CM | POA: Diagnosis not present

## 2021-09-28 ENCOUNTER — Other Ambulatory Visit: Payer: Self-pay | Admitting: *Deleted

## 2021-09-28 ENCOUNTER — Encounter: Payer: Self-pay | Admitting: *Deleted

## 2021-10-04 ENCOUNTER — Encounter: Payer: Self-pay | Admitting: Diagnostic Neuroimaging

## 2021-10-04 ENCOUNTER — Ambulatory Visit (INDEPENDENT_AMBULATORY_CARE_PROVIDER_SITE_OTHER): Payer: Medicare Other | Admitting: Diagnostic Neuroimaging

## 2021-10-04 VITALS — BP 115/78 | HR 85 | Ht 60.0 in | Wt 178.0 lb

## 2021-10-04 DIAGNOSIS — G459 Transient cerebral ischemic attack, unspecified: Secondary | ICD-10-CM | POA: Diagnosis not present

## 2021-10-04 NOTE — Progress Notes (Signed)
[facility]  PATIENT: Morgan Jacobs DOB: 1934/01/06  REFERRING CLINICIAN: Cyndi Bender, PA-C HISTORY FROM: patient and daughter REASON FOR VISIT: new consult    HISTORICAL  CHIEF COMPLAINT:  Chief Complaint  Patient presents with   TIA    Rm 7 New Pt, dgtr- Sherry, hx TIA in May, "doing okay since stroke"     HISTORY OF PRESENT ILLNESS:   88 year old female here for evaluation of TIA.  History of atrial fibrillation on Eliquis, chronic kidney disease, hypertension, hyperlipidemia.  Patient presented to hospital in May 2023 with left-sided weakness and slurred speech.  Symptoms resolved by the time she came to the hospital.  Stroke work-up was completed.  MRI was negative for acute findings.  Patient was treated medically.   REVIEW OF SYSTEMS: Full 14 system review of systems performed and negative with exception of: as per HPI.  ALLERGIES: No Known Allergies  HOME MEDICATIONS: Outpatient Medications Prior to Visit  Medication Sig Dispense Refill   aspirin 81 MG chewable tablet Chew 81 mg by mouth daily.     DILT-XR 240 MG 24 hr capsule Take 240 mg by mouth daily.     ELIQUIS 5 MG TABS tablet Take 5 mg by mouth 2 (two) times daily.     ezetimibe (ZETIA) 10 MG tablet Take 1 tablet (10 mg total) by mouth daily. 30 tablet 3   loratadine (CLARITIN) 10 MG tablet Take 10 mg by mouth 2 (two) times daily.     losartan-hydrochlorothiazide (HYZAAR) 50-12.5 MG tablet Take 1 tablet by mouth daily.     metoprolol succinate (TOPROL-XL) 50 MG 24 hr tablet Take 50 mg by mouth daily.     omeprazole (PRILOSEC) 20 MG capsule Take 20 mg by mouth daily as needed (reflux).     [employer] ASPIRIN ADULT LOW DOSE 81 MG chewable tablet Chew 81 mg by mouth daily.     No facility-administered medications prior to visit.    PAST MEDICAL HISTORY: Past Medical History:  Diagnosis Date   Asthma    Benign essential tremor    CAD (coronary artery disease)    COPD (chronic obstructive  pulmonary disease) (HCC)    Diverticulosis    Fatty liver    GERD (gastroesophageal reflux disease)    Gout    HTN (hypertension)    Hypercholesterolemia    Insomnia    Kidney stone    Osteoporosis    Parotid mass    Paroxysmal atrial fibrillation (HCC)    On Eliquis   TIA (transient ischemic attack)     PAST SURGICAL HISTORY: Past Surgical History:  Procedure Laterality Date   CATARACT EXTRACTION     heart stent     TONSILLECTOMY AND ADENOIDECTOMY      FAMILY HISTORY: Family History  Problem Relation Age of Onset   Hypertension Mother    Heart failure Mother    Tremor Mother    Hypertension Father    Hypertension Sister    Cancer Sister        brain, bone, pancreatic   Tremor Sister    Hypertension Brother     SOCIAL HISTORY: Social History   Socioeconomic History   Marital status: Widowed    Spouse name: Not on file   Number of children: 2   Years of education: 8   Highest education level: Not on file  Occupational History   Not on file  Tobacco Use   Smoking status: Former    Types: Cigarettes  Quit date: 03/20/1956    Years since quitting: 65.5   Smokeless tobacco: Current    Types: Snuff  Substance and Sexual Activity   Alcohol use: Not Currently   Drug use: Never   Sexual activity: Not on file  Other Topics Concern   Not on file  Social History Narrative   09/28/21 lives w/daughter most of time   Social Determinants of Health   Financial Resource Strain: Not on file  Food Insecurity: Not on file  Transportation Needs: Not on file  Physical Activity: Not on file  Stress: Not on file  Social Connections: Not on file  Intimate Partner Violence: Not on file     PHYSICAL EXAM  GENERAL EXAM/CONSTITUTIONAL: Vitals:  Vitals:   10/04/21 1118  BP: 115/78  Pulse: 85  Weight: 178 lb (80.7 kg)  Height: 5' (1.524 m)   Body mass index is 34.76 kg/m. Wt Readings from Last 3 Encounters:  10/04/21 178 lb (80.7 kg)  08/07/21 180 lb 12.4 oz  (82 kg)   Patient is in no distress; well developed, nourished and groomed; neck is supple  CARDIOVASCULAR: Examination of carotid arteries is normal; no carotid bruits Regular rate and rhythm, no murmurs Examination of peripheral vascular system by observation and palpation is normal  EYES: Ophthalmoscopic exam of optic discs and posterior segments is normal; no papilledema or hemorrhages No results found.  MUSCULOSKELETAL: Gait, strength, tone, movements noted in Neurologic exam below  NEUROLOGIC: MENTAL STATUS:      No data to display         awake, alert, oriented to person, place and time recent and remote memory intact normal attention and concentration language fluent, comprehension intact, naming intact fund of knowledge appropriate  CRANIAL NERVE:  2nd - no papilledema on fundoscopic exam 2nd, 3rd, 4th, 6th - pupils equal and reactive to light, visual fields full to confrontation, extraocular muscles intact, no nystagmus 5th - facial sensation symmetric 7th - facial strength symmetric 8th - hearing intact 9th - palate elevates symmetrically, uvula midline 11th - shoulder shrug symmetric 12th - tongue protrusion midline  MOTOR:  normal bulk and tone, full strength in the BUE, BLE MILD HEAD AND ARM POSTURAL TREMOR  SENSORY:  normal and symmetric to light touch, temperature, vibration  COORDINATION:  finger-nose-finger, fine finger movements normal  REFLEXES:  deep tendon reflexes TRACE and symmetric  GAIT/STATION:  narrow based gait     DIAGNOSTIC DATA (LABS, IMAGING, TESTING) - I reviewed patient records, labs, notes, testing and imaging myself where available.  Lab Results  Component Value Date   WBC 7.5 08/07/2021   HGB 14.2 08/07/2021   HCT 40.8 08/07/2021   MCV 95.8 08/07/2021   PLT 180 08/07/2021      Component Value Date/Time   NA 140 08/07/2021 0330   K 3.7 08/07/2021 0330   CL 110 08/07/2021 0330   CO2 23 08/07/2021 0330    GLUCOSE 120 (H) 08/07/2021 0330   BUN 17 08/07/2021 0330   CREATININE 1.10 (H) 08/07/2021 0330   CALCIUM 9.0 08/07/2021 0330   PROT 6.5 08/07/2021 0330   ALBUMIN 3.2 (L) 08/07/2021 0330   AST 28 08/07/2021 0330   ALT 27 08/07/2021 0330   ALKPHOS 80 08/07/2021 0330   BILITOT 0.6 08/07/2021 0330   GFRNONAA 49 (L) 08/07/2021 0330   Lab Results  Component Value Date   CHOL 153 08/07/2021   HDL 43 08/07/2021   LDLCALC 90 08/07/2021   TRIG 98 08/07/2021  CHOLHDL 3.6 08/07/2021   Lab Results  Component Value Date   HGBA1C 5.7 (H) 08/07/2021   No results found for: "VITAMINB12" No results found for: "TSH"   08/07/21 MRI brain [I reviewed images myself and agree with interpretation. -VRP]  1. No convincing acute infarct or acute intracranial abnormality. 2. Largely normal for age noncontrast MRI appearance of the brain; solitary chronic microhemorrhage in the left hemisphere and mild nonspecific white matter signal changes. 3. 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 4. Right parotid gland nodule seen on earlier CTA has benign MRI characteristics.  08/07/21  1. Left ventricular ejection fraction, by estimation, is 60 to 65%. The  left ventricle has normal function. The left ventricle has no regional  wall motion abnormalities. There is mild left ventricular hypertrophy.  Left ventricular diastolic parameters  are indeterminate.   2. Right ventricular systolic function is normal. The right ventricular  size is normal. There is normal pulmonary artery systolic pressure.   3. Left atrial size was mild to moderately dilated.   4. Right atrial size was moderately dilated.   5. Mild mitral valve regurgitation.   6. The aortic valve is tricuspid. Aortic valve regurgitation is mild.  Aortic valve sclerosis is present, with no evidence of aortic valve  stenosis.   7. The inferior vena cava is normal in size with greater than 50%  respiratory variability,  suggesting right atrial pressure of 3 mmHg.   08/07/21 CTA head / neck 1. No emergent large vessel occlusion or high-grade stenosis of the intracranial or cervical arteries. 2. 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 Histologic sampling should be considered, as the imaging features of benign and malignant parotid neoplasms overlap considerably. 3. Aortic Atherosclerosis (ICD10-I70.0).   ASSESSMENT AND PLAN  88 y.o. year old female here with:   Dx:  1. TIA (transient ischemic attack)      PLAN:  TIA (balance, gait diff x 1 hour) - continue eliquis '5mg'$  twice a day (atrial fibrillation) - no neurologic indication to add aspirin to eliquis; may need to discuss with cardiology if there is strong cardiac indication to add anti-platelet (aspirin) to her anti-coagulant (eliquis) - continue zetia, BP control  Return for pending if symptoms worsen or fail to improve, return to PCP.    Penni Bombard, MD 09/18/6376, 58:85 AM Certified in Neurology, Neurophysiology and Neuroimaging  [facility] [address], [place], [place] [address] [phone]

## 2021-10-04 NOTE — Patient Instructions (Signed)
  TIA (balance, gait diff x 1 hour)  - continue eliquis '5mg'$  twice a day (atrial fibrillation)  - no neurologic indication to add aspirin to eliquis; may need to discuss with cardiology if there is strong cardiac indication to add anti-platelet (aspirin) to her anti-coagulant (eliquis)  - continue zetia, BP control

## 2021-11-07 DIAGNOSIS — Z1331 Encounter for screening for depression: Secondary | ICD-10-CM | POA: Diagnosis not present

## 2021-11-07 DIAGNOSIS — Z6835 Body mass index (BMI) 35.0-35.9, adult: Secondary | ICD-10-CM | POA: Diagnosis not present

## 2021-11-07 DIAGNOSIS — E669 Obesity, unspecified: Secondary | ICD-10-CM | POA: Diagnosis not present

## 2021-11-07 DIAGNOSIS — Z9181 History of falling: Secondary | ICD-10-CM | POA: Diagnosis not present

## 2021-11-07 DIAGNOSIS — Z0000 Encounter for general adult medical examination without abnormal findings: Secondary | ICD-10-CM | POA: Diagnosis not present

## 2021-11-07 DIAGNOSIS — E785 Hyperlipidemia, unspecified: Secondary | ICD-10-CM | POA: Diagnosis not present

## 2021-11-09 DIAGNOSIS — R1032 Left lower quadrant pain: Secondary | ICD-10-CM | POA: Diagnosis not present

## 2021-11-09 DIAGNOSIS — Z8673 Personal history of transient ischemic attack (TIA), and cerebral infarction without residual deficits: Secondary | ICD-10-CM | POA: Diagnosis not present

## 2021-11-09 DIAGNOSIS — M25511 Pain in right shoulder: Secondary | ICD-10-CM | POA: Diagnosis not present

## 2021-11-09 DIAGNOSIS — K118 Other diseases of salivary glands: Secondary | ICD-10-CM | POA: Diagnosis not present

## 2021-11-18 DIAGNOSIS — J449 Chronic obstructive pulmonary disease, unspecified: Secondary | ICD-10-CM | POA: Diagnosis not present

## 2021-11-18 DIAGNOSIS — L508 Other urticaria: Secondary | ICD-10-CM | POA: Diagnosis not present

## 2021-11-18 DIAGNOSIS — E7800 Pure hypercholesterolemia, unspecified: Secondary | ICD-10-CM | POA: Diagnosis not present

## 2021-11-18 DIAGNOSIS — J309 Allergic rhinitis, unspecified: Secondary | ICD-10-CM | POA: Diagnosis not present

## 2021-11-18 DIAGNOSIS — K219 Gastro-esophageal reflux disease without esophagitis: Secondary | ICD-10-CM | POA: Diagnosis not present

## 2021-11-18 DIAGNOSIS — I10 Essential (primary) hypertension: Secondary | ICD-10-CM | POA: Diagnosis not present

## 2021-11-18 DIAGNOSIS — I2510 Atherosclerotic heart disease of native coronary artery without angina pectoris: Secondary | ICD-10-CM | POA: Diagnosis not present

## 2021-11-18 DIAGNOSIS — M545 Low back pain, unspecified: Secondary | ICD-10-CM | POA: Diagnosis not present

## 2021-11-18 DIAGNOSIS — I480 Paroxysmal atrial fibrillation: Secondary | ICD-10-CM | POA: Diagnosis not present

## 2021-11-23 DIAGNOSIS — E7800 Pure hypercholesterolemia, unspecified: Secondary | ICD-10-CM | POA: Diagnosis not present

## 2021-11-23 DIAGNOSIS — Z79899 Other long term (current) drug therapy: Secondary | ICD-10-CM | POA: Diagnosis not present

## 2021-12-07 DIAGNOSIS — M545 Low back pain, unspecified: Secondary | ICD-10-CM | POA: Diagnosis not present

## 2021-12-07 DIAGNOSIS — I2510 Atherosclerotic heart disease of native coronary artery without angina pectoris: Secondary | ICD-10-CM | POA: Diagnosis not present

## 2021-12-07 DIAGNOSIS — E7800 Pure hypercholesterolemia, unspecified: Secondary | ICD-10-CM | POA: Diagnosis not present

## 2021-12-07 DIAGNOSIS — R079 Chest pain, unspecified: Secondary | ICD-10-CM | POA: Diagnosis not present

## 2021-12-07 DIAGNOSIS — J309 Allergic rhinitis, unspecified: Secondary | ICD-10-CM | POA: Diagnosis not present

## 2021-12-07 DIAGNOSIS — I10 Essential (primary) hypertension: Secondary | ICD-10-CM | POA: Diagnosis not present

## 2021-12-07 DIAGNOSIS — I480 Paroxysmal atrial fibrillation: Secondary | ICD-10-CM | POA: Diagnosis not present

## 2021-12-07 DIAGNOSIS — K219 Gastro-esophageal reflux disease without esophagitis: Secondary | ICD-10-CM | POA: Diagnosis not present

## 2021-12-07 DIAGNOSIS — J449 Chronic obstructive pulmonary disease, unspecified: Secondary | ICD-10-CM | POA: Diagnosis not present

## 2021-12-30 DIAGNOSIS — J342 Deviated nasal septum: Secondary | ICD-10-CM | POA: Diagnosis not present

## 2021-12-30 DIAGNOSIS — R221 Localized swelling, mass and lump, neck: Secondary | ICD-10-CM | POA: Diagnosis not present

## 2021-12-30 DIAGNOSIS — K118 Other diseases of salivary glands: Secondary | ICD-10-CM | POA: Diagnosis not present

## 2021-12-30 DIAGNOSIS — D492 Neoplasm of unspecified behavior of bone, soft tissue, and skin: Secondary | ICD-10-CM | POA: Diagnosis not present

## 2021-12-30 DIAGNOSIS — Z87891 Personal history of nicotine dependence: Secondary | ICD-10-CM | POA: Diagnosis not present

## 2021-12-30 DIAGNOSIS — J343 Hypertrophy of nasal turbinates: Secondary | ICD-10-CM | POA: Diagnosis not present

## 2021-12-30 DIAGNOSIS — Z85828 Personal history of other malignant neoplasm of skin: Secondary | ICD-10-CM | POA: Diagnosis not present

## 2021-12-30 DIAGNOSIS — J449 Chronic obstructive pulmonary disease, unspecified: Secondary | ICD-10-CM | POA: Diagnosis not present

## 2022-01-06 DIAGNOSIS — J441 Chronic obstructive pulmonary disease with (acute) exacerbation: Secondary | ICD-10-CM | POA: Diagnosis not present

## 2022-01-06 DIAGNOSIS — J111 Influenza due to unidentified influenza virus with other respiratory manifestations: Secondary | ICD-10-CM | POA: Diagnosis not present

## 2022-01-06 DIAGNOSIS — R6889 Other general symptoms and signs: Secondary | ICD-10-CM | POA: Diagnosis not present

## 2022-01-06 DIAGNOSIS — Z20822 Contact with and (suspected) exposure to covid-19: Secondary | ICD-10-CM | POA: Diagnosis not present

## 2022-02-06 DIAGNOSIS — E041 Nontoxic single thyroid nodule: Secondary | ICD-10-CM | POA: Diagnosis not present

## 2022-02-06 DIAGNOSIS — K118 Other diseases of salivary glands: Secondary | ICD-10-CM | POA: Diagnosis not present

## 2022-03-02 DIAGNOSIS — I4891 Unspecified atrial fibrillation: Secondary | ICD-10-CM | POA: Diagnosis not present

## 2022-03-02 DIAGNOSIS — R0609 Other forms of dyspnea: Secondary | ICD-10-CM | POA: Diagnosis not present

## 2022-03-02 DIAGNOSIS — I517 Cardiomegaly: Secondary | ICD-10-CM | POA: Diagnosis not present

## 2022-03-02 DIAGNOSIS — E785 Hyperlipidemia, unspecified: Secondary | ICD-10-CM | POA: Diagnosis not present

## 2022-03-02 DIAGNOSIS — R0989 Other specified symptoms and signs involving the circulatory and respiratory systems: Secondary | ICD-10-CM | POA: Diagnosis not present

## 2022-03-02 DIAGNOSIS — I2510 Atherosclerotic heart disease of native coronary artery without angina pectoris: Secondary | ICD-10-CM | POA: Diagnosis not present

## 2022-03-02 DIAGNOSIS — I10 Essential (primary) hypertension: Secondary | ICD-10-CM | POA: Diagnosis not present

## 2022-03-02 DIAGNOSIS — I771 Stricture of artery: Secondary | ICD-10-CM | POA: Diagnosis not present

## 2022-03-02 DIAGNOSIS — I700 Atherosclerosis of aorta: Secondary | ICD-10-CM | POA: Diagnosis not present

## 2022-04-07 DIAGNOSIS — K118 Other diseases of salivary glands: Secondary | ICD-10-CM | POA: Diagnosis not present

## 2022-04-07 DIAGNOSIS — L989 Disorder of the skin and subcutaneous tissue, unspecified: Secondary | ICD-10-CM | POA: Diagnosis not present

## 2022-04-07 DIAGNOSIS — J309 Allergic rhinitis, unspecified: Secondary | ICD-10-CM | POA: Diagnosis not present

## 2022-04-14 DIAGNOSIS — M1712 Unilateral primary osteoarthritis, left knee: Secondary | ICD-10-CM | POA: Diagnosis not present

## 2022-04-14 DIAGNOSIS — E7800 Pure hypercholesterolemia, unspecified: Secondary | ICD-10-CM | POA: Diagnosis not present

## 2022-05-10 DIAGNOSIS — R300 Dysuria: Secondary | ICD-10-CM | POA: Diagnosis not present

## 2022-05-10 DIAGNOSIS — N2889 Other specified disorders of kidney and ureter: Secondary | ICD-10-CM | POA: Diagnosis not present

## 2022-05-10 DIAGNOSIS — J019 Acute sinusitis, unspecified: Secondary | ICD-10-CM | POA: Diagnosis not present

## 2022-05-11 DIAGNOSIS — R300 Dysuria: Secondary | ICD-10-CM | POA: Diagnosis not present

## 2022-05-22 DIAGNOSIS — L821 Other seborrheic keratosis: Secondary | ICD-10-CM | POA: Diagnosis not present

## 2022-05-22 DIAGNOSIS — L578 Other skin changes due to chronic exposure to nonionizing radiation: Secondary | ICD-10-CM | POA: Diagnosis not present

## 2022-05-22 DIAGNOSIS — L820 Inflamed seborrheic keratosis: Secondary | ICD-10-CM | POA: Diagnosis not present

## 2022-05-22 DIAGNOSIS — L570 Actinic keratosis: Secondary | ICD-10-CM | POA: Diagnosis not present

## 2022-05-29 DIAGNOSIS — I5030 Unspecified diastolic (congestive) heart failure: Secondary | ICD-10-CM | POA: Diagnosis not present

## 2022-05-29 DIAGNOSIS — I4891 Unspecified atrial fibrillation: Secondary | ICD-10-CM | POA: Diagnosis not present

## 2022-05-29 DIAGNOSIS — I10 Essential (primary) hypertension: Secondary | ICD-10-CM | POA: Diagnosis not present

## 2022-05-29 DIAGNOSIS — I2510 Atherosclerotic heart disease of native coronary artery without angina pectoris: Secondary | ICD-10-CM | POA: Diagnosis not present

## 2022-05-29 DIAGNOSIS — K921 Melena: Secondary | ICD-10-CM | POA: Diagnosis not present

## 2022-06-10 DIAGNOSIS — Z7982 Long term (current) use of aspirin: Secondary | ICD-10-CM | POA: Diagnosis not present

## 2022-06-10 DIAGNOSIS — M79605 Pain in left leg: Secondary | ICD-10-CM | POA: Diagnosis not present

## 2022-06-10 DIAGNOSIS — Z87891 Personal history of nicotine dependence: Secondary | ICD-10-CM | POA: Diagnosis not present

## 2022-06-10 DIAGNOSIS — M25552 Pain in left hip: Secondary | ICD-10-CM | POA: Diagnosis not present

## 2022-06-10 DIAGNOSIS — I2510 Atherosclerotic heart disease of native coronary artery without angina pectoris: Secondary | ICD-10-CM | POA: Diagnosis not present

## 2022-06-10 DIAGNOSIS — I10 Essential (primary) hypertension: Secondary | ICD-10-CM | POA: Diagnosis not present

## 2022-06-12 DIAGNOSIS — I480 Paroxysmal atrial fibrillation: Secondary | ICD-10-CM | POA: Diagnosis not present

## 2022-06-12 DIAGNOSIS — J309 Allergic rhinitis, unspecified: Secondary | ICD-10-CM | POA: Diagnosis not present

## 2022-06-12 DIAGNOSIS — I10 Essential (primary) hypertension: Secondary | ICD-10-CM | POA: Diagnosis not present

## 2022-06-12 DIAGNOSIS — M545 Low back pain, unspecified: Secondary | ICD-10-CM | POA: Diagnosis not present

## 2022-06-12 DIAGNOSIS — K219 Gastro-esophageal reflux disease without esophagitis: Secondary | ICD-10-CM | POA: Diagnosis not present

## 2022-06-12 DIAGNOSIS — J449 Chronic obstructive pulmonary disease, unspecified: Secondary | ICD-10-CM | POA: Diagnosis not present

## 2022-06-12 DIAGNOSIS — E7800 Pure hypercholesterolemia, unspecified: Secondary | ICD-10-CM | POA: Diagnosis not present

## 2022-06-12 DIAGNOSIS — L508 Other urticaria: Secondary | ICD-10-CM | POA: Diagnosis not present

## 2022-06-12 DIAGNOSIS — M5416 Radiculopathy, lumbar region: Secondary | ICD-10-CM | POA: Diagnosis not present

## 2022-06-12 DIAGNOSIS — N1831 Chronic kidney disease, stage 3a: Secondary | ICD-10-CM | POA: Diagnosis not present

## 2022-06-12 DIAGNOSIS — I2510 Atherosclerotic heart disease of native coronary artery without angina pectoris: Secondary | ICD-10-CM | POA: Diagnosis not present

## 2022-07-06 DIAGNOSIS — J449 Chronic obstructive pulmonary disease, unspecified: Secondary | ICD-10-CM | POA: Diagnosis not present

## 2022-07-06 DIAGNOSIS — J019 Acute sinusitis, unspecified: Secondary | ICD-10-CM | POA: Diagnosis not present

## 2022-07-06 DIAGNOSIS — J309 Allergic rhinitis, unspecified: Secondary | ICD-10-CM | POA: Diagnosis not present

## 2022-07-28 DIAGNOSIS — J309 Allergic rhinitis, unspecified: Secondary | ICD-10-CM | POA: Diagnosis not present

## 2022-07-28 DIAGNOSIS — R001 Bradycardia, unspecified: Secondary | ICD-10-CM | POA: Diagnosis not present

## 2022-07-28 DIAGNOSIS — Z6835 Body mass index (BMI) 35.0-35.9, adult: Secondary | ICD-10-CM | POA: Diagnosis not present

## 2022-07-28 DIAGNOSIS — R519 Headache, unspecified: Secondary | ICD-10-CM | POA: Diagnosis not present

## 2022-07-28 DIAGNOSIS — J449 Chronic obstructive pulmonary disease, unspecified: Secondary | ICD-10-CM | POA: Diagnosis not present

## 2022-08-17 DIAGNOSIS — I10 Essential (primary) hypertension: Secondary | ICD-10-CM | POA: Diagnosis not present

## 2022-08-17 DIAGNOSIS — M545 Low back pain, unspecified: Secondary | ICD-10-CM | POA: Diagnosis not present

## 2022-08-17 DIAGNOSIS — N1831 Chronic kidney disease, stage 3a: Secondary | ICD-10-CM | POA: Diagnosis not present

## 2022-08-17 DIAGNOSIS — L508 Other urticaria: Secondary | ICD-10-CM | POA: Diagnosis not present

## 2022-08-17 DIAGNOSIS — E7800 Pure hypercholesterolemia, unspecified: Secondary | ICD-10-CM | POA: Diagnosis not present

## 2022-08-17 DIAGNOSIS — I480 Paroxysmal atrial fibrillation: Secondary | ICD-10-CM | POA: Diagnosis not present

## 2022-08-17 DIAGNOSIS — K219 Gastro-esophageal reflux disease without esophagitis: Secondary | ICD-10-CM | POA: Diagnosis not present

## 2022-08-17 DIAGNOSIS — J309 Allergic rhinitis, unspecified: Secondary | ICD-10-CM | POA: Diagnosis not present

## 2022-08-17 DIAGNOSIS — J449 Chronic obstructive pulmonary disease, unspecified: Secondary | ICD-10-CM | POA: Diagnosis not present

## 2022-08-17 DIAGNOSIS — I2510 Atherosclerotic heart disease of native coronary artery without angina pectoris: Secondary | ICD-10-CM | POA: Diagnosis not present

## 2022-09-25 DIAGNOSIS — L820 Inflamed seborrheic keratosis: Secondary | ICD-10-CM | POA: Diagnosis not present

## 2022-09-25 DIAGNOSIS — L570 Actinic keratosis: Secondary | ICD-10-CM | POA: Diagnosis not present

## 2022-09-25 DIAGNOSIS — L821 Other seborrheic keratosis: Secondary | ICD-10-CM | POA: Diagnosis not present

## 2022-10-08 DIAGNOSIS — Z955 Presence of coronary angioplasty implant and graft: Secondary | ICD-10-CM | POA: Diagnosis not present

## 2022-10-08 DIAGNOSIS — I4891 Unspecified atrial fibrillation: Secondary | ICD-10-CM | POA: Diagnosis not present

## 2022-10-08 DIAGNOSIS — N189 Chronic kidney disease, unspecified: Secondary | ICD-10-CM | POA: Diagnosis not present

## 2022-10-08 DIAGNOSIS — I2510 Atherosclerotic heart disease of native coronary artery without angina pectoris: Secondary | ICD-10-CM | POA: Diagnosis not present

## 2022-10-08 DIAGNOSIS — R9431 Abnormal electrocardiogram [ECG] [EKG]: Secondary | ICD-10-CM | POA: Diagnosis not present

## 2022-10-08 DIAGNOSIS — S39012A Strain of muscle, fascia and tendon of lower back, initial encounter: Secondary | ICD-10-CM | POA: Diagnosis not present

## 2022-10-08 DIAGNOSIS — Z7901 Long term (current) use of anticoagulants: Secondary | ICD-10-CM | POA: Diagnosis not present

## 2022-10-08 DIAGNOSIS — J449 Chronic obstructive pulmonary disease, unspecified: Secondary | ICD-10-CM | POA: Diagnosis not present

## 2022-10-08 DIAGNOSIS — Z79899 Other long term (current) drug therapy: Secondary | ICD-10-CM | POA: Diagnosis not present

## 2022-10-08 DIAGNOSIS — I129 Hypertensive chronic kidney disease with stage 1 through stage 4 chronic kidney disease, or unspecified chronic kidney disease: Secondary | ICD-10-CM | POA: Diagnosis not present

## 2022-10-11 DIAGNOSIS — M545 Low back pain, unspecified: Secondary | ICD-10-CM | POA: Diagnosis not present

## 2022-10-11 DIAGNOSIS — Z139 Encounter for screening, unspecified: Secondary | ICD-10-CM | POA: Diagnosis not present

## 2022-10-14 DIAGNOSIS — S39012A Strain of muscle, fascia and tendon of lower back, initial encounter: Secondary | ICD-10-CM | POA: Diagnosis not present

## 2022-10-23 DIAGNOSIS — I480 Paroxysmal atrial fibrillation: Secondary | ICD-10-CM | POA: Diagnosis not present

## 2022-10-23 DIAGNOSIS — S32000A Wedge compression fracture of unspecified lumbar vertebra, initial encounter for closed fracture: Secondary | ICD-10-CM | POA: Diagnosis not present

## 2022-10-23 DIAGNOSIS — M810 Age-related osteoporosis without current pathological fracture: Secondary | ICD-10-CM | POA: Diagnosis not present

## 2022-10-27 DIAGNOSIS — A498 Other bacterial infections of unspecified site: Secondary | ICD-10-CM | POA: Diagnosis not present

## 2022-10-27 DIAGNOSIS — N390 Urinary tract infection, site not specified: Secondary | ICD-10-CM | POA: Diagnosis not present

## 2022-11-08 DIAGNOSIS — S32000A Wedge compression fracture of unspecified lumbar vertebra, initial encounter for closed fracture: Secondary | ICD-10-CM | POA: Diagnosis not present

## 2022-11-08 DIAGNOSIS — M810 Age-related osteoporosis without current pathological fracture: Secondary | ICD-10-CM | POA: Diagnosis not present

## 2022-11-08 DIAGNOSIS — M5416 Radiculopathy, lumbar region: Secondary | ICD-10-CM | POA: Diagnosis not present

## 2022-11-09 DIAGNOSIS — Z139 Encounter for screening, unspecified: Secondary | ICD-10-CM | POA: Diagnosis not present

## 2022-11-09 DIAGNOSIS — Z1331 Encounter for screening for depression: Secondary | ICD-10-CM | POA: Diagnosis not present

## 2022-11-09 DIAGNOSIS — Z9181 History of falling: Secondary | ICD-10-CM | POA: Diagnosis not present

## 2022-11-09 DIAGNOSIS — Z1231 Encounter for screening mammogram for malignant neoplasm of breast: Secondary | ICD-10-CM | POA: Diagnosis not present

## 2022-11-09 DIAGNOSIS — Z0000 Encounter for general adult medical examination without abnormal findings: Secondary | ICD-10-CM | POA: Diagnosis not present

## 2022-11-23 DIAGNOSIS — I10 Essential (primary) hypertension: Secondary | ICD-10-CM | POA: Diagnosis not present

## 2022-11-23 DIAGNOSIS — J309 Allergic rhinitis, unspecified: Secondary | ICD-10-CM | POA: Diagnosis not present

## 2022-11-23 DIAGNOSIS — I2510 Atherosclerotic heart disease of native coronary artery without angina pectoris: Secondary | ICD-10-CM | POA: Diagnosis not present

## 2022-11-23 DIAGNOSIS — I480 Paroxysmal atrial fibrillation: Secondary | ICD-10-CM | POA: Diagnosis not present

## 2022-11-23 DIAGNOSIS — S32000A Wedge compression fracture of unspecified lumbar vertebra, initial encounter for closed fracture: Secondary | ICD-10-CM | POA: Diagnosis not present

## 2022-11-23 DIAGNOSIS — K219 Gastro-esophageal reflux disease without esophagitis: Secondary | ICD-10-CM | POA: Diagnosis not present

## 2022-11-23 DIAGNOSIS — M545 Low back pain, unspecified: Secondary | ICD-10-CM | POA: Diagnosis not present

## 2022-11-23 DIAGNOSIS — N1831 Chronic kidney disease, stage 3a: Secondary | ICD-10-CM | POA: Diagnosis not present

## 2022-11-23 DIAGNOSIS — L508 Other urticaria: Secondary | ICD-10-CM | POA: Diagnosis not present

## 2022-11-23 DIAGNOSIS — J449 Chronic obstructive pulmonary disease, unspecified: Secondary | ICD-10-CM | POA: Diagnosis not present

## 2022-11-23 DIAGNOSIS — M5416 Radiculopathy, lumbar region: Secondary | ICD-10-CM | POA: Diagnosis not present

## 2022-11-23 DIAGNOSIS — E7800 Pure hypercholesterolemia, unspecified: Secondary | ICD-10-CM | POA: Diagnosis not present

## 2022-12-23 DIAGNOSIS — M5116 Intervertebral disc disorders with radiculopathy, lumbar region: Secondary | ICD-10-CM | POA: Diagnosis not present

## 2022-12-23 DIAGNOSIS — M4727 Other spondylosis with radiculopathy, lumbosacral region: Secondary | ICD-10-CM | POA: Diagnosis not present

## 2022-12-23 DIAGNOSIS — M4726 Other spondylosis with radiculopathy, lumbar region: Secondary | ICD-10-CM | POA: Diagnosis not present

## 2022-12-23 DIAGNOSIS — M48061 Spinal stenosis, lumbar region without neurogenic claudication: Secondary | ICD-10-CM | POA: Diagnosis not present

## 2022-12-23 DIAGNOSIS — M4316 Spondylolisthesis, lumbar region: Secondary | ICD-10-CM | POA: Diagnosis not present

## 2022-12-25 DIAGNOSIS — M5416 Radiculopathy, lumbar region: Secondary | ICD-10-CM | POA: Diagnosis not present

## 2022-12-25 DIAGNOSIS — M810 Age-related osteoporosis without current pathological fracture: Secondary | ICD-10-CM | POA: Diagnosis not present

## 2022-12-25 DIAGNOSIS — S32000A Wedge compression fracture of unspecified lumbar vertebra, initial encounter for closed fracture: Secondary | ICD-10-CM | POA: Diagnosis not present

## 2023-01-15 DIAGNOSIS — M549 Dorsalgia, unspecified: Secondary | ICD-10-CM | POA: Diagnosis not present

## 2023-01-15 DIAGNOSIS — Z87891 Personal history of nicotine dependence: Secondary | ICD-10-CM | POA: Diagnosis not present

## 2023-01-15 DIAGNOSIS — R7989 Other specified abnormal findings of blood chemistry: Secondary | ICD-10-CM | POA: Diagnosis not present

## 2023-01-15 DIAGNOSIS — M8000XD Age-related osteoporosis with current pathological fracture, unspecified site, subsequent encounter for fracture with routine healing: Secondary | ICD-10-CM | POA: Diagnosis not present

## 2023-01-15 DIAGNOSIS — I4891 Unspecified atrial fibrillation: Secondary | ICD-10-CM | POA: Diagnosis not present

## 2023-01-15 DIAGNOSIS — Z1152 Encounter for screening for COVID-19: Secondary | ICD-10-CM | POA: Diagnosis not present

## 2023-01-15 DIAGNOSIS — Z7901 Long term (current) use of anticoagulants: Secondary | ICD-10-CM | POA: Diagnosis not present

## 2023-01-15 DIAGNOSIS — R918 Other nonspecific abnormal finding of lung field: Secondary | ICD-10-CM | POA: Diagnosis not present

## 2023-01-15 DIAGNOSIS — K760 Fatty (change of) liver, not elsewhere classified: Secondary | ICD-10-CM | POA: Diagnosis not present

## 2023-01-15 DIAGNOSIS — I5033 Acute on chronic diastolic (congestive) heart failure: Secondary | ICD-10-CM | POA: Diagnosis not present

## 2023-01-15 DIAGNOSIS — Z7982 Long term (current) use of aspirin: Secondary | ICD-10-CM | POA: Diagnosis not present

## 2023-01-15 DIAGNOSIS — R0602 Shortness of breath: Secondary | ICD-10-CM | POA: Diagnosis not present

## 2023-01-15 DIAGNOSIS — I110 Hypertensive heart disease with heart failure: Secondary | ICD-10-CM | POA: Diagnosis not present

## 2023-01-15 DIAGNOSIS — I517 Cardiomegaly: Secondary | ICD-10-CM | POA: Diagnosis not present

## 2023-01-15 DIAGNOSIS — Z79899 Other long term (current) drug therapy: Secondary | ICD-10-CM | POA: Diagnosis not present

## 2023-01-15 DIAGNOSIS — I5022 Chronic systolic (congestive) heart failure: Secondary | ICD-10-CM | POA: Diagnosis not present

## 2023-01-15 DIAGNOSIS — Z791 Long term (current) use of non-steroidal anti-inflammatories (NSAID): Secondary | ICD-10-CM | POA: Diagnosis not present

## 2023-01-15 DIAGNOSIS — I2510 Atherosclerotic heart disease of native coronary artery without angina pectoris: Secondary | ICD-10-CM

## 2023-01-15 DIAGNOSIS — Z8673 Personal history of transient ischemic attack (TIA), and cerebral infarction without residual deficits: Secondary | ICD-10-CM | POA: Diagnosis not present

## 2023-01-15 DIAGNOSIS — I351 Nonrheumatic aortic (valve) insufficiency: Secondary | ICD-10-CM | POA: Diagnosis not present

## 2023-01-15 DIAGNOSIS — Z955 Presence of coronary angioplasty implant and graft: Secondary | ICD-10-CM | POA: Diagnosis not present

## 2023-01-15 DIAGNOSIS — K118 Other diseases of salivary glands: Secondary | ICD-10-CM | POA: Diagnosis not present

## 2023-01-15 DIAGNOSIS — I272 Pulmonary hypertension, unspecified: Secondary | ICD-10-CM | POA: Diagnosis not present

## 2023-01-15 DIAGNOSIS — J811 Chronic pulmonary edema: Secondary | ICD-10-CM | POA: Diagnosis not present

## 2023-01-15 DIAGNOSIS — R0600 Dyspnea, unspecified: Secondary | ICD-10-CM | POA: Diagnosis not present

## 2023-01-15 DIAGNOSIS — Z66 Do not resuscitate: Secondary | ICD-10-CM | POA: Diagnosis not present

## 2023-01-15 DIAGNOSIS — M1990 Unspecified osteoarthritis, unspecified site: Secondary | ICD-10-CM | POA: Diagnosis not present

## 2023-01-15 DIAGNOSIS — I3481 Nonrheumatic mitral (valve) annulus calcification: Secondary | ICD-10-CM | POA: Diagnosis not present

## 2023-01-15 HISTORY — DX: Atherosclerotic heart disease of native coronary artery without angina pectoris: I25.10

## 2023-01-16 DIAGNOSIS — Z8673 Personal history of transient ischemic attack (TIA), and cerebral infarction without residual deficits: Secondary | ICD-10-CM | POA: Diagnosis not present

## 2023-01-16 DIAGNOSIS — M549 Dorsalgia, unspecified: Secondary | ICD-10-CM | POA: Diagnosis not present

## 2023-01-16 DIAGNOSIS — I110 Hypertensive heart disease with heart failure: Secondary | ICD-10-CM | POA: Diagnosis not present

## 2023-01-16 DIAGNOSIS — M25559 Pain in unspecified hip: Secondary | ICD-10-CM | POA: Diagnosis not present

## 2023-01-16 DIAGNOSIS — Z7901 Long term (current) use of anticoagulants: Secondary | ICD-10-CM | POA: Diagnosis not present

## 2023-01-16 DIAGNOSIS — I5020 Unspecified systolic (congestive) heart failure: Secondary | ICD-10-CM | POA: Diagnosis not present

## 2023-01-16 DIAGNOSIS — K760 Fatty (change of) liver, not elsewhere classified: Secondary | ICD-10-CM | POA: Diagnosis not present

## 2023-01-16 DIAGNOSIS — Z0389 Encounter for observation for other suspected diseases and conditions ruled out: Secondary | ICD-10-CM | POA: Diagnosis not present

## 2023-01-16 DIAGNOSIS — R7401 Elevation of levels of liver transaminase levels: Secondary | ICD-10-CM | POA: Diagnosis not present

## 2023-01-16 DIAGNOSIS — Z66 Do not resuscitate: Secondary | ICD-10-CM | POA: Diagnosis not present

## 2023-01-16 DIAGNOSIS — I5030 Unspecified diastolic (congestive) heart failure: Secondary | ICD-10-CM | POA: Diagnosis not present

## 2023-01-16 DIAGNOSIS — R0602 Shortness of breath: Secondary | ICD-10-CM | POA: Diagnosis not present

## 2023-01-16 DIAGNOSIS — M8008XD Age-related osteoporosis with current pathological fracture, vertebra(e), subsequent encounter for fracture with routine healing: Secondary | ICD-10-CM | POA: Diagnosis not present

## 2023-01-16 DIAGNOSIS — J449 Chronic obstructive pulmonary disease, unspecified: Secondary | ICD-10-CM | POA: Diagnosis not present

## 2023-01-16 DIAGNOSIS — I5033 Acute on chronic diastolic (congestive) heart failure: Secondary | ICD-10-CM | POA: Diagnosis not present

## 2023-01-16 DIAGNOSIS — M1990 Unspecified osteoarthritis, unspecified site: Secondary | ICD-10-CM | POA: Diagnosis not present

## 2023-01-16 DIAGNOSIS — I2510 Atherosclerotic heart disease of native coronary artery without angina pectoris: Secondary | ICD-10-CM | POA: Diagnosis not present

## 2023-01-16 DIAGNOSIS — R791 Abnormal coagulation profile: Secondary | ICD-10-CM | POA: Diagnosis not present

## 2023-01-16 DIAGNOSIS — Z791 Long term (current) use of non-steroidal anti-inflammatories (NSAID): Secondary | ICD-10-CM | POA: Diagnosis not present

## 2023-01-16 DIAGNOSIS — K118 Other diseases of salivary glands: Secondary | ICD-10-CM | POA: Diagnosis not present

## 2023-01-16 DIAGNOSIS — Z1152 Encounter for screening for COVID-19: Secondary | ICD-10-CM | POA: Diagnosis not present

## 2023-01-16 DIAGNOSIS — R7402 Elevation of levels of lactic acid dehydrogenase (LDH): Secondary | ICD-10-CM | POA: Diagnosis not present

## 2023-01-16 DIAGNOSIS — M8000XD Age-related osteoporosis with current pathological fracture, unspecified site, subsequent encounter for fracture with routine healing: Secondary | ICD-10-CM | POA: Diagnosis not present

## 2023-01-16 DIAGNOSIS — I4891 Unspecified atrial fibrillation: Secondary | ICD-10-CM | POA: Diagnosis not present

## 2023-01-16 DIAGNOSIS — I351 Nonrheumatic aortic (valve) insufficiency: Secondary | ICD-10-CM | POA: Diagnosis not present

## 2023-01-16 DIAGNOSIS — Z955 Presence of coronary angioplasty implant and graft: Secondary | ICD-10-CM | POA: Diagnosis not present

## 2023-01-16 DIAGNOSIS — F17210 Nicotine dependence, cigarettes, uncomplicated: Secondary | ICD-10-CM | POA: Diagnosis not present

## 2023-01-16 DIAGNOSIS — Z7982 Long term (current) use of aspirin: Secondary | ICD-10-CM | POA: Diagnosis not present

## 2023-01-16 DIAGNOSIS — I10 Essential (primary) hypertension: Secondary | ICD-10-CM | POA: Diagnosis not present

## 2023-01-16 DIAGNOSIS — R0609 Other forms of dyspnea: Secondary | ICD-10-CM | POA: Diagnosis not present

## 2023-01-16 DIAGNOSIS — R7989 Other specified abnormal findings of blood chemistry: Secondary | ICD-10-CM | POA: Diagnosis not present

## 2023-01-16 DIAGNOSIS — Z79899 Other long term (current) drug therapy: Secondary | ICD-10-CM | POA: Diagnosis not present

## 2023-01-16 DIAGNOSIS — I272 Pulmonary hypertension, unspecified: Secondary | ICD-10-CM | POA: Diagnosis not present

## 2023-01-16 DIAGNOSIS — Z87891 Personal history of nicotine dependence: Secondary | ICD-10-CM | POA: Diagnosis not present

## 2023-01-16 DIAGNOSIS — R0600 Dyspnea, unspecified: Secondary | ICD-10-CM | POA: Diagnosis not present

## 2023-01-16 DIAGNOSIS — Z8719 Personal history of other diseases of the digestive system: Secondary | ICD-10-CM | POA: Diagnosis not present

## 2023-01-17 DIAGNOSIS — I110 Hypertensive heart disease with heart failure: Secondary | ICD-10-CM | POA: Diagnosis not present

## 2023-01-17 DIAGNOSIS — M549 Dorsalgia, unspecified: Secondary | ICD-10-CM | POA: Diagnosis not present

## 2023-01-17 DIAGNOSIS — R0600 Dyspnea, unspecified: Secondary | ICD-10-CM | POA: Diagnosis not present

## 2023-01-17 DIAGNOSIS — I272 Pulmonary hypertension, unspecified: Secondary | ICD-10-CM | POA: Diagnosis not present

## 2023-01-17 DIAGNOSIS — R7402 Elevation of levels of lactic acid dehydrogenase (LDH): Secondary | ICD-10-CM | POA: Diagnosis not present

## 2023-01-17 DIAGNOSIS — M25559 Pain in unspecified hip: Secondary | ICD-10-CM | POA: Diagnosis not present

## 2023-01-17 DIAGNOSIS — M8008XD Age-related osteoporosis with current pathological fracture, vertebra(e), subsequent encounter for fracture with routine healing: Secondary | ICD-10-CM | POA: Diagnosis not present

## 2023-01-17 DIAGNOSIS — I4891 Unspecified atrial fibrillation: Secondary | ICD-10-CM | POA: Diagnosis not present

## 2023-01-17 DIAGNOSIS — R791 Abnormal coagulation profile: Secondary | ICD-10-CM | POA: Diagnosis not present

## 2023-01-17 DIAGNOSIS — I2510 Atherosclerotic heart disease of native coronary artery without angina pectoris: Secondary | ICD-10-CM | POA: Diagnosis not present

## 2023-01-17 DIAGNOSIS — I5020 Unspecified systolic (congestive) heart failure: Secondary | ICD-10-CM | POA: Diagnosis not present

## 2023-01-17 DIAGNOSIS — Z8719 Personal history of other diseases of the digestive system: Secondary | ICD-10-CM | POA: Diagnosis not present

## 2023-01-18 DIAGNOSIS — M8008XD Age-related osteoporosis with current pathological fracture, vertebra(e), subsequent encounter for fracture with routine healing: Secondary | ICD-10-CM | POA: Diagnosis not present

## 2023-01-18 DIAGNOSIS — Z79899 Other long term (current) drug therapy: Secondary | ICD-10-CM | POA: Diagnosis not present

## 2023-01-18 DIAGNOSIS — F17210 Nicotine dependence, cigarettes, uncomplicated: Secondary | ICD-10-CM | POA: Diagnosis not present

## 2023-01-18 DIAGNOSIS — R7401 Elevation of levels of liver transaminase levels: Secondary | ICD-10-CM | POA: Diagnosis not present

## 2023-01-18 DIAGNOSIS — I272 Pulmonary hypertension, unspecified: Secondary | ICD-10-CM | POA: Diagnosis not present

## 2023-01-18 DIAGNOSIS — J449 Chronic obstructive pulmonary disease, unspecified: Secondary | ICD-10-CM | POA: Diagnosis not present

## 2023-01-18 DIAGNOSIS — I5030 Unspecified diastolic (congestive) heart failure: Secondary | ICD-10-CM | POA: Diagnosis not present

## 2023-01-18 DIAGNOSIS — I2510 Atherosclerotic heart disease of native coronary artery without angina pectoris: Secondary | ICD-10-CM | POA: Diagnosis not present

## 2023-01-18 DIAGNOSIS — Z8673 Personal history of transient ischemic attack (TIA), and cerebral infarction without residual deficits: Secondary | ICD-10-CM | POA: Diagnosis not present

## 2023-01-18 DIAGNOSIS — I4891 Unspecified atrial fibrillation: Secondary | ICD-10-CM | POA: Diagnosis not present

## 2023-01-18 DIAGNOSIS — I110 Hypertensive heart disease with heart failure: Secondary | ICD-10-CM | POA: Diagnosis not present

## 2023-01-18 DIAGNOSIS — K118 Other diseases of salivary glands: Secondary | ICD-10-CM | POA: Diagnosis not present

## 2023-01-19 DIAGNOSIS — I10 Essential (primary) hypertension: Secondary | ICD-10-CM | POA: Diagnosis not present

## 2023-01-19 DIAGNOSIS — R0609 Other forms of dyspnea: Secondary | ICD-10-CM | POA: Diagnosis not present

## 2023-01-19 DIAGNOSIS — I272 Pulmonary hypertension, unspecified: Secondary | ICD-10-CM | POA: Diagnosis not present

## 2023-01-19 DIAGNOSIS — I4891 Unspecified atrial fibrillation: Secondary | ICD-10-CM | POA: Diagnosis not present

## 2023-01-19 NOTE — Discharge Summary (Signed)
 ------------------------------------------------------------------------------- Attestation signed by Seena Thom Duncans, MD at 01/19/23 1352 I saw and evaluated the patient, participating in the key portions of the service.  I reviewed the resident's note.  I agree with the resident's findings and plan with the below additions:   Patient is being discharged on Lasix  80 mg PO QD given improvement overall.   Thom Duncans Seena, MD -------------------------------------------------------------------------------   [facility] Service Physician Discharge Summary   Admit date: 01/15/2023 Discharge date:  Discharge Service: [facility] Service Admitting Provider: Lynwood Mitchell Rake, MD Discharge Attending Physician: Lynwood Mitchell Rake, MD Discharge to: Home Consults:  PCP: Montey [place], [facility]  Discharge Diagnoses:  Principal Problem:   Dyspnea Active Problems:   Atrial fibrillation (CMS-HCC)   Hypertension   Vertebral compression fracture (CMS-HCC)   Presence of stent in coronary artery in patient with coronary artery disease   Parotid mass   History of TIA (transient ischemic attack)   Pulmonary hypertension (CMS-HCC)    Hospital Course:    PCP Follow Up:  [ ]  outpatient cards and pulm fu [ ]  consider BMP outpatient iso lasix  [ ]  outpatient eval for COPD [ ]  LFT abnormalities follow-up [ ]  outpatient ENT fu for parotid mass  Hospitalization Course  Dyspnea  HF exacerbation vs pHTN exacerbation. 12F with hypertension and HFpEF (dx by cardiologist) p/w acute onset dyspnea. CTA with findings of RV dysfunction but no PE.  ECHO demonstrating moderate pulmonary hypertension and EF of 45 to 50%.  Highest on differential is HFmrEF exacerbation or pHTN exacerbation. Although there is no evidence of volume overload on exam, she we diuresed with IV 40mg  lasix  through 10/30 and increased to 80mg  IV lasix  for improved diuresis in the setting of ongoing crackles.  -  Continue lasix  80mg  every day outpatient - Outpatient cardiology (HFpEF) and pulmonology follow-up (pulmonary HTN)   #Atrial Fibrillation (on Eliquis ). Home regimen of metoprolol  50mg  every day and Diltiazem  180mg  every day and Eliquis  5mg  BID. Continued outpatient.    #Hypertension. Home regimen of losartan 50mg  every day, metoprolol  and diltiazem  (for atrial fibrillation).  Continue home regimen.   #History of reactive airway disease. Pt reports hx of COPD, very remote cigarette use, and infrequently uses her inhalers.  Will need further workup for any COPD officially outpatient but will continue her Incruse Ellipta  inpatient and add DuoNeb as needed - Continue Incruse Ellipta - Duoneb q4h PRN   # Back and hip pain  Osteoporotic vertebral compression fracture.  Recently got workup for back pain which revealed compression fractures in her spine.  Per PCP, she declined medications for osteoporosis.  She has been on Tylenol , gabapentin for her pain. - Continue Tylenol  1000 mg every 8 hours scheduled, gabapentin 300 mg 3 times daily   #CAD s/p stent placement. Obtained Cardiology records (Cardiology [facility] [place], [place]). S/p stent placement in proximal LAD in 2003.  - Continue ASA 81mg  every day.    #Elevated liver enzymes. Elevated total bilirubin and AST present; normal platelets and INR. Historical dx of mild fatty liver disease.  - Outpatient follow-up   #Hx of TIA. MRI negative for infarct.  - Ezetimbie 10mg  every day    #Parotid mass. Noted incidentally on prior CT imaging.  - Outpatient ENT follow-up   Procedures:   None  ___________________________________________________________________ Discharge Day Services:   Pt seen on the day of discharge and determined appropriate for discharge.  Day of Discharge Services:  Subjective:  Interval History: Doing well. Ready to go home.  ROS negative for fever, chills, night sweats, nausea, vomiting, diarrhea, constipation, chest  pain, SOB, DOE, Abdominal pain, dysuria, rash, vision changes, headache, unilateral weakness, numbness or tingling  Objective:  Vital signs in last 24 hours: Temp:  [36.3 C (97.4 F)-36.5 C (97.7 F)] 36.5 C (97.7 F) Heart Rate:  [47-109] 109 Resp:  [16-19] 19 BP: (121-126)/(68-88) 126/70 MAP (mmHg):  [84-98] 88 FiO2 (%):  [21 %-24 %] 21 % SpO2:  [91 %-96 %] 94 %  Intake/Output last 3 shifts: I/O last 3 completed shifts: In: 555 [P.O.:555] Out: 700 [Urine:700]  Weight:  Wt Readings from Last 4 Encounters:  01/19/23 75.8 kg (167 lb 3.2 oz)  10/08/22 84 kg (185 lb 3.2 oz)  06/10/22 83 kg (183 lb)  10/16/13 73.5 kg (162 lb)    Physical Exam Vitals reviewed.  Constitutional:      General: She is not in acute distress.    Comments: On RA  Cardiovascular:     Rate and Rhythm: Normal rate.  Pulmonary:     Effort: Pulmonary effort is normal. No respiratory distress.     Comments: Faint crackles Musculoskeletal:        General: No swelling.  Neurological:     Mental Status: She is alert.     Condition at Discharge: good  Length of Discharge: I spent greater than 30 mins in the discharge of this patient. ___________________________________________________________________ Discharge Medications:      Your Medication List     START taking these medications    furosemide  80 MG tablet Commonly known as: LASIX  Take 1 tablet (80 mg total) by mouth daily. Start taking on: January 20, 2023       CONTINUE taking these medications    albuterol 90 mcg/actuation inhaler Commonly known as: PROVENTIL HFA;VENTOLIN HFA Inhale 2 puffs every four (4) hours as needed.   aspirin 81 MG chewable tablet Chew 81 mg daily.   diclofenac sodium 1 % gel Commonly known as: VOLTAREN Apply 2 g topically four (4) times a day.   DILT-XR 180 mg 24 hr capsule Generic drug: dilTIAZem  Take 1 capsule (180 mg total) by mouth daily.   ELIQUIS  5 mg Tab Generic drug: apixaban  Take 1  tablet (5 mg total) by mouth two (2) times a day.   ezetimibe  10 mg tablet Commonly known as: ZETIA  Take 1 tablet (10 mg total) by mouth daily.   losartan 50 MG tablet Commonly known as: COZAAR Take 1 tablet (50 mg total) by mouth daily.   metoPROLOL  succinate 50 MG 24 hr tablet Commonly known as: Toprol -XL Take 1 tablet (50 mg total) by mouth daily.   omeprazole 20 MG capsule Commonly known as: PriLOSEC Take 1 capsule (20 mg total) by mouth daily.       ___________________________________________________________________ Hospital Tests/Labs:   Pending Test Results (if blank, then none):  Hospital Radiology: PVL Venous Duplex Lower Extremity Left  Result Date: 01/18/2023 EXAM: PVL VENOUS DUPLEX LOWER EXTREMITY LEFT DATE: 01/18/2023 ACCESSION: [id number] [facility] DICTATED: 01/18/2023 12:57 PM INTERPRETATION LOCATION: MAIN CAMPUS CLINICAL INDICATION: 89 years old Female with concern for dvt  TECHNIQUE: The left common femoral, femoral, popliteal, posterior tibial and peroneal veins were evaluated for the presence of intraluminal obstruction.  Transverse views were used to assess venous compressibility. Longitudinal orientation was used for Doppler assessment of hemodynamic flow characteristics.      FINDINGS: The left common femoral, femoral, popliteal, posterior tibial, and peroneal veins were compressible. Doppler signals were obtained noting normal spontaneity and phasicity. The  contralateral right common femoral vein was compressible, spontaneous and phasic.   No deep vein thrombosis in the left lower extremity.   ECG 12 Lead  Result Date: 01/16/2023 ATRIAL FIBRILLATION WITH RAPID VENTRICULAR RESPONSE NONSPECIFIC ST AND T WAVE ABNORMALITY ABNORMAL ECG WHEN COMPARED WITH ECG OF 15-Jan-2023 06:39, QUESTIONABLE CHANGE IN QRS AXIS  Echocardiogram W Colorflow Spectral Doppler  Result Date: 01/15/2023 Patient Info Name:     Morgan Jacobs Age:     89 years DOB:     April 09, 1933 Gender:      Female MRN:     [id number] Accession #:     [id number] [facility] Account #:     [id number] Ht:     152 cm Wt:     79 kg BSA:     1.86 m2 Exam Date:     01/15/2023 1:12 PM Admit Date:     01/15/2023 Exam Type:     ECHOCARDIOGRAM W COLORFLOW SPECTRAL DOPPLER Technical Quality:     Fair Staff Sonographer:     Joy Stingl BS, RDCS, RVT Study Info Indications      - Chronic Systolic CHF,  ACute SOB Procedure(s)   Complete two-dimensional, color flow and Doppler transthoracic echocardiogram is performed. Summary   1. The left ventricle is normal in size with normal wall thickness.   2. The left ventricular systolic function is mildly decreased, LVEF is visually estimated at 45-50%.   3. The right ventricle is normal in size, with mildly reduced systolic function.   4. The left atrium is moderately dilated in size.   5. The right atrium is moderately dilated in size.   6. There is mild mitral valve regurgitation.   7. There is mild aortic regurgitation.   8. There is moderate pulmonary hypertension.   9. IVC size and inspiratory change suggest elevated right atrial pressure. (10-20 mmHg). Left Ventricle   The left ventricle is normal in size with normal wall thickness. The left ventricular systolic function is mildly decreased, LVEF is visually estimated at 45-50%. Left ventricular diastolic function cannot be accurately assessed. Right Ventricle   The right ventricle is normal in size, with mildly reduced systolic function. Left Atrium   The left atrium is moderately dilated in size. Right Atrium   The right atrium is moderately dilated in size. Aortic Valve   The aortic valve is trileaflet with mildly thickened leaflets with normal excursion. There is mild aortic regurgitation. There is no evidence of a significant transvalvular gradient. Mitral Valve   The mitral valve leaflets are moderately thickened with normal leaflet mobility. Mitral annular calcification is present (moderate). There is mild mitral valve regurgitation.  Tricuspid Valve   The tricuspid valve leaflets are normal, with normal leaflet mobility. There is mild tricuspid regurgitation. There is moderate pulmonary hypertension. TR maximum velocity: 3.4 m/s  Estimated PASP: 61 mmHg. Pulmonic Valve   The pulmonic valve is poorly visualized, but probably normal. There is trivial pulmonic regurgitation. There is no evidence of a significant transvalvular gradient. Aorta   The aorta is normal in size in the visualized segments. Inferior Vena Cava   IVC size and inspiratory change suggest elevated right atrial pressure. (10-20 mmHg). Pericardium/Pleural   There is no pericardial effusion. Other Findings   Rhythm: Atrial Fibrillation. Ventricles ---------------------------------------------------------------------- Name                                 Value  Normal ---------------------------------------------------------------------- LV Dimensions 2D/MM ----------------------------------------------------------------------  IVS Diastolic Thickness (2D)                                1.1 cm       0.6-0.9 LVID Diastole (2D)                  4.6 cm       3.8-5.2  LVPW Diastolic Thickness (2D)                                1.0 cm       0.6-0.9 LVID Systole (2D)                   3.7 cm       2.2-3.5 LV Mass Index (2D Cubed)           91 g/m2         43-95  Relative Wall Thickness (2D)                                  0.43               RV Dimensions 2D/MM ---------------------------------------------------------------------- TAPSE                               1.3 cm         >=1.7 Atria ---------------------------------------------------------------------- Name                                 Value        Normal ---------------------------------------------------------------------- LA Dimensions ---------------------------------------------------------------------- LA Dimension (2D)                   3.7 cm       2.7-3.8 LA Volume Index (4C A-L)        41.70 ml/m2                RA Dimensions ---------------------------------------------------------------------- RA Area (4C)                      25.3 cm2        <=18.0 RA Area (4C) Index              13.6 cm2/m2               RA ESV Index (4C MOD)             44 ml/m2         15-27 Aortic Valve ---------------------------------------------------------------------- Name                                 Value        Normal ---------------------------------------------------------------------- AV Doppler ---------------------------------------------------------------------- AV Peak Velocity                   1.2 m/s               AV Peak Gradient                    6 mmHg Mitral Valve ---------------------------------------------------------------------- Name  Value        Normal ---------------------------------------------------------------------- MV Regurgitation Doppler ---------------------------------------------------------------------- MR Peak Velocity                   4.8 m/s Tricuspid Valve ---------------------------------------------------------------------- Name                                 Value        Normal ---------------------------------------------------------------------- TV Regurgitation Doppler ---------------------------------------------------------------------- TR Peak Velocity                   3.4 m/s               Estimated PAP/RSVP ---------------------------------------------------------------------- RA Pressure                        15 mmHg           <=5 RV Systolic Pressure               61 mmHg           <36 Pulmonic Valve ---------------------------------------------------------------------- Name                                 Value        Normal ---------------------------------------------------------------------- PV Doppler ---------------------------------------------------------------------- PV Peak Velocity                   0.7 m/s Aorta  ---------------------------------------------------------------------- Name                                 Value        Normal ---------------------------------------------------------------------- Ascending Aorta ---------------------------------------------------------------------- Ao Root Diameter (2D)               2.8 cm               Ao Root Diam Index (2D)          1.5 cm/m2 Report Signatures Finalized by Liza Beaver  MD on 01/15/2023 02:09 PM  CTA Chest W Contrast  Result Date: 01/15/2023 EXAM: CTA CHEST W CONTRAST ACCESSION: [id number] [facility] CLINICAL INDICATION: elevated d dimer, SOB TECHNIQUE: Contiguous axial images were reconstructed through the chest following a single breath hold helical acquisition during the administration of intravenous contrast material. Images were reformatted in the coronal and sagittal planes. MIP slabs were also constructed. COMPARISON: None. FINDINGS: PULMONARY ARTERIES: No emboli in either lung. Normal caliber main pulmonary artery measuring 3 cm HEART AND VASCULATURE: Biatrial dilatation.. Moderate coronary artery calcifications. No pericardial effusion. Tortuous aorta is normal in caliber with mild calcifications. Mitral annular calcifications. Aortic annular calcifications. LUNGS, AIRWAYS, AND PLEURA: Subsegmental bibasilar atelectasis. Bibasilar predominant groundglass opacities and interlobular septal thickening. Scattered impacted airways in the bilateral lower lobes. Mild diffuse mosaic attenuation, consistent with patient's known history of reactive airways disease. Subsegmental atelectasis in lingula. Central airways are patent. Small right greater than left pleural effusions. MEDIASTINUM AND LYMPH NODES: Multiple prominent subcentimeter right hilar and mediastinal lymph nodes, likely related to congestion. Patulous esophagus with small amount of debris. CHEST WALL AND BONES: Unremarkable. UPPER ABDOMEN: Reflux of contrast within the hepatic veins.   - No  pulmonary embolism. - Findings consistent with pulmonary edema and small bilateral pleural effusions (right greater than left).. - Right atrial and right ventricular  dilation with reflux of contrast into the hepatic veins, suggestive of right heart dysfunction. Left atrial dilatation with mitral annular calcifications.   ECG 12 Lead  Result Date: 01/15/2023 ATRIAL FIBRILLATION WITH RAPID VENTRICULAR RESPONSE WITH PREMATURE VENTRICULAR OR ABERRANTLY CONDUCTED BEATS NONSPECIFIC ST ABNORMALITY ABNORMAL ECG  XR Chest 2 views  Result Date: 01/15/2023 EXAM: XR CHEST 2 VIEWS ACCESSION: [id number] [facility] CLINICAL INDICATION: DYSPNEA  TECHNIQUE: AP and Lateral Chest Radiographs. COMPARISON: Radiograph 02/09/2007 FINDINGS: Mild bronchial wall and fissural thickening. No pneumothorax. Cardiac silhouette is enlarged. Ectatic tortuous thoracic aorta.   Cardiomegaly with mild interstitial pulmonary edema.   Most Recent Labs: Recent Labs    Units 01/15/23 0643 01/16/23 0657 01/17/23 0721 01/18/23 0708 01/19/23 0701  WBC 10*9/L 6.8   < > 6.9 6.8 7.6  HGB g/dL 86.0   < > 86.7 86.6 86.0  HCT % 41.5   < > 39.4 39.5 41.6  MCV fL 105.4*   < > 106.9* 107.4* 106.9*  PLT 10*9/L 190   < > 179 182 192  DDIMER ng/mL FEU 1,495*  --   --   --   --    < > = values in this interval not displayed.   Recent Labs    Units 01/15/23 0643 01/16/23 0657 01/17/23 0721 01/18/23 0929 01/19/23 0701  NA mmol/L 142   < > 139 140 137  K mmol/L 3.8   < > 3.8 4.0 3.9  CL mmol/L 106   < > 104 101 102  CO2 mmol/L 24.0   < > 25.0 28.0 26.0  BUN mg/dL 13   < > 13 20 24*  CREATININE mg/dL 9.29   < > 9.29 9.09 9.29  GLU mg/dL 884*   < > 897 854* 89  CALCIUM  mg/dL 9.7   < > 9.4 9.4 9.2  MG mg/dL  --    < > 2.0 2.0 2.1  ALBUMIN g/dL 4.4  --   --   --   --   ALT U/L 25  --   --   --   --   AST U/L 45*  --   --   --   --   ALKPHOS U/L 97  --   --   --   --   BILITOT mg/dL 1.4*  --   --   --   --   PROT g/dL 7.7  --   --   --    --   A1C % 5.5  --   --   --   --   TSH uIU/mL 2.916  --   --   --   --    < > = values in this interval not displayed.   No results for input(s): LACTATE in the last 168 hours. Recent Labs    Units 01/15/23 0643  INR  1.11  PT sec 12.6   Recent Labs    Units 01/15/23 0643  TROPONINI ng/L 15  PROBNP pg/mL 4,396.0*   No results for input(s): O2SOUR, FIO2ART, PHART, PCO2ART, PO2ART, HCO3ART, O2SATART, BEART in the last 168 hours. No results for input(s): PHVEN, PCO2VEN, PO2VEN, HCO3VEN, O2SATVEN, BEVEN in the last 168 hours.  No results for input(s): WBCUA, NITRITE, LEUKOCYTESUR, BACTERIA, RBCUA, BLOODU, GLUCOSEU, PROTEINUA, KETONESU, KETUR in the last 72 hours. No results for input(s): PREGTESTUR in the last 72 hours. No results for input(s): OPIAU, BENZU, TRICYCLIC, PCPU, AMPHU, COCAU, CANNAU, BARBU, ETOH, ACETAMIN, SALICYLATE in the last 72 hours. Microbiology Results (last day)     **  No results found for the last 24 hours. **      ___________________________________________________________________ Discharge Instructions     Other Instructions     Discharge instructions     You were hospitalized for trouble breathing. This is because of your heart and lungs. We gave you medications to remove the fluids from your heart and lungs. We want you to continue this medication - Lasix  80mg  once daily in the morning.  We want you to follow-up with your cardiologist and primary care doctor within the next couple of weeks.  We also put in a referral to see the lung doctors about your lungs for a condition called pulmonary hypertension which we discussed.  If you have return of chest pain, shortness of breath, or other symptoms that concern you, please come back to hospital for evaluation   Discharge instructions to patient: Call your Specialist doctor and make an appointment to see them (specify):      Specialist: Cardiology Within 2 weeks from the time you are discharged from the hospital   Discharge instructions to patient: Call your primary care doctor and make an appointment to see them:     Within 2 weeks from the time you are discharged from the hospital      Follow Up instructions and Outpatient Referrals    Ambulatory Referral to Home Health     Reason for referral: Deconditioned   Physician to follow patient's care: PCP   Disciplines requested:  Physical Therapy Comment - evaluate the need for  a home health aide Occupational Therapy Nursing     Nursing requested: Teaching/skilled observation and assessment   What teaching is needed (new diagnosis? new medications?): assess/monitor  respiratory status.  Medication teaching / compliance   Physical Therapy requested:  Home safety evaluation Strengthening exercises Evaluate and treat     Occupational Therapy Requested:  Home safety evaluation Strengthening exercises Evaluate and treat     Ambulatory Referral to Pulmonology     Reason for referral: pulmonary hypertension   Requested follow up plan: You would evaluate and manage.   Ambulatory Referral to Pulmonology     Reason for referral: pHTN follow-up   Requested follow up plan: You would evaluate and manage.   Discharge instructions       ___________________________________________________________________

## 2023-01-22 DIAGNOSIS — Z8673 Personal history of transient ischemic attack (TIA), and cerebral infarction without residual deficits: Secondary | ICD-10-CM | POA: Diagnosis not present

## 2023-01-22 DIAGNOSIS — Z7901 Long term (current) use of anticoagulants: Secondary | ICD-10-CM | POA: Diagnosis not present

## 2023-01-22 DIAGNOSIS — Z9981 Dependence on supplemental oxygen: Secondary | ICD-10-CM | POA: Diagnosis not present

## 2023-01-22 DIAGNOSIS — I5030 Unspecified diastolic (congestive) heart failure: Secondary | ICD-10-CM | POA: Diagnosis not present

## 2023-01-22 DIAGNOSIS — Z7982 Long term (current) use of aspirin: Secondary | ICD-10-CM | POA: Diagnosis not present

## 2023-01-22 DIAGNOSIS — K118 Other diseases of salivary glands: Secondary | ICD-10-CM | POA: Diagnosis not present

## 2023-01-22 DIAGNOSIS — I4891 Unspecified atrial fibrillation: Secondary | ICD-10-CM | POA: Diagnosis not present

## 2023-01-22 DIAGNOSIS — I272 Pulmonary hypertension, unspecified: Secondary | ICD-10-CM | POA: Diagnosis not present

## 2023-01-22 DIAGNOSIS — I110 Hypertensive heart disease with heart failure: Secondary | ICD-10-CM | POA: Diagnosis not present

## 2023-01-22 DIAGNOSIS — Z955 Presence of coronary angioplasty implant and graft: Secondary | ICD-10-CM | POA: Diagnosis not present

## 2023-01-22 DIAGNOSIS — M25559 Pain in unspecified hip: Secondary | ICD-10-CM | POA: Diagnosis not present

## 2023-01-22 DIAGNOSIS — I2510 Atherosclerotic heart disease of native coronary artery without angina pectoris: Secondary | ICD-10-CM | POA: Diagnosis not present

## 2023-01-22 DIAGNOSIS — M549 Dorsalgia, unspecified: Secondary | ICD-10-CM | POA: Diagnosis not present

## 2023-01-23 DIAGNOSIS — I110 Hypertensive heart disease with heart failure: Secondary | ICD-10-CM | POA: Diagnosis not present

## 2023-01-23 DIAGNOSIS — I2510 Atherosclerotic heart disease of native coronary artery without angina pectoris: Secondary | ICD-10-CM | POA: Diagnosis not present

## 2023-01-23 DIAGNOSIS — I4891 Unspecified atrial fibrillation: Secondary | ICD-10-CM | POA: Diagnosis not present

## 2023-01-23 DIAGNOSIS — M549 Dorsalgia, unspecified: Secondary | ICD-10-CM | POA: Diagnosis not present

## 2023-01-23 DIAGNOSIS — I5030 Unspecified diastolic (congestive) heart failure: Secondary | ICD-10-CM | POA: Diagnosis not present

## 2023-01-23 DIAGNOSIS — I272 Pulmonary hypertension, unspecified: Secondary | ICD-10-CM | POA: Diagnosis not present

## 2023-01-25 DIAGNOSIS — I4891 Unspecified atrial fibrillation: Secondary | ICD-10-CM | POA: Diagnosis not present

## 2023-01-25 DIAGNOSIS — I130 Hypertensive heart and chronic kidney disease with heart failure and stage 1 through stage 4 chronic kidney disease, or unspecified chronic kidney disease: Secondary | ICD-10-CM | POA: Diagnosis not present

## 2023-01-25 DIAGNOSIS — I2510 Atherosclerotic heart disease of native coronary artery without angina pectoris: Secondary | ICD-10-CM | POA: Diagnosis not present

## 2023-01-25 DIAGNOSIS — E785 Hyperlipidemia, unspecified: Secondary | ICD-10-CM | POA: Diagnosis not present

## 2023-01-26 DIAGNOSIS — I272 Pulmonary hypertension, unspecified: Secondary | ICD-10-CM | POA: Diagnosis not present

## 2023-01-26 DIAGNOSIS — I5030 Unspecified diastolic (congestive) heart failure: Secondary | ICD-10-CM | POA: Diagnosis not present

## 2023-01-26 DIAGNOSIS — I110 Hypertensive heart disease with heart failure: Secondary | ICD-10-CM | POA: Diagnosis not present

## 2023-01-26 DIAGNOSIS — I4891 Unspecified atrial fibrillation: Secondary | ICD-10-CM | POA: Diagnosis not present

## 2023-01-26 DIAGNOSIS — M549 Dorsalgia, unspecified: Secondary | ICD-10-CM | POA: Diagnosis not present

## 2023-01-26 DIAGNOSIS — I2510 Atherosclerotic heart disease of native coronary artery without angina pectoris: Secondary | ICD-10-CM | POA: Diagnosis not present

## 2023-01-28 DIAGNOSIS — I509 Heart failure, unspecified: Secondary | ICD-10-CM | POA: Diagnosis not present

## 2023-01-29 DIAGNOSIS — N1831 Chronic kidney disease, stage 3a: Secondary | ICD-10-CM | POA: Diagnosis not present

## 2023-01-29 DIAGNOSIS — L508 Other urticaria: Secondary | ICD-10-CM | POA: Diagnosis not present

## 2023-01-29 DIAGNOSIS — I2510 Atherosclerotic heart disease of native coronary artery without angina pectoris: Secondary | ICD-10-CM | POA: Diagnosis not present

## 2023-01-29 DIAGNOSIS — J449 Chronic obstructive pulmonary disease, unspecified: Secondary | ICD-10-CM | POA: Diagnosis not present

## 2023-01-29 DIAGNOSIS — I10 Essential (primary) hypertension: Secondary | ICD-10-CM | POA: Diagnosis not present

## 2023-01-29 DIAGNOSIS — M810 Age-related osteoporosis without current pathological fracture: Secondary | ICD-10-CM | POA: Diagnosis not present

## 2023-01-29 DIAGNOSIS — R0609 Other forms of dyspnea: Secondary | ICD-10-CM | POA: Diagnosis not present

## 2023-01-29 DIAGNOSIS — E7800 Pure hypercholesterolemia, unspecified: Secondary | ICD-10-CM | POA: Diagnosis not present

## 2023-01-29 DIAGNOSIS — S32000A Wedge compression fracture of unspecified lumbar vertebra, initial encounter for closed fracture: Secondary | ICD-10-CM | POA: Diagnosis not present

## 2023-01-29 DIAGNOSIS — K219 Gastro-esophageal reflux disease without esophagitis: Secondary | ICD-10-CM | POA: Diagnosis not present

## 2023-01-29 DIAGNOSIS — I480 Paroxysmal atrial fibrillation: Secondary | ICD-10-CM | POA: Diagnosis not present

## 2023-01-29 DIAGNOSIS — J309 Allergic rhinitis, unspecified: Secondary | ICD-10-CM | POA: Diagnosis not present

## 2023-01-31 DIAGNOSIS — M549 Dorsalgia, unspecified: Secondary | ICD-10-CM | POA: Diagnosis not present

## 2023-01-31 DIAGNOSIS — I272 Pulmonary hypertension, unspecified: Secondary | ICD-10-CM | POA: Diagnosis not present

## 2023-01-31 DIAGNOSIS — I4891 Unspecified atrial fibrillation: Secondary | ICD-10-CM | POA: Diagnosis not present

## 2023-01-31 DIAGNOSIS — I2510 Atherosclerotic heart disease of native coronary artery without angina pectoris: Secondary | ICD-10-CM | POA: Diagnosis not present

## 2023-01-31 DIAGNOSIS — I5030 Unspecified diastolic (congestive) heart failure: Secondary | ICD-10-CM | POA: Diagnosis not present

## 2023-01-31 DIAGNOSIS — I110 Hypertensive heart disease with heart failure: Secondary | ICD-10-CM | POA: Diagnosis not present

## 2023-02-01 DIAGNOSIS — M549 Dorsalgia, unspecified: Secondary | ICD-10-CM | POA: Diagnosis not present

## 2023-02-01 DIAGNOSIS — I110 Hypertensive heart disease with heart failure: Secondary | ICD-10-CM | POA: Diagnosis not present

## 2023-02-01 DIAGNOSIS — I4891 Unspecified atrial fibrillation: Secondary | ICD-10-CM | POA: Diagnosis not present

## 2023-02-01 DIAGNOSIS — I5030 Unspecified diastolic (congestive) heart failure: Secondary | ICD-10-CM | POA: Diagnosis not present

## 2023-02-01 DIAGNOSIS — I2510 Atherosclerotic heart disease of native coronary artery without angina pectoris: Secondary | ICD-10-CM | POA: Diagnosis not present

## 2023-02-01 DIAGNOSIS — I272 Pulmonary hypertension, unspecified: Secondary | ICD-10-CM | POA: Diagnosis not present

## 2023-02-05 DIAGNOSIS — I272 Pulmonary hypertension, unspecified: Secondary | ICD-10-CM | POA: Diagnosis not present

## 2023-02-05 DIAGNOSIS — I4891 Unspecified atrial fibrillation: Secondary | ICD-10-CM | POA: Diagnosis not present

## 2023-02-05 DIAGNOSIS — I110 Hypertensive heart disease with heart failure: Secondary | ICD-10-CM | POA: Diagnosis not present

## 2023-02-05 DIAGNOSIS — I2510 Atherosclerotic heart disease of native coronary artery without angina pectoris: Secondary | ICD-10-CM | POA: Diagnosis not present

## 2023-02-05 DIAGNOSIS — I5030 Unspecified diastolic (congestive) heart failure: Secondary | ICD-10-CM | POA: Diagnosis not present

## 2023-02-05 DIAGNOSIS — M549 Dorsalgia, unspecified: Secondary | ICD-10-CM | POA: Diagnosis not present

## 2023-02-09 DIAGNOSIS — I5030 Unspecified diastolic (congestive) heart failure: Secondary | ICD-10-CM | POA: Diagnosis not present

## 2023-02-09 DIAGNOSIS — I4891 Unspecified atrial fibrillation: Secondary | ICD-10-CM | POA: Diagnosis not present

## 2023-02-09 DIAGNOSIS — I2510 Atherosclerotic heart disease of native coronary artery without angina pectoris: Secondary | ICD-10-CM | POA: Diagnosis not present

## 2023-02-09 DIAGNOSIS — I272 Pulmonary hypertension, unspecified: Secondary | ICD-10-CM | POA: Diagnosis not present

## 2023-02-09 DIAGNOSIS — M549 Dorsalgia, unspecified: Secondary | ICD-10-CM | POA: Diagnosis not present

## 2023-02-09 DIAGNOSIS — I110 Hypertensive heart disease with heart failure: Secondary | ICD-10-CM | POA: Diagnosis not present

## 2023-02-13 DIAGNOSIS — I130 Hypertensive heart and chronic kidney disease with heart failure and stage 1 through stage 4 chronic kidney disease, or unspecified chronic kidney disease: Secondary | ICD-10-CM | POA: Diagnosis not present

## 2023-02-13 DIAGNOSIS — I4891 Unspecified atrial fibrillation: Secondary | ICD-10-CM | POA: Diagnosis not present

## 2023-02-13 DIAGNOSIS — K921 Melena: Secondary | ICD-10-CM | POA: Diagnosis not present

## 2023-02-13 DIAGNOSIS — I2510 Atherosclerotic heart disease of native coronary artery without angina pectoris: Secondary | ICD-10-CM | POA: Diagnosis not present

## 2023-02-13 DIAGNOSIS — E785 Hyperlipidemia, unspecified: Secondary | ICD-10-CM | POA: Diagnosis not present

## 2023-02-14 DIAGNOSIS — I110 Hypertensive heart disease with heart failure: Secondary | ICD-10-CM | POA: Diagnosis not present

## 2023-02-14 DIAGNOSIS — I4891 Unspecified atrial fibrillation: Secondary | ICD-10-CM | POA: Diagnosis not present

## 2023-02-14 DIAGNOSIS — I5030 Unspecified diastolic (congestive) heart failure: Secondary | ICD-10-CM | POA: Diagnosis not present

## 2023-02-14 DIAGNOSIS — M549 Dorsalgia, unspecified: Secondary | ICD-10-CM | POA: Diagnosis not present

## 2023-02-14 DIAGNOSIS — I2510 Atherosclerotic heart disease of native coronary artery without angina pectoris: Secondary | ICD-10-CM | POA: Diagnosis not present

## 2023-02-14 DIAGNOSIS — I272 Pulmonary hypertension, unspecified: Secondary | ICD-10-CM | POA: Diagnosis not present

## 2023-02-19 DIAGNOSIS — I4891 Unspecified atrial fibrillation: Secondary | ICD-10-CM | POA: Diagnosis not present

## 2023-02-19 DIAGNOSIS — I2510 Atherosclerotic heart disease of native coronary artery without angina pectoris: Secondary | ICD-10-CM | POA: Diagnosis not present

## 2023-02-19 DIAGNOSIS — I5030 Unspecified diastolic (congestive) heart failure: Secondary | ICD-10-CM | POA: Diagnosis not present

## 2023-02-19 DIAGNOSIS — I110 Hypertensive heart disease with heart failure: Secondary | ICD-10-CM | POA: Diagnosis not present

## 2023-02-19 DIAGNOSIS — I272 Pulmonary hypertension, unspecified: Secondary | ICD-10-CM | POA: Diagnosis not present

## 2023-02-19 DIAGNOSIS — M549 Dorsalgia, unspecified: Secondary | ICD-10-CM | POA: Diagnosis not present

## 2023-02-21 DIAGNOSIS — Z8673 Personal history of transient ischemic attack (TIA), and cerebral infarction without residual deficits: Secondary | ICD-10-CM | POA: Diagnosis not present

## 2023-02-21 DIAGNOSIS — I5030 Unspecified diastolic (congestive) heart failure: Secondary | ICD-10-CM | POA: Diagnosis not present

## 2023-02-21 DIAGNOSIS — Z9981 Dependence on supplemental oxygen: Secondary | ICD-10-CM | POA: Diagnosis not present

## 2023-02-21 DIAGNOSIS — Z955 Presence of coronary angioplasty implant and graft: Secondary | ICD-10-CM | POA: Diagnosis not present

## 2023-02-21 DIAGNOSIS — Z7982 Long term (current) use of aspirin: Secondary | ICD-10-CM | POA: Diagnosis not present

## 2023-02-21 DIAGNOSIS — K118 Other diseases of salivary glands: Secondary | ICD-10-CM | POA: Diagnosis not present

## 2023-02-21 DIAGNOSIS — M25559 Pain in unspecified hip: Secondary | ICD-10-CM | POA: Diagnosis not present

## 2023-02-21 DIAGNOSIS — I110 Hypertensive heart disease with heart failure: Secondary | ICD-10-CM | POA: Diagnosis not present

## 2023-02-21 DIAGNOSIS — M549 Dorsalgia, unspecified: Secondary | ICD-10-CM | POA: Diagnosis not present

## 2023-02-21 DIAGNOSIS — I4891 Unspecified atrial fibrillation: Secondary | ICD-10-CM | POA: Diagnosis not present

## 2023-02-21 DIAGNOSIS — I2510 Atherosclerotic heart disease of native coronary artery without angina pectoris: Secondary | ICD-10-CM | POA: Diagnosis not present

## 2023-02-21 DIAGNOSIS — I272 Pulmonary hypertension, unspecified: Secondary | ICD-10-CM | POA: Diagnosis not present

## 2023-02-21 DIAGNOSIS — Z7901 Long term (current) use of anticoagulants: Secondary | ICD-10-CM | POA: Diagnosis not present

## 2023-02-28 DIAGNOSIS — I4891 Unspecified atrial fibrillation: Secondary | ICD-10-CM | POA: Diagnosis not present

## 2023-02-28 DIAGNOSIS — M549 Dorsalgia, unspecified: Secondary | ICD-10-CM | POA: Diagnosis not present

## 2023-02-28 DIAGNOSIS — I2510 Atherosclerotic heart disease of native coronary artery without angina pectoris: Secondary | ICD-10-CM | POA: Diagnosis not present

## 2023-02-28 DIAGNOSIS — I272 Pulmonary hypertension, unspecified: Secondary | ICD-10-CM | POA: Diagnosis not present

## 2023-02-28 DIAGNOSIS — I5030 Unspecified diastolic (congestive) heart failure: Secondary | ICD-10-CM | POA: Diagnosis not present

## 2023-02-28 DIAGNOSIS — I110 Hypertensive heart disease with heart failure: Secondary | ICD-10-CM | POA: Diagnosis not present

## 2023-03-01 DIAGNOSIS — H2513 Age-related nuclear cataract, bilateral: Secondary | ICD-10-CM | POA: Diagnosis not present

## 2023-03-05 DIAGNOSIS — I2510 Atherosclerotic heart disease of native coronary artery without angina pectoris: Secondary | ICD-10-CM | POA: Diagnosis not present

## 2023-03-05 DIAGNOSIS — I110 Hypertensive heart disease with heart failure: Secondary | ICD-10-CM | POA: Diagnosis not present

## 2023-03-05 DIAGNOSIS — I4891 Unspecified atrial fibrillation: Secondary | ICD-10-CM | POA: Diagnosis not present

## 2023-03-05 DIAGNOSIS — I5030 Unspecified diastolic (congestive) heart failure: Secondary | ICD-10-CM | POA: Diagnosis not present

## 2023-03-05 DIAGNOSIS — M549 Dorsalgia, unspecified: Secondary | ICD-10-CM | POA: Diagnosis not present

## 2023-03-05 DIAGNOSIS — I272 Pulmonary hypertension, unspecified: Secondary | ICD-10-CM | POA: Diagnosis not present

## 2023-03-19 DIAGNOSIS — I4891 Unspecified atrial fibrillation: Secondary | ICD-10-CM | POA: Diagnosis not present

## 2023-03-19 DIAGNOSIS — M549 Dorsalgia, unspecified: Secondary | ICD-10-CM | POA: Diagnosis not present

## 2023-03-19 DIAGNOSIS — I110 Hypertensive heart disease with heart failure: Secondary | ICD-10-CM | POA: Diagnosis not present

## 2023-03-19 DIAGNOSIS — I2510 Atherosclerotic heart disease of native coronary artery without angina pectoris: Secondary | ICD-10-CM | POA: Diagnosis not present

## 2023-03-19 DIAGNOSIS — I272 Pulmonary hypertension, unspecified: Secondary | ICD-10-CM | POA: Diagnosis not present

## 2023-03-19 DIAGNOSIS — I5030 Unspecified diastolic (congestive) heart failure: Secondary | ICD-10-CM | POA: Diagnosis not present

## 2024-01-09 ENCOUNTER — Emergency Department: Payer: Medicare (Managed Care)

## 2024-01-09 ENCOUNTER — Inpatient Hospital Stay
Admission: EM | Admit: 2024-01-09 | Discharge: 2024-01-18 | DRG: 291 | Disposition: A | Discharge status: Skilled Nursing Facility | Payer: Medicare (Managed Care) | Attending: Internal Medicine | Admitting: Internal Medicine

## 2024-01-09 ENCOUNTER — Other Ambulatory Visit: Payer: Self-pay

## 2024-01-09 ENCOUNTER — Encounter: Payer: Self-pay | Admitting: Intensive Care

## 2024-01-09 DIAGNOSIS — I5023 Acute on chronic systolic (congestive) heart failure: Secondary | ICD-10-CM

## 2024-01-09 DIAGNOSIS — N1831 Chronic kidney disease, stage 3a: Secondary | ICD-10-CM | POA: Diagnosis present

## 2024-01-09 DIAGNOSIS — M549 Dorsalgia, unspecified: Secondary | ICD-10-CM

## 2024-01-09 DIAGNOSIS — Z79899 Other long term (current) drug therapy: Secondary | ICD-10-CM

## 2024-01-09 DIAGNOSIS — Z1152 Encounter for screening for COVID-19: Secondary | ICD-10-CM

## 2024-01-09 DIAGNOSIS — R262 Difficulty in walking, not elsewhere classified: Secondary | ICD-10-CM

## 2024-01-09 DIAGNOSIS — Z87310 Personal history of (healed) osteoporosis fracture: Secondary | ICD-10-CM

## 2024-01-09 DIAGNOSIS — Z8673 Personal history of transient ischemic attack (TIA), and cerebral infarction without residual deficits: Secondary | ICD-10-CM

## 2024-01-09 DIAGNOSIS — S22000A Wedge compression fracture of unspecified thoracic vertebra, initial encounter for closed fracture: Secondary | ICD-10-CM

## 2024-01-09 DIAGNOSIS — J9601 Acute respiratory failure with hypoxia: Secondary | ICD-10-CM | POA: Diagnosis present

## 2024-01-09 DIAGNOSIS — G8929 Other chronic pain: Secondary | ICD-10-CM | POA: Diagnosis present

## 2024-01-09 DIAGNOSIS — J189 Pneumonia, unspecified organism: Secondary | ICD-10-CM

## 2024-01-09 DIAGNOSIS — J440 Chronic obstructive pulmonary disease with acute lower respiratory infection: Secondary | ICD-10-CM | POA: Diagnosis present

## 2024-01-09 DIAGNOSIS — I110 Hypertensive heart disease with heart failure: Principal | ICD-10-CM | POA: Diagnosis present

## 2024-01-09 DIAGNOSIS — E66813 Obesity, class 3: Secondary | ICD-10-CM | POA: Diagnosis present

## 2024-01-09 DIAGNOSIS — M4854XA Collapsed vertebra, not elsewhere classified, thoracic region, initial encounter for fracture: Secondary | ICD-10-CM

## 2024-01-09 DIAGNOSIS — I272 Pulmonary hypertension, unspecified: Secondary | ICD-10-CM

## 2024-01-09 DIAGNOSIS — Z66 Do not resuscitate: Secondary | ICD-10-CM | POA: Diagnosis present

## 2024-01-09 DIAGNOSIS — Z7982 Long term (current) use of aspirin: Secondary | ICD-10-CM

## 2024-01-09 DIAGNOSIS — G4700 Insomnia, unspecified: Secondary | ICD-10-CM | POA: Diagnosis present

## 2024-01-09 DIAGNOSIS — Z7901 Long term (current) use of anticoagulants: Secondary | ICD-10-CM

## 2024-01-09 DIAGNOSIS — E7800 Pure hypercholesterolemia, unspecified: Secondary | ICD-10-CM | POA: Diagnosis present

## 2024-01-09 DIAGNOSIS — K760 Fatty (change of) liver, not elsewhere classified: Secondary | ICD-10-CM | POA: Diagnosis present

## 2024-01-09 DIAGNOSIS — I10 Essential (primary) hypertension: Secondary | ICD-10-CM | POA: Diagnosis present

## 2024-01-09 DIAGNOSIS — Z955 Presence of coronary angioplasty implant and graft: Secondary | ICD-10-CM

## 2024-01-09 DIAGNOSIS — I480 Paroxysmal atrial fibrillation: Secondary | ICD-10-CM | POA: Diagnosis present

## 2024-01-09 DIAGNOSIS — K219 Gastro-esophageal reflux disease without esophagitis: Secondary | ICD-10-CM | POA: Diagnosis present

## 2024-01-09 DIAGNOSIS — I2510 Atherosclerotic heart disease of native coronary artery without angina pectoris: Secondary | ICD-10-CM | POA: Diagnosis present

## 2024-01-09 DIAGNOSIS — Z720 Tobacco use: Secondary | ICD-10-CM

## 2024-01-09 DIAGNOSIS — Z6837 Body mass index (BMI) 37.0-37.9, adult: Secondary | ICD-10-CM

## 2024-01-09 DIAGNOSIS — J449 Chronic obstructive pulmonary disease, unspecified: Secondary | ICD-10-CM

## 2024-01-09 DIAGNOSIS — M109 Gout, unspecified: Secondary | ICD-10-CM | POA: Diagnosis present

## 2024-01-09 DIAGNOSIS — M8008XA Age-related osteoporosis with current pathological fracture, vertebra(e), initial encounter for fracture: Secondary | ICD-10-CM | POA: Diagnosis present

## 2024-01-09 DIAGNOSIS — I5021 Acute systolic (congestive) heart failure: Secondary | ICD-10-CM | POA: Diagnosis present

## 2024-01-09 DIAGNOSIS — J209 Acute bronchitis, unspecified: Secondary | ICD-10-CM | POA: Diagnosis present

## 2024-01-09 DIAGNOSIS — Z8249 Family history of ischemic heart disease and other diseases of the circulatory system: Secondary | ICD-10-CM

## 2024-01-09 DIAGNOSIS — I509 Heart failure, unspecified: Secondary | ICD-10-CM

## 2024-01-09 HISTORY — DX: Personal history of (healed) osteoporosis fracture: Z87.310

## 2024-01-09 HISTORY — DX: Collapsed vertebra, not elsewhere classified, thoracic region, initial encounter for fracture: M48.54XA

## 2024-01-09 HISTORY — DX: Personal history of transient ischemic attack (TIA), and cerebral infarction without residual deficits: Z86.73

## 2024-01-09 HISTORY — DX: Pulmonary hypertension, unspecified: I27.20

## 2024-01-09 HISTORY — DX: Dorsalgia, unspecified: M54.9

## 2024-01-09 HISTORY — DX: Pneumonia, unspecified organism: J18.9

## 2024-01-09 HISTORY — DX: Acute on chronic systolic (congestive) heart failure: I50.23

## 2024-01-09 HISTORY — DX: Obesity, class 3: E66.813

## 2024-01-09 HISTORY — DX: Difficulty in walking, not elsewhere classified: R26.2

## 2024-01-09 HISTORY — DX: Acute respiratory failure with hypoxia: J96.01

## 2024-01-09 LAB — CBC WITH DIFFERENTIAL/PLATELET
Abs Immature Granulocytes: 0.07 K/uL (ref 0.00–0.07)
Basophils Absolute: 0 K/uL (ref 0.0–0.1)
Basophils Relative: 0 %
Eosinophils Absolute: 0.1 K/uL (ref 0.0–0.5)
Eosinophils Relative: 1 %
HCT: 42.6 % (ref 36.0–46.0)
Hemoglobin: 14.5 g/dL (ref 12.0–15.0)
Immature Granulocytes: 1 %
Lymphocytes Relative: 9 %
Lymphs Abs: 1.2 K/uL (ref 0.7–4.0)
MCH: 35.3 pg — ABNORMAL HIGH (ref 26.0–34.0)
MCHC: 34 g/dL (ref 30.0–36.0)
MCV: 103.6 fL — ABNORMAL HIGH (ref 80.0–100.0)
Monocytes Absolute: 1.1 K/uL — ABNORMAL HIGH (ref 0.1–1.0)
Monocytes Relative: 9 %
Neutro Abs: 10.1 K/uL — ABNORMAL HIGH (ref 1.7–7.7)
Neutrophils Relative %: 80 %
Platelets: 202 K/uL (ref 150–400)
RBC: 4.11 MIL/uL (ref 3.87–5.11)
RDW: 12.7 % (ref 11.5–15.5)
WBC: 12.5 K/uL — ABNORMAL HIGH (ref 4.0–10.5)
nRBC: 0 % (ref 0.0–0.2)

## 2024-01-09 LAB — COMPREHENSIVE METABOLIC PANEL WITH GFR
ALT: 41 U/L (ref 0–44)
AST: 30 U/L (ref 15–41)
Albumin: 3.1 g/dL — ABNORMAL LOW (ref 3.5–5.0)
Alkaline Phosphatase: 74 U/L (ref 38–126)
Anion gap: 11 (ref 5–15)
BUN: 22 mg/dL (ref 8–23)
CO2: 23 mmol/L (ref 22–32)
Calcium: 8.4 mg/dL — ABNORMAL LOW (ref 8.9–10.3)
Chloride: 107 mmol/L (ref 98–111)
Creatinine, Ser: 0.83 mg/dL (ref 0.44–1.00)
GFR, Estimated: >60 mL/min (ref >60)
Glucose, Bld: 105 mg/dL — ABNORMAL HIGH (ref 70–99)
Potassium: 3.5 mmol/L (ref 3.5–5.1)
Sodium: 141 mmol/L (ref 135–145)
Total Bilirubin: 1.5 mg/dL — ABNORMAL HIGH (ref 0.0–1.2)
Total Protein: 6.5 g/dL (ref 6.5–8.1)

## 2024-01-09 LAB — BRAIN NATRIURETIC PEPTIDE: B Natriuretic Peptide: 954.9 pg/mL — ABNORMAL HIGH (ref 0.0–100.0)

## 2024-01-09 LAB — URINALYSIS, ROUTINE W REFLEX MICROSCOPIC
Bilirubin Urine: NEGATIVE
Glucose, UA: NEGATIVE mg/dL
Hgb urine dipstick: NEGATIVE
Ketones, ur: NEGATIVE mg/dL
Leukocytes,Ua: NEGATIVE
Nitrite: NEGATIVE
Protein, ur: NEGATIVE mg/dL
Specific Gravity, Urine: 1.013 (ref 1.005–1.030)
pH: 6 (ref 5.0–8.0)

## 2024-01-09 LAB — LACTIC ACID, PLASMA
Lactic Acid, Venous: 1.2 mmol/L (ref 0.5–1.9)
Lactic Acid, Venous: 2 mmol/L (ref 0.5–1.9)

## 2024-01-09 MED ORDER — IPRATROPIUM-ALBUTEROL 0.5-2.5 (3) MG/3ML IN SOLN
3.0000 mL | Freq: Four times a day (QID) | RESPIRATORY_TRACT | Status: DC
Start: 2024-01-10 — End: 2024-01-10
  Administered 2024-01-10: 3 mL via RESPIRATORY_TRACT
  Filled 2024-01-09: qty 3

## 2024-01-09 MED ORDER — METHYLPREDNISOLONE SODIUM SUCC 40 MG IJ SOLR
40.0000 mg | Freq: Two times a day (BID) | INTRAMUSCULAR | Status: DC
  Administered 2024-01-09: 40 mg via INTRAVENOUS
  Filled 2024-01-09: qty 1

## 2024-01-09 MED ORDER — DILTIAZEM HCL ER COATED BEADS 180 MG PO CP24
180.0000 mg | ORAL_CAPSULE | Freq: Every day | ORAL | Status: DC
  Administered 2024-01-09 – 2024-01-18 (×10): 180 mg via ORAL
  Filled 2024-01-09 (×10): qty 1

## 2024-01-09 MED ORDER — HYDROCODONE-ACETAMINOPHEN 5-325 MG PO TABS
1.0000 | ORAL_TABLET | Freq: Once | ORAL | Status: AC
  Administered 2024-01-09: 1 via ORAL
  Filled 2024-01-09: qty 1

## 2024-01-09 MED ORDER — METHOCARBAMOL 1000 MG/10ML IJ SOLN
500.0000 mg | Freq: Four times a day (QID) | INTRAMUSCULAR | Status: DC | PRN
  Administered 2024-01-09: 500 mg via INTRAVENOUS
  Filled 2024-01-09: qty 10

## 2024-01-09 MED ORDER — ORAL CARE MOUTH RINSE
15.0000 mL | OROMUCOSAL | Status: DC | PRN
Start: 2024-01-09 — End: 2024-01-18

## 2024-01-09 MED ORDER — OXYCODONE HCL 5 MG PO TABS
5.0000 mg | ORAL_TABLET | ORAL | Status: DC | PRN
  Administered 2024-01-10 – 2024-01-18 (×15): 5 mg via ORAL
  Filled 2024-01-09 (×15): qty 1

## 2024-01-09 MED ORDER — PREDNISONE 20 MG PO TABS: 40.0000 mg | ORAL_TABLET | Freq: Every day | ORAL | Status: DC

## 2024-01-09 MED ORDER — LOSARTAN POTASSIUM 50 MG PO TABS
50.0000 mg | ORAL_TABLET | Freq: Every day | ORAL | Status: DC
  Administered 2024-01-10: 50 mg via ORAL
  Filled 2024-01-09: qty 1

## 2024-01-09 MED ORDER — ALBUTEROL SULFATE (2.5 MG/3ML) 0.083% IN NEBU: 2.5000 mg | INHALATION_SOLUTION | RESPIRATORY_TRACT | Status: DC | PRN

## 2024-01-09 MED ORDER — ONDANSETRON HCL 4 MG/2ML IJ SOLN: 4.0000 mg | Freq: Four times a day (QID) | INTRAMUSCULAR | Status: DC | PRN

## 2024-01-09 MED ORDER — GUAIFENESIN ER 600 MG PO TB12
600.0000 mg | ORAL_TABLET | Freq: Two times a day (BID) | ORAL | Status: DC
  Administered 2024-01-09 – 2024-01-18 (×18): 600 mg via ORAL
  Filled 2024-01-09 (×18): qty 1

## 2024-01-09 MED ORDER — ONDANSETRON HCL 4 MG PO TABS: 4.0000 mg | ORAL_TABLET | Freq: Four times a day (QID) | ORAL | Status: DC | PRN

## 2024-01-09 MED ORDER — FUROSEMIDE 10 MG/ML IJ SOLN
80.0000 mg | Freq: Once | INTRAMUSCULAR | Status: AC
  Administered 2024-01-09: 80 mg via INTRAVENOUS
  Filled 2024-01-09: qty 8

## 2024-01-09 MED ORDER — FUROSEMIDE 10 MG/ML IJ SOLN
40.0000 mg | Freq: Two times a day (BID) | INTRAMUSCULAR | Status: DC
  Administered 2024-01-10 – 2024-01-11 (×3): 40 mg via INTRAVENOUS
  Filled 2024-01-09 (×3): qty 4

## 2024-01-09 MED ORDER — APIXABAN 5 MG PO TABS
5.0000 mg | ORAL_TABLET | Freq: Two times a day (BID) | ORAL | Status: DC
  Administered 2024-01-09 – 2024-01-18 (×18): 5 mg via ORAL
  Filled 2024-01-09 (×18): qty 1

## 2024-01-09 MED ORDER — ACETAMINOPHEN 325 MG PO TABS
650.0000 mg | ORAL_TABLET | Freq: Four times a day (QID) | ORAL | Status: DC | PRN
  Administered 2024-01-09 – 2024-01-15 (×4): 650 mg via ORAL
  Filled 2024-01-09 (×3): qty 2

## 2024-01-09 MED ORDER — ACETAMINOPHEN 650 MG RE SUPP: 650.0000 mg | Freq: Four times a day (QID) | RECTAL | Status: DC | PRN

## 2024-01-09 MED ORDER — SODIUM CHLORIDE 0.9 % IV SOLN
2.0000 g | Freq: Once | INTRAVENOUS | Status: AC
  Administered 2024-01-09: 2 g via INTRAVENOUS
  Filled 2024-01-09: qty 20

## 2024-01-09 MED ORDER — MORPHINE SULFATE (PF) 2 MG/ML IV SOLN: 2.0000 mg | INTRAVENOUS | Status: DC | PRN

## 2024-01-09 MED ORDER — ONDANSETRON 4 MG PO TBDP
4.0000 mg | ORAL_TABLET | Freq: Once | ORAL | Status: AC
  Administered 2024-01-09: 4 mg via ORAL
  Filled 2024-01-09: qty 1

## 2024-01-09 NOTE — Assessment & Plan Note (Signed)
-

## 2024-01-09 NOTE — Assessment & Plan Note (Signed)
 History of prior osteoporotic compression fractures Intractable back pain with ambulatory dysfunction Neurosurgery consulted from the ED, avoid TLSO brace in the setting of possible pneumonia Multimodal pain control, NSAID, narcotic pain meds, lidocaine patch, gabapentin, muscle relaxants, heating pad Neurosurgery consult for in the a.m. for additional recommendations, with PT consulted after

## 2024-01-09 NOTE — ED Provider Notes (Signed)
 [facility] Emergency Department Provider Note     Event Date/Time   First MD Initiated Contact with Patient 01/09/24 1608     (approximate)   History   Back Pain   HPI  Morgan Jacobs is a 89 y.o. female with a history of TIA, HTN, A-fib on Eliquis , and CKD as well as chronic low back pain, presents to the ED endorsing acute on chronic low back pain.  Patient presents via EMS from home, reporting right-sided low back pain.  She endorses pain over the last few days, increased and worsened by standing.  Patient was recently started on a course of prednisone for her complaint of persistent cough since the early part of the month.  This is the patient's second course of antibiotics in the last 3 weeks for reported cough.  Daughter reports she has not been placed on any antibiotics for her cough but does not note the cough seems to be somewhat better.  Patient is also endorsing some generalized fatigue and decreased range of motion and mobility.  She was in a wheelchair for the better part of yesterday, as she finalized her's only son.  Patient denies any bladder or bowel incontinence, foot drop, saddle anesthesia.  She does endorse some pain from the right side and into the right upper thigh.    Physical Exam   Triage Vital Signs: ED Triage Vitals [01/09/24 1522]  Encounter Vitals Group     BP 110/72     Girls Systolic BP Percentile      Girls Diastolic BP Percentile      Boys Systolic BP Percentile      Boys Diastolic BP Percentile      Pulse Rate 87     Resp (!) 22     Temp (!) 97.4 F (36.3 C)     Temp Source Oral     SpO2 93 %     Weight 190 lb (86.2 kg)     Height 5' (1.524 m)     Head Circumference      Peak Flow      Pain Score 3     Pain Loc      Pain Education      Exclude from Growth Chart     Most recent vital signs: Vitals:   01/09/24 1612 01/09/24 1800  BP:  (!) 156/98  Pulse:  75  Resp:  (!) 22  Temp:    SpO2: 100% 98%     General Awake, no distress. NAD HEENT NCAT. PERRL. EOMI. No rhinorrhea. Mucous membranes are moist.  CV:  Good peripheral perfusion. No CCE distally RESP:  Normal effort.  ABD:  No distention. Soft, nontender MSK:  AROM of all extremities.  Tender to palpation to the midline thoracolumbar spine.  Patient positioned supine in the bed, but slumped over to the left which is her typical position of sitting and sleeping.   ED Results / Procedures / Treatments   Labs (all labs ordered are listed, but only abnormal results are displayed) Labs Reviewed  CBC WITH DIFFERENTIAL/PLATELET - Abnormal; Notable for the following components:      Result Value   WBC 12.5 (*)    MCV 103.6 (*)    MCH 35.3 (*)    Neutro Abs 10.1 (*)    Monocytes Absolute 1.1 (*)    All other components within normal limits  COMPREHENSIVE METABOLIC PANEL WITH GFR - Abnormal; Notable for the following components:   Glucose,  Bld 105 (*)    Calcium  8.4 (*)    Albumin 3.1 (*)    Total Bilirubin 1.5 (*)    All other components within normal limits  URINALYSIS, ROUTINE W REFLEX MICROSCOPIC - Abnormal; Notable for the following components:   Color, Urine YELLOW (*)    APPearance CLEAR (*)    All other components within normal limits  BRAIN NATRIURETIC PEPTIDE - Abnormal; Notable for the following components:   B Natriuretic Peptide 954.9 (*)    All other components within normal limits  CULTURE, BLOOD (SINGLE)  LIPASE, BLOOD  LACTIC ACID, PLASMA  LACTIC ACID, PLASMA    EKG   RADIOLOGY  I personally viewed and evaluated these images as part of my medical decision making, as well as reviewing the written report by the radiologist.  ED Provider Interpretation: New T11 compression fracture, progression of chronic L4 and L5 compression fractures, respectively.  Questionable RLL consolidation concerning for pneumonia.  Severe spinal stenosis and bilateral L3-4 neuroforaminal stenosis  CT Thoracic Spine Wo  Contrast Result Date: 01/09/2024 EXAM: CT THORACIC AND LUMBAR SPINE WITHOUT INTRAVENOUS CONTRAST 01/09/2024 06:10:18 PM TECHNIQUE: CT of the thoracic and lumbar spine was performed without the administration of intravenous contrast. Multiplanar reformatted images are provided for review. Automated exposure control, iterative reconstruction, and/or weight based adjustment of the mA/kV was utilized to reduce the radiation dose to as low as reasonably achievable. Incidental adrenal and/or renal findings do not require follow up imaging. COMPARISON: Lumbar spine radiographs 10/11/2022. CLINICAL HISTORY: Mid-back pain, prior compression fracture. Chronic low right sided back pain. Hx DJD, arthritis. C/O last couple of days increasing pain with standing. Recently started prednisone for back pain, not effective. FINDINGS: THORACIC SPINE: BONES AND ALIGNMENT: Suspected compression fracture of the superior endplate of T11 with an acute appearance. Less than 10% T11 vertebral body height loss. No retropulsion. Normal alignment. DEGENERATIVE CHANGES: Mild spondylosis. No severe thoracic spinal canal narrowing. SOFT TISSUES: Interlobular septal thickening in the lungs. Hazy airspace opacity in the right lower lobe. Small right greater than left pleural effusions. LUMBAR SPINE: BONES AND ALIGNMENT: Anterior compression fracture of L4 has increased from 10/11/2022. Additional compression fracture of L5 is new since 2024. 60% vertebral body height loss of L4 anteriorly. 25% vertebral body height loss of L5. 4 mm of retropulsion of the superior endplate of L4 and 4 mm of retropulsion of the superior endplate of L5. These are age indeterminate without recent comparison but favored chronic. Grade 1 retrolisthesis of L2. This is unchanged from prior. DEGENERATIVE CHANGES: Multilevel spondylosis, disc space height loss, vacuum phenomenon, and degenerative endplate changes. These are greatest at L2-L3 where they are moderate. Moderate  to advanced facet arthropathy in the lower lumbar spine. Spinal canal narrowing is greatest at L3-L4 where it is severe secondary to posterior disc bulge, retropulsion of the superior endplate of L4 (4 mm), ligamentum flavum hypertrophy, and facet arthropathy. The disc bulge at L3-L4 causes severe narrowing of the bilateral neural foramina. SOFT TISSUES: No acute abnormality. IMPRESSION: 1. Suspected acute compression fracture of the superior endplate of T11 with less than 10% vertebral body height loss. No retropulsion. 2. Presumed chronic compression fractures of L4 and L5 as described. 3. Severe spinal canal and bilateral neural foraminal narrowing at L3 - L4. 4. Hazy airspace opacity in the right lower lobe may be asymmetric edema or pneumonia. 5. Interstitial edema in the lungs with small bilateral pleural effusions. Electronically signed by: Norman Gatlin MD 01/09/2024 06:30 PM EDT RP  Workstation: [id number]   CT Lumbar Spine Wo Contrast Result Date: 01/09/2024 EXAM: CT THORACIC AND LUMBAR SPINE WITHOUT INTRAVENOUS CONTRAST 01/09/2024 06:10:18 PM TECHNIQUE: CT of the thoracic and lumbar spine was performed without the administration of intravenous contrast. Multiplanar reformatted images are provided for review. Automated exposure control, iterative reconstruction, and/or weight based adjustment of the mA/kV was utilized to reduce the radiation dose to as low as reasonably achievable. Incidental adrenal and/or renal findings do not require follow up imaging. COMPARISON: Lumbar spine radiographs 10/11/2022. CLINICAL HISTORY: Mid-back pain, prior compression fracture. Chronic low right sided back pain. Hx DJD, arthritis. C/O last couple of days increasing pain with standing. Recently started prednisone for back pain, not effective. FINDINGS: THORACIC SPINE: BONES AND ALIGNMENT: Suspected compression fracture of the superior endplate of T11 with an acute appearance. Less than 10% T11 vertebral body height  loss. No retropulsion. Normal alignment. DEGENERATIVE CHANGES: Mild spondylosis. No severe thoracic spinal canal narrowing. SOFT TISSUES: Interlobular septal thickening in the lungs. Hazy airspace opacity in the right lower lobe. Small right greater than left pleural effusions. LUMBAR SPINE: BONES AND ALIGNMENT: Anterior compression fracture of L4 has increased from 10/11/2022. Additional compression fracture of L5 is new since 2024. 60% vertebral body height loss of L4 anteriorly. 25% vertebral body height loss of L5. 4 mm of retropulsion of the superior endplate of L4 and 4 mm of retropulsion of the superior endplate of L5. These are age indeterminate without recent comparison but favored chronic. Grade 1 retrolisthesis of L2. This is unchanged from prior. DEGENERATIVE CHANGES: Multilevel spondylosis, disc space height loss, vacuum phenomenon, and degenerative endplate changes. These are greatest at L2-L3 where they are moderate. Moderate to advanced facet arthropathy in the lower lumbar spine. Spinal canal narrowing is greatest at L3-L4 where it is severe secondary to posterior disc bulge, retropulsion of the superior endplate of L4 (4 mm), ligamentum flavum hypertrophy, and facet arthropathy. The disc bulge at L3-L4 causes severe narrowing of the bilateral neural foramina. SOFT TISSUES: No acute abnormality. IMPRESSION: 1. Suspected acute compression fracture of the superior endplate of T11 with less than 10% vertebral body height loss. No retropulsion. 2. Presumed chronic compression fractures of L4 and L5 as described. 3. Severe spinal canal and bilateral neural foraminal narrowing at L3 - L4. 4. Hazy airspace opacity in the right lower lobe may be asymmetric edema or pneumonia. 5. Interstitial edema in the lungs with small bilateral pleural effusions. Electronically signed by: Norman Gatlin MD 01/09/2024 06:30 PM EDT RP Workstation: [id number]   DG Chest 1 View Result Date: 01/09/2024 EXAM: 1 VIEW(S)  XRAY OF THE CHEST 01/09/2024 04:16:00 PM COMPARISON: None available. CLINICAL HISTORY: Cough FINDINGS: LUNGS AND PLEURA: There is soft tissue attenuation artifact overlying the lower lung zones. Pulmonary vascular congestion. Small left pleural effusion suspected. Decreased aeration to the left lower lobe may reflect underlying atelectasis or airspace disease. Repeat imaging as the patient's clinical condition tolerates in the upright PA and lateral orientation advised. No pneumothorax. HEART AND MEDIASTINUM: Atherosclerotic plaque. Unchanged cardiac enlargement. BONES AND SOFT TISSUES: No acute osseous abnormality. IMPRESSION: 1. Diminished exam detail secondary to suboptimal patient positioning with soft tissue attenuation artifact overlying both lower lung zones. 2. Decreased aeration to the left lower lobe, possibly representing atelectasis or airspace disease. 3. Small left pleural effusion suspected. 4. Pulmonary vascular congestion. Electronically signed by: Waddell Calk MD 01/09/2024 04:29 PM EDT RP Workstation: [id number]    PROCEDURES:  Critical Care performed: No  Procedures   MEDICATIONS  ORDERED IN ED: Medications  cefTRIAXone (ROCEPHIN) 2 g in sodium chloride  0.9 % 100 mL IVPB (2 g Intravenous New Bag/Given 01/09/24 2023)  HYDROcodone-acetaminophen  (NORCO/VICODIN) 5-325 MG per tablet 1 tablet (1 tablet Oral Given 01/09/24 1641)  ondansetron (ZOFRAN-ODT) disintegrating tablet 4 mg (4 mg Oral Given 01/09/24 1641)  furosemide  (LASIX ) injection 80 mg (80 mg Intravenous Given 01/09/24 2019)     IMPRESSION / MDM / ASSESSMENT AND PLAN / ED COURSE  I reviewed the triage vital signs and the nursing notes.                              Differential diagnosis includes, but is not limited to, DJD, compression fracture, myalgias, lumbar radiculopathy  Patient's presentation is most consistent with acute complicated illness / injury requiring diagnostic  workup.  ----------------------------------------- 7:33 PM on 01/09/2024 ----------------------------------------- Sent secure chat to Dr. WENDI Sharps (Neuro) regarding the patient's CT findings and plan for admission.  Patient's diagnosis is consistent with acute mid back pain secondary to new T11 compression fracture.  Acute on chronic low back pain due to progression of L4/5 compression fractures.  These fracture morphologies are noted on CT images interpreted by me.  Chest x-ray shows some evidence of atelectasis bilaterally, but CT thoracic spine, shows some incidental concern for possible RLL consolidation.  Patient also found to have some new hypoxia while in the room.  RN noted sats in the high 80s, placed the patient on 2 L of O2 per nasal cannula.  This is likely due to her positioning as well as her acute thoracic pain.  Patient also found to have a slightly elevated white count 12.5, this may represent a subacute pneumonia as well as leukocytosis due to her recent current steroid course.  Patient with elevated BNP consistent with her poor medication compliance and again her complaints of cough, congestion, and mild hypoxia.  Patient did endorse significant movement of her pain after p.o. medication administration.  At the time of this interim update, patient is resting comfortably with her adult daughters at bedside.  We discussed the findings of her labs and her CTs.  Discussion was had about the patient's significant pain, decreased mobility, new O2 requirement, as well as what appears to be an RLL pneumonia.  These factors along with her acute pain secondary to his acute compression, led us  to have discussion about admission.  The daughters along with the patient were agreeable to the plan at this time.  Patient will be admitted to the hospital service for ongoing evaluation management of her acute fracture, CAP, CHF exacerbation, and acute hypoxia.     ----------------------------------------- 8:24 PM on 01/09/2024 -----------------------------------------  Patient will be admitted to the hospital service for evaluation and management of her acute T-spine fracture causing acute on chronic back pain, presumed RLL pneumonia, and CHF exacerbation with new O2 requirement.  Dr. Delayne Solian has been consulted, and will admit the patient as discussed.  Family is agreeable to the plan for admission.  Patient started empirically on Lasix  as well as Rocephin.   FINAL CLINICAL IMPRESSION(S) / ED DIAGNOSES   Final diagnoses:  Community acquired pneumonia of right lower lobe of lung  Thoracic compression fracture, closed, initial encounter (HCC)  Acute on chronic congestive heart failure, unspecified heart failure type (HCC)  Acute respiratory failure with hypoxia (HCC)     Rx / DC Orders   ED Discharge Orders     None  Note:  This document was prepared using Dragon voice recognition software and may include unintentional dictation errors.    Loyd Candida LULLA Aldona, PA-C 01/09/24 2034    Jacolyn Pae, MD 01/11/24 1322

## 2024-01-09 NOTE — Assessment & Plan Note (Addendum)
 Chest CT showing asymmetric edema versus pneumonia as well as interstitial edema BNP elevated in the 900s Received a dose of Lasix  in the ED Will continue with IV Lasix  Continue other GDMT including losartan and metoprolol  Daily weights with intake and output monitoring Will update echocardiogram, last EF on record was 45 to 50% 12/2022

## 2024-01-09 NOTE — Assessment & Plan Note (Signed)
 History of fatty liver disease LFTs currently normal with slightly elevated bilirubin

## 2024-01-09 NOTE — Assessment & Plan Note (Signed)
Continue Eliquis Not currently on statin

## 2024-01-09 NOTE — Assessment & Plan Note (Signed)
 Chronic anticoagulation No acute issues Continue Eliquis  and diltiazem  and metoprolol 

## 2024-01-09 NOTE — Assessment & Plan Note (Signed)
 No complaints of chest pain Continue current GDMT

## 2024-01-09 NOTE — H&P (Signed)
 History and Physical    Patient: Morgan Jacobs FMW:[id number] DOB: 03/10/1934 DOA: 01/09/2024 DOS: the patient was seen and examined on 01/09/2024 PCP: Montey Lot, PA-C  Patient coming from: Home  Chief Complaint:  Chief Complaint  Patient presents with   Back Pain    HPI: Morgan Jacobs is a 89 y.o. female with medical history significant for HFrEF (EF 45 to 50%, 12/2022), pulmonary hypertension, A-fib on Eliquis , HTN, COPD, CAD s/p proximal LAD stent (2003), NASH,  TIA(07/2021), parotid mass noted in 12/2022, chronic back pain and hip pain with history of osteoporotic vertebral fracture being admitted with intractable back pain secondary to an acute T1 compression fracture, as well as a right lower lobe pneumonia seen on CT with new O2 requirement of 2 L. Patient was initially brought in by EMS with a several day history of worsening of her chronic right-sided low back pain causing her to stay in bed which is not normal for her.  Her decreased mobility was initially attributed to her burying her son recently.  Additionally, patient has had a cough for the past month, seeing her PCP on 2 occasions for which she was started on prednisone without improvement.  She has had no chest pain, lower extremity pain or swelling. In the ED afebrile, tachypneic to 22, normal pulse and blood pressure, O2 sat 88 on room air requiring 2 L to get to the mid to high 90s. Labs notable for WBC 12.5 with lactic acid 1.2.  BNP 954 Total bilirubin 1.5 UA unremarkable Respiratory viral panel not done EKG not done CT lumbar and thoracic spine showing acute compression fracture superior endplate of T11 without retropulsion and less than 10% vertebral body height loss with abnormal chronic findings CT T-spine also showed airspace opacity right lower lobe possibly asymmetric edema versus pneumonia as well as interstitial edema in the lungs with small bilateral pleural effusions Patient treated with a dose of IV  Lasix  Started on Rocephin and azithromycin for community-acquired pneumonia The ED provider: Spoke with neurosurgeon, Dr. Penne Sharps who recommended no TLSO brace due to possible pneumonia, acute intervention but will see in the morning. Admission requested     Past Medical History:  Diagnosis Date   Asthma    Benign essential tremor    CAD (coronary artery disease)    COPD (chronic obstructive pulmonary disease) (HCC)    Diverticulosis    Fatty liver    GERD (gastroesophageal reflux disease)    Gout    HTN (hypertension)    Hypercholesterolemia    Insomnia    Kidney stone    Osteoporosis    Parotid mass    Paroxysmal atrial fibrillation (HCC)    On Eliquis    TIA (transient ischemic attack)    Past Surgical History:  Procedure Laterality Date   CATARACT EXTRACTION     heart stent     TONSILLECTOMY AND ADENOIDECTOMY     Social History:  reports that she quit smoking about 67 years ago. Her smoking use included cigarettes. Her smokeless tobacco use includes snuff. She reports that she does not currently use alcohol. She reports that she does not use drugs.  No Known Allergies  Family History  Problem Relation Age of Onset   Hypertension Mother    Heart failure Mother    Tremor Mother    Hypertension Father    Hypertension Sister    Cancer Sister        brain, bone, pancreatic   Tremor Sister  Hypertension Brother     Prior to Admission medications   Medication Sig Start Date End Date Taking? Authorizing Provider  furosemide  (LASIX ) 20 MG tablet Take 20 mg by mouth daily.   Yes [provider]  aspirin 81 MG chewable tablet Chew 81 mg by mouth daily.    [provider]  diltiazem  (DILACOR XR ) 180 MG 24 hr capsule Take 180 mg by mouth daily. 06/11/21   [provider]  ELIQUIS  5 MG TABS tablet Take 5 mg by mouth 2 (two) times daily. 06/06/21   [provider]  ezetimibe  (ZETIA ) 10 MG tablet Take 1 tablet (10 mg total) by  mouth daily. 08/08/21   Danford, Lonni SQUIBB, MD  loratadine (CLARITIN) 10 MG tablet Take 10 mg by mouth 2 (two) times daily. 06/14/21   [provider]  losartan-hydrochlorothiazide (HYZAAR) 50-12.5 MG tablet Take 1 tablet by mouth daily. 05/12/21   [provider]  metoprolol  succinate (TOPROL -XL) 50 MG 24 hr tablet Take 50 mg by mouth daily. 06/11/21   [provider]  omeprazole (PRILOSEC) 20 MG capsule Take 20 mg by mouth daily as needed (reflux). 03/16/21   [provider]    Physical Exam: Vitals:   01/09/24 1522 01/09/24 1612 01/09/24 1800 01/09/24 2042  BP: 110/72  (!) 156/98   Pulse: 87  75   Resp: (!) 22  (!) 22   Temp: (!) 97.4 F (36.3 C)   98.2 F (36.8 C)  TempSrc: Oral   Oral  SpO2: 93% 100% 98%   Weight: 86.2 kg     Height: 5' (1.524 m)      Physical Exam Vitals and nursing note reviewed.  Constitutional:      General: She is sleeping. She is not in acute distress. HENT:     Head: Normocephalic and atraumatic.  Cardiovascular:     Rate and Rhythm: Normal rate and regular rhythm.     Heart sounds: Normal heart sounds.  Pulmonary:     Effort: Tachypnea present.     Breath sounds: Normal breath sounds.  Abdominal:     Palpations: Abdomen is soft.     Tenderness: There is no abdominal tenderness.  Neurological:     Mental Status: Mental status is at baseline.     Labs on Admission: I have personally reviewed following labs and imaging studies  CBC: Recent Labs  Lab 01/09/24 1527  WBC 12.5*  NEUTROABS 10.1*  HGB 14.5  HCT 42.6  MCV 103.6*  PLT 202   Basic Metabolic Panel: Recent Labs  Lab 01/09/24 1527  NA 141  K 3.5  CL 107  CO2 23  GLUCOSE 105*  BUN 22  CREATININE 0.83  CALCIUM  8.4*   GFR: Estimated Creatinine Clearance: 45.7 mL/min (by C-G formula based on SCr of 0.83 mg/dL). Liver Function Tests: Recent Labs  Lab 01/09/24 1527  AST 30  ALT 41  ALKPHOS 74  BILITOT 1.5*  PROT 6.5  ALBUMIN  3.1*   No results for input(s): LIPASE, AMYLASE in the last 168 hours. No results for input(s): AMMONIA in the last 168 hours. Coagulation Profile: No results for input(s): INR, PROTIME in the last 168 hours. Cardiac Enzymes: No results for input(s): CKTOTAL, CKMB, CKMBINDEX, TROPONINI in the last 168 hours. BNP (last 3 results) No results for input(s): PROBNP in the last 8760 hours. HbA1C: No results for input(s): HGBA1C in the last 72 hours. CBG: No results for input(s): GLUCAP in the last 168 hours. Lipid Profile:  No results for input(s): CHOL, HDL, LDLCALC, TRIG, CHOLHDL, LDLDIRECT in the last 72 hours. Thyroid  Function Tests: No results for input(s): TSH, T4TOTAL, FREET4, T3FREE, THYROIDAB in the last 72 hours. Anemia Panel: No results for input(s): VITAMINB12, FOLATE, FERRITIN, TIBC, IRON, RETICCTPCT in the last 72 hours. Urine analysis:    Component Value Date/Time   COLORURINE YELLOW (A) 01/09/2024 1630   APPEARANCEUR CLEAR (A) 01/09/2024 1630   LABSPEC 1.013 01/09/2024 1630   PHURINE 6.0 01/09/2024 1630   GLUCOSEU NEGATIVE 01/09/2024 1630   HGBUR NEGATIVE 01/09/2024 1630   BILIRUBINUR NEGATIVE 01/09/2024 1630   KETONESUR NEGATIVE 01/09/2024 1630   PROTEINUR NEGATIVE 01/09/2024 1630   NITRITE NEGATIVE 01/09/2024 1630   LEUKOCYTESUR NEGATIVE 01/09/2024 1630    Radiological Exams on Admission: CT Thoracic Spine Wo Contrast Result Date: 01/09/2024 EXAM: CT THORACIC AND LUMBAR SPINE WITHOUT INTRAVENOUS CONTRAST 01/09/2024 06:10:18 PM TECHNIQUE: CT of the thoracic and lumbar spine was performed without the administration of intravenous contrast. Multiplanar reformatted images are provided for review. Automated exposure control, iterative reconstruction, and/or weight based adjustment of the mA/kV was utilized to reduce the radiation dose to as low as reasonably achievable. Incidental adrenal and/or renal findings do  not require follow up imaging. COMPARISON: Lumbar spine radiographs 10/11/2022. CLINICAL HISTORY: Mid-back pain, prior compression fracture. Chronic low right sided back pain. Hx DJD, arthritis. C/O last couple of days increasing pain with standing. Recently started prednisone for back pain, not effective. FINDINGS: THORACIC SPINE: BONES AND ALIGNMENT: Suspected compression fracture of the superior endplate of T11 with an acute appearance. Less than 10% T11 vertebral body height loss. No retropulsion. Normal alignment. DEGENERATIVE CHANGES: Mild spondylosis. No severe thoracic spinal canal narrowing. SOFT TISSUES: Interlobular septal thickening in the lungs. Hazy airspace opacity in the right lower lobe. Small right greater than left pleural effusions. LUMBAR SPINE: BONES AND ALIGNMENT: Anterior compression fracture of L4 has increased from 10/11/2022. Additional compression fracture of L5 is new since 2024. 60% vertebral body height loss of L4 anteriorly. 25% vertebral body height loss of L5. 4 mm of retropulsion of the superior endplate of L4 and 4 mm of retropulsion of the superior endplate of L5. These are age indeterminate without recent comparison but favored chronic. Grade 1 retrolisthesis of L2. This is unchanged from prior. DEGENERATIVE CHANGES: Multilevel spondylosis, disc space height loss, vacuum phenomenon, and degenerative endplate changes. These are greatest at L2-L3 where they are moderate. Moderate to advanced facet arthropathy in the lower lumbar spine. Spinal canal narrowing is greatest at L3-L4 where it is severe secondary to posterior disc bulge, retropulsion of the superior endplate of L4 (4 mm), ligamentum flavum hypertrophy, and facet arthropathy. The disc bulge at L3-L4 causes severe narrowing of the bilateral neural foramina. SOFT TISSUES: No acute abnormality. IMPRESSION: 1. Suspected acute compression fracture of the superior endplate of T11 with less than 10% vertebral body height loss.  No retropulsion. 2. Presumed chronic compression fractures of L4 and L5 as described. 3. Severe spinal canal and bilateral neural foraminal narrowing at L3 - L4. 4. Hazy airspace opacity in the right lower lobe may be asymmetric edema or pneumonia. 5. Interstitial edema in the lungs with small bilateral pleural effusions. Electronically signed by: Norman Gatlin MD 01/09/2024 06:30 PM EDT RP Workstation: [id number]   CT Lumbar Spine Wo Contrast Result Date: 01/09/2024 EXAM: CT THORACIC AND LUMBAR SPINE WITHOUT INTRAVENOUS CONTRAST 01/09/2024 06:10:18 PM TECHNIQUE: CT of the thoracic and lumbar spine was performed without the administration of intravenous contrast. Multiplanar  reformatted images are provided for review. Automated exposure control, iterative reconstruction, and/or weight based adjustment of the mA/kV was utilized to reduce the radiation dose to as low as reasonably achievable. Incidental adrenal and/or renal findings do not require follow up imaging. COMPARISON: Lumbar spine radiographs 10/11/2022. CLINICAL HISTORY: Mid-back pain, prior compression fracture. Chronic low right sided back pain. Hx DJD, arthritis. C/O last couple of days increasing pain with standing. Recently started prednisone for back pain, not effective. FINDINGS: THORACIC SPINE: BONES AND ALIGNMENT: Suspected compression fracture of the superior endplate of T11 with an acute appearance. Less than 10% T11 vertebral body height loss. No retropulsion. Normal alignment. DEGENERATIVE CHANGES: Mild spondylosis. No severe thoracic spinal canal narrowing. SOFT TISSUES: Interlobular septal thickening in the lungs. Hazy airspace opacity in the right lower lobe. Small right greater than left pleural effusions. LUMBAR SPINE: BONES AND ALIGNMENT: Anterior compression fracture of L4 has increased from 10/11/2022. Additional compression fracture of L5 is new since 2024. 60% vertebral body height loss of L4 anteriorly. 25% vertebral body height  loss of L5. 4 mm of retropulsion of the superior endplate of L4 and 4 mm of retropulsion of the superior endplate of L5. These are age indeterminate without recent comparison but favored chronic. Grade 1 retrolisthesis of L2. This is unchanged from prior. DEGENERATIVE CHANGES: Multilevel spondylosis, disc space height loss, vacuum phenomenon, and degenerative endplate changes. These are greatest at L2-L3 where they are moderate. Moderate to advanced facet arthropathy in the lower lumbar spine. Spinal canal narrowing is greatest at L3-L4 where it is severe secondary to posterior disc bulge, retropulsion of the superior endplate of L4 (4 mm), ligamentum flavum hypertrophy, and facet arthropathy. The disc bulge at L3-L4 causes severe narrowing of the bilateral neural foramina. SOFT TISSUES: No acute abnormality. IMPRESSION: 1. Suspected acute compression fracture of the superior endplate of T11 with less than 10% vertebral body height loss. No retropulsion. 2. Presumed chronic compression fractures of L4 and L5 as described. 3. Severe spinal canal and bilateral neural foraminal narrowing at L3 - L4. 4. Hazy airspace opacity in the right lower lobe may be asymmetric edema or pneumonia. 5. Interstitial edema in the lungs with small bilateral pleural effusions. Electronically signed by: Norman Gatlin MD 01/09/2024 06:30 PM EDT RP Workstation: [id number]   DG Chest 1 View Result Date: 01/09/2024 EXAM: 1 VIEW(S) XRAY OF THE CHEST 01/09/2024 04:16:00 PM COMPARISON: None available. CLINICAL HISTORY: Cough FINDINGS: LUNGS AND PLEURA: There is soft tissue attenuation artifact overlying the lower lung zones. Pulmonary vascular congestion. Small left pleural effusion suspected. Decreased aeration to the left lower lobe may reflect underlying atelectasis or airspace disease. Repeat imaging as the patient's clinical condition tolerates in the upright PA and lateral orientation advised. No pneumothorax. HEART AND MEDIASTINUM:  Atherosclerotic plaque. Unchanged cardiac enlargement. BONES AND SOFT TISSUES: No acute osseous abnormality. IMPRESSION: 1. Diminished exam detail secondary to suboptimal patient positioning with soft tissue attenuation artifact overlying both lower lung zones. 2. Decreased aeration to the left lower lobe, possibly representing atelectasis or airspace disease. 3. Small left pleural effusion suspected. 4. Pulmonary vascular congestion. Electronically signed by: Waddell Calk MD 01/09/2024 04:29 PM EDT RP Workstation: [id number]   Data Reviewed for HPI: Relevant notes from primary care and specialist visits, past discharge summaries as available in EHR, including Care Everywhere. Prior diagnostic testing as pertinent to current admission diagnoses Updated medications and problem lists for reconciliation ED course, including vitals, labs, imaging, treatment and response to treatment Triage notes, nursing and  pharmacy notes and ED provider's notes Notable results as noted above in HPI      Assessment and Plan: * Acute respiratory failure with hypoxia (HCC) Possible CAP/acute bronchitis/HFrEF exacerbation  History of moderate pulmonary hypertension Likely multifactorial, CAP/CHF, severe PD with acute bronchitis, currently on prednisone O2 sats 88% with tachypnea Supplemental oxygen to keep sats over 92% and wean as tolerated Will treat each etiology as described on the respective problems  Acute on chronic HFrEF (heart failure with reduced ejection fraction) (HCC) Chest CT showing asymmetric edema versus pneumonia as well as interstitial edema BNP elevated in the 900s Received a dose of Lasix  in the ED Will continue with IV Lasix  Continue other GDMT including losartan and metoprolol  Daily weights with intake and output monitoring Will update echocardiogram, last EF on record was 45 to 50% 12/2022  CAP (community acquired pneumonia) Leukocytosis of 12,000 though possibly related to current  prednisone Chest CT showing atypical edema versus pneumonia Will treat empirically with Rocephin and azithromycin Antitussives Will get a respiratory viral panel  COPD (chronic obstructive pulmonary disease) (HCC) Possible mild exacerbation though no wheezing heard Patient has a persistent cough and currently on prednisone by PCP Will treat with DuoNebs Q6 as needed Guaifenesin Continue supplemental oxygen as above  Obesity, Class III, BMI 40-49.9 (morbid obesity) (HCC) BMI 37.11 with comorbidities Complicating factor to overall prognosis and care  Non-traumatic compression fracture of T11 thoracic vertebra, initial encounter (HCC) History of prior osteoporotic compression fractures Intractable back pain with ambulatory dysfunction Neurosurgery consulted from the ED, avoid TLSO brace in the setting of possible pneumonia Multimodal pain control, NSAID, narcotic pain meds, lidocaine patch, gabapentin, muscle relaxants, heating pad Neurosurgery consult for in the a.m. for additional recommendations, with PT consulted after  Paroxysmal atrial fibrillation (HCC) Chronic anticoagulation No acute issues Continue Eliquis  and diltiazem  and metoprolol   CAD S/P proximal LAD stent (2003) No complaints of chest pain Continue current GDMT  History of TIA (transient ischemic attack) Continue Eliquis  Not currently on statin  Fatty liver History of fatty liver disease LFTs currently normal with slightly elevated bilirubin  HTN (hypertension) Controlled Continue home meds     DVT prophylaxis: Eliquis   Consults: Neurosurgery  Advance Care Planning:   Code Status: Prior   Family Communication: none  Disposition Plan: Back to previous home environment  Severity of Illness: The appropriate patient status for this patient is OBSERVATION. Observation status is judged to be reasonable and necessary in order to provide the required intensity of service to ensure the patient's safety.  The patient's presenting symptoms, physical exam findings, and initial radiographic and laboratory data in the context of their medical condition is felt to place them at decreased risk for further clinical deterioration. Furthermore, it is anticipated that the patient will be medically stable for discharge from the hospital within 2 midnights of admission.   Author: Delayne LULLA Solian, MD 01/09/2024 8:54 PM  For on call review [email or web address].

## 2024-01-09 NOTE — Assessment & Plan Note (Addendum)
 Possible CAP/acute bronchitis/HFrEF exacerbation  History of moderate pulmonary hypertension Likely multifactorial, CAP/CHF, severe PD with acute bronchitis, currently on prednisone O2 sats 88% with tachypnea Supplemental oxygen to keep sats over 92% and wean as tolerated Will treat each etiology as described on the respective problems

## 2024-01-09 NOTE — ED Triage Notes (Signed)
 Arrives via [facility] with C/O chronic low right sided back pain. Hx DJD, arthritis.  C/O last couple of days increasing pain with standing.  Recently started prednisone for back pain, not effective.  VS wnl. Hx Afib.

## 2024-01-09 NOTE — Assessment & Plan Note (Signed)
 Leukocytosis of 12,000 though possibly related to current prednisone Chest CT showing atypical edema versus pneumonia Will treat empirically with Rocephin and azithromycin Antitussives Will get a respiratory viral panel

## 2024-01-09 NOTE — Assessment & Plan Note (Addendum)
 BMI 37.11 with comorbidities Complicating factor to overall prognosis and care

## 2024-01-09 NOTE — Assessment & Plan Note (Signed)
 Possible mild exacerbation though no wheezing heard Patient has a persistent cough and currently on prednisone by PCP Will treat with DuoNebs Q6 as needed Guaifenesin Continue supplemental oxygen as above

## 2024-01-10 ENCOUNTER — Observation Stay
Admit: 2024-01-10 | Discharge: 2024-01-10 | Disposition: A | Payer: Medicare (Managed Care) | Attending: Internal Medicine

## 2024-01-10 ENCOUNTER — Encounter: Payer: Self-pay | Admitting: Internal Medicine

## 2024-01-10 DIAGNOSIS — Z87310 Personal history of (healed) osteoporosis fracture: Secondary | ICD-10-CM | POA: Diagnosis not present

## 2024-01-10 DIAGNOSIS — I5021 Acute systolic (congestive) heart failure: Secondary | ICD-10-CM | POA: Diagnosis present

## 2024-01-10 DIAGNOSIS — E7800 Pure hypercholesterolemia, unspecified: Secondary | ICD-10-CM | POA: Diagnosis present

## 2024-01-10 DIAGNOSIS — I509 Heart failure, unspecified: Secondary | ICD-10-CM

## 2024-01-10 DIAGNOSIS — I5023 Acute on chronic systolic (congestive) heart failure: Secondary | ICD-10-CM | POA: Diagnosis not present

## 2024-01-10 DIAGNOSIS — Z6837 Body mass index (BMI) 37.0-37.9, adult: Secondary | ICD-10-CM | POA: Diagnosis not present

## 2024-01-10 DIAGNOSIS — Z955 Presence of coronary angioplasty implant and graft: Secondary | ICD-10-CM | POA: Diagnosis not present

## 2024-01-10 DIAGNOSIS — G8929 Other chronic pain: Secondary | ICD-10-CM | POA: Diagnosis present

## 2024-01-10 DIAGNOSIS — S22000A Wedge compression fracture of unspecified thoracic vertebra, initial encounter for closed fracture: Secondary | ICD-10-CM

## 2024-01-10 DIAGNOSIS — J189 Pneumonia, unspecified organism: Secondary | ICD-10-CM | POA: Diagnosis present

## 2024-01-10 DIAGNOSIS — J9601 Acute respiratory failure with hypoxia: Secondary | ICD-10-CM | POA: Diagnosis present

## 2024-01-10 DIAGNOSIS — Z7982 Long term (current) use of aspirin: Secondary | ICD-10-CM | POA: Diagnosis not present

## 2024-01-10 DIAGNOSIS — R262 Difficulty in walking, not elsewhere classified: Secondary | ICD-10-CM | POA: Diagnosis present

## 2024-01-10 DIAGNOSIS — M8008XA Age-related osteoporosis with current pathological fracture, vertebra(e), initial encounter for fracture: Secondary | ICD-10-CM | POA: Diagnosis present

## 2024-01-10 DIAGNOSIS — S22080A Wedge compression fracture of T11-T12 vertebra, initial encounter for closed fracture: Secondary | ICD-10-CM

## 2024-01-10 DIAGNOSIS — Z1152 Encounter for screening for COVID-19: Secondary | ICD-10-CM | POA: Diagnosis not present

## 2024-01-10 DIAGNOSIS — Z7901 Long term (current) use of anticoagulants: Secondary | ICD-10-CM | POA: Diagnosis not present

## 2024-01-10 DIAGNOSIS — I2510 Atherosclerotic heart disease of native coronary artery without angina pectoris: Secondary | ICD-10-CM | POA: Diagnosis present

## 2024-01-10 DIAGNOSIS — K219 Gastro-esophageal reflux disease without esophagitis: Secondary | ICD-10-CM | POA: Diagnosis present

## 2024-01-10 DIAGNOSIS — Z8673 Personal history of transient ischemic attack (TIA), and cerebral infarction without residual deficits: Secondary | ICD-10-CM | POA: Diagnosis not present

## 2024-01-10 DIAGNOSIS — Z66 Do not resuscitate: Secondary | ICD-10-CM | POA: Diagnosis present

## 2024-01-10 DIAGNOSIS — M109 Gout, unspecified: Secondary | ICD-10-CM | POA: Diagnosis present

## 2024-01-10 DIAGNOSIS — I272 Pulmonary hypertension, unspecified: Secondary | ICD-10-CM | POA: Diagnosis present

## 2024-01-10 DIAGNOSIS — K760 Fatty (change of) liver, not elsewhere classified: Secondary | ICD-10-CM | POA: Diagnosis present

## 2024-01-10 DIAGNOSIS — I110 Hypertensive heart disease with heart failure: Secondary | ICD-10-CM | POA: Diagnosis present

## 2024-01-10 DIAGNOSIS — I480 Paroxysmal atrial fibrillation: Secondary | ICD-10-CM | POA: Diagnosis present

## 2024-01-10 DIAGNOSIS — J440 Chronic obstructive pulmonary disease with acute lower respiratory infection: Secondary | ICD-10-CM | POA: Diagnosis present

## 2024-01-10 DIAGNOSIS — E66813 Obesity, class 3: Secondary | ICD-10-CM | POA: Diagnosis present

## 2024-01-10 DIAGNOSIS — Z8249 Family history of ischemic heart disease and other diseases of the circulatory system: Secondary | ICD-10-CM | POA: Diagnosis not present

## 2024-01-10 HISTORY — DX: Wedge compression fracture of unspecified thoracic vertebra, initial encounter for closed fracture: S22.000A

## 2024-01-10 HISTORY — DX: Heart failure, unspecified: I50.9

## 2024-01-10 LAB — ECHOCARDIOGRAM COMPLETE
Area-P 1/2: 7.9 cm2
Height: 60 in
P 1/2 time: 1022 ms
S' Lateral: 2.7 cm
Weight: 3040 [oz_av]

## 2024-01-10 LAB — BASIC METABOLIC PANEL WITH GFR
Anion gap: 12 (ref 5–15)
BUN: 24 mg/dL — ABNORMAL HIGH (ref 8–23)
CO2: 30 mmol/L (ref 22–32)
Calcium: 8.3 mg/dL — ABNORMAL LOW (ref 8.9–10.3)
Chloride: 102 mmol/L (ref 98–111)
Creatinine, Ser: 0.95 mg/dL (ref 0.44–1.00)
GFR, Estimated: 58 mL/min — ABNORMAL LOW (ref >60)
Glucose, Bld: 154 mg/dL — ABNORMAL HIGH (ref 70–99)
Potassium: 3.7 mmol/L (ref 3.5–5.1)
Sodium: 144 mmol/L (ref 135–145)

## 2024-01-10 LAB — RESPIRATORY PANEL BY PCR

## 2024-01-10 LAB — CBC
HCT: 42.6 % (ref 36.0–46.0)
Hemoglobin: 14.2 g/dL (ref 12.0–15.0)
MCH: 34.7 pg — ABNORMAL HIGH (ref 26.0–34.0)
MCHC: 33.3 g/dL (ref 30.0–36.0)
MCV: 104.2 fL — ABNORMAL HIGH (ref 80.0–100.0)
Platelets: 191 K/uL (ref 150–400)
RBC: 4.09 MIL/uL (ref 3.87–5.11)
RDW: 12.6 % (ref 11.5–15.5)
WBC: 10.1 K/uL (ref 4.0–10.5)
nRBC: 0 % (ref 0.0–0.2)

## 2024-01-10 LAB — RESP PANEL BY RT-PCR (RSV, FLU A&B, COVID)  RVPGX2
Influenza A by PCR: NEGATIVE
Influenza B by PCR: NEGATIVE
Resp Syncytial Virus by PCR: NEGATIVE
SARS Coronavirus 2 by RT PCR: NEGATIVE

## 2024-01-10 LAB — LIPASE, BLOOD: Lipase: 58 U/L — ABNORMAL HIGH (ref 11–51)

## 2024-01-10 MED ORDER — ASPIRIN 81 MG PO CHEW: 81.0000 mg | CHEWABLE_TABLET | Freq: Every day | ORAL | Status: DC

## 2024-01-10 MED ORDER — METOPROLOL SUCCINATE ER 50 MG PO TB24
50.0000 mg | ORAL_TABLET | Freq: Every day | ORAL | Status: DC
  Administered 2024-01-10 – 2024-01-18 (×9): 50 mg via ORAL
  Filled 2024-01-10 (×9): qty 1

## 2024-01-10 MED ORDER — POLYETHYLENE GLYCOL 3350 17 G PO PACK
34.0000 g | PACK | Freq: Every day | ORAL | Status: DC
  Administered 2024-01-10 – 2024-01-18 (×3): 34 g via ORAL
  Filled 2024-01-10 (×9): qty 2

## 2024-01-10 MED ORDER — IPRATROPIUM-ALBUTEROL 0.5-2.5 (3) MG/3ML IN SOLN
3.0000 mL | Freq: Three times a day (TID) | RESPIRATORY_TRACT | Status: DC
  Administered 2024-01-10 – 2024-01-11 (×4): 3 mL via RESPIRATORY_TRACT
  Filled 2024-01-10 (×4): qty 3

## 2024-01-10 NOTE — Progress Notes (Signed)
 Heart Failure Nurse Navigator Progress Note  PCP: Montey Lot, PA-C PCP-Cardiologist: Alm HERO. Cowherd, MD  Admission Diagnosis: Community acquired pneumonia of right lower lobe of lung Thoracic compression fracture, closed, initial encounter (HCC) Acute on chronic congestive heart failure, unspecified heart failure type (HCC) Acute respiratory failure with hypoxia [facility])  Admitted from: Home via [facility]   Presentation:   Morgan Jacobs is a 89 y.o. female. She presented with acute chronic right -sided low back pain.  Pain is increased and worsened by standing.  Patient was also recently started on a course of prednisone for her persistent cough.  She also had some generalized fatigue and decreased range of motion and mobility.  BNP 954.9. Lactic acid 2.0.  Chest x-ray: Small left pleural effusion suspected and pulmonary vascular congestion.   ECHO/ LVEF: 50-50%  Clinical Course:  Past Medical History:  Diagnosis Date   Asthma    Benign essential tremor    CAD (coronary artery disease)    COPD (chronic obstructive pulmonary disease) (HCC)    Diverticulosis    Fatty liver    GERD (gastroesophageal reflux disease)    Gout    HTN (hypertension)    Hypercholesterolemia    Insomnia    Kidney stone    Osteoporosis    Parotid mass    Paroxysmal atrial fibrillation (HCC)    On Eliquis    TIA (transient ischemic attack)      Social History   Socioeconomic History   Marital status: Widowed    Spouse name: Not on file   Number of children: 2   Years of education: 8   Highest education level: Not on file  Occupational History   Not on file  Tobacco Use   Smoking status: Former    Current packs/day: 0.00    Types: Cigarettes    Quit date: 03/20/1956    Years since quitting: 67.8   Smokeless tobacco: Current    Types: Snuff  Vaping Use   Vaping status: Never Used  Substance and Sexual Activity   Alcohol use: Not Currently   Drug use: Never   Sexual activity: Not on file   Other Topics Concern   Not on file  Social History Narrative   09/28/21 lives w/daughter most of time   Social Drivers of Health   Financial Resource Strain: Low Risk  (01/10/2024)   Overall Financial Resource Strain (CARDIA)    Difficulty of Paying Living Expenses: Not very hard  Food Insecurity: No Food Insecurity (01/10/2024)   Hunger Vital Sign    Worried About Running Out of Food in the Last Year: Never true    Ran Out of Food in the Last Year: Never true  Transportation Needs: No Transportation Needs (01/09/2024)   PRAPARE - Administrator, Civil Service (Medical): No    Lack of Transportation (Non-Medical): No  Physical Activity: Not on file  Stress: Not on file  Social Connections: Not on file  Education Assessment and Provision:  Daughter [name] at the bedside for all CHF education and scheduling AHF TOC appointment.  Detailed education and instructions provided on heart failure disease management including the following:  Signs and symptoms of Heart Failure When to call the physician Importance of daily weights Low sodium diet Fluid restriction Medication management Anticipated future follow-up appointments  Patient education given on each of the above topics.  Patient acknowledges understanding via teach back method and acceptance of all instructions.  Education Materials:  Living Better With Heart Failure Booklet,  HF zone tool, & Daily Weight Tracker Tool.  Patient has scale at home: Yes Patient has pill box at home: Yes    High Risk Criteria for Readmission and/or Poor Patient Outcomes: Heart failure hospital admissions (last 6 months): 0  No Show rate: 0% Difficult social situation: None Demonstrates medication adherence: [name] her daughter prepares her medications in pill boxes. Patient has poor medication compliance though. Primary Language: English Literacy level: Reading, Writing & Comprehension  Barriers of Care:   Daily Weights-has  not been doing daily weights Diet & Fluid Restrictions Medication Non-Compliance Continued Heart Failure Education  Considerations/Referrals:  Referral made to Heart Failure Pharmacist Stewardship: N/A Referral made to Heart Failure CSW/NCM TOC: No Referral made to Heart & Vascular TOC clinic: AHF TOC 01/21/2024 @ 3:30  Items for Follow-up on DC/TOC: Daily Weights Diet & Fluid Restrictions Continued Heart Failure Education Medication Non-Compliance Nicotine (Snuff) Cessation  Charmaine Pines, RN, BSN [facility] Heart Failure Navigator Secure Chat Only

## 2024-01-10 NOTE — Progress Notes (Signed)
 Patient admitted to 1A Room 151 from ED due to mid Back Pain secondary to new T11 compression fracture, and acute on chronic low back pain due to progression of L4/5 compressure fractures. Patient A+Ox4. VSS. On acute 2 L of O2 via Nasal Cannula due to shortness of breath. Admission and assessment completed. Will continue to monitor and assess with plan of care.

## 2024-01-10 NOTE — Consult Note (Signed)
 Consulting Department:  Internal medicine  Primary Physician:  Montey Lot, PA-C  Chief Complaint: Back pain  History of Present Illness: 01/10/2024 Morgan Jacobs is a 89 y.o. female who presents with the chief complaint of worsened back pain.  She had low lumbar fractures from osteoporosis approximately 2 years ago and has had chronic severe pain ever since.  She states that she has almost daily symptoms secondary to the use issues.  Approximately 2 to 3 days ago has developed worsened right sided predominant back pain in the lower thoracic region.  She did not have any known traumas or falls.  She states that hurts all the time is causing severe pain that radiates to the right side of her lower thorax.  She has no new changes in her lower extremities.  No new changes to her bowel or bladder.  The symptoms are causing a significant impact on the patient's life.   Review of Systems:  A 10 point review of systems is negative, except for the pertinent positives and negatives detailed in the HPI.  Past Medical History: Past Medical History:  Diagnosis Date   Asthma    Benign essential tremor    CAD (coronary artery disease)    COPD (chronic obstructive pulmonary disease) (HCC)    Diverticulosis    Fatty liver    GERD (gastroesophageal reflux disease)    Gout    HTN (hypertension)    Hypercholesterolemia    Insomnia    Kidney stone    Osteoporosis    Parotid mass    Paroxysmal atrial fibrillation (HCC)    On Eliquis    TIA (transient ischemic attack)     Past Surgical History: Past Surgical History:  Procedure Laterality Date   CATARACT EXTRACTION     heart stent     TONSILLECTOMY AND ADENOIDECTOMY      Allergies: Allergies as of 01/09/2024   (No Known Allergies)    Medications:  Current Facility-Administered Medications:    acetaminophen  (TYLENOL ) tablet 650 mg, 650 mg, Oral, Q6H PRN, 650 mg at 01/09/24 2351 **OR** acetaminophen  (TYLENOL ) suppository 650 mg,  650 mg, Rectal, Q6H PRN, Duncan, Hazel V, MD   albuterol (PROVENTIL) (2.5 MG/3ML) 0.083% nebulizer solution 2.5 mg, 2.5 mg, Nebulization, Q2H PRN, Cleatus Delayne GAILS, MD   apixaban  (ELIQUIS ) tablet 5 mg, 5 mg, Oral, BID, Duncan, Hazel V, MD, 5 mg at 01/10/24 9074   diltiazem  (CARDIZEM  CD) 24 hr capsule 180 mg, 180 mg, Oral, Daily, Cleatus Delayne V, MD, 180 mg at 01/10/24 9074   furosemide  (LASIX ) injection 40 mg, 40 mg, Intravenous, BID, Cleatus Delayne V, MD, 40 mg at 01/10/24 0925   guaiFENesin (MUCINEX) 12 hr tablet 600 mg, 600 mg, Oral, BID, Cleatus Delayne V, MD, 600 mg at 01/10/24 9074   ipratropium-albuterol (DUONEB) 0.5-2.5 (3) MG/3ML nebulizer solution 3 mL, 3 mL, Nebulization, TID, Cleatus Delayne GAILS, MD, 3 mL at 01/10/24 9177   losartan (COZAAR) tablet 50 mg, 50 mg, Oral, Daily, Cleatus Delayne V, MD, 50 mg at 01/10/24 9074   methocarbamol (ROBAXIN) injection 500 mg, 500 mg, Intravenous, Q6H PRN, Cleatus Delayne GAILS, MD, 500 mg at 01/09/24 2355   methylPREDNISolone sodium succinate (SOLU-MEDROL) 40 mg/mL injection 40 mg, 40 mg, Intravenous, Q12H, 40 mg at 01/09/24 2352 **FOLLOWED BY** [START ON 01/11/2024] predniSONE (DELTASONE) tablet 40 mg, 40 mg, Oral, Q breakfast, Cleatus Delayne V, MD   morphine (PF) 2 MG/ML injection 2 mg, 2 mg, Intravenous, Q2H PRN, Duncan, Hazel V, MD   ondansetron (  ZOFRAN) tablet 4 mg, 4 mg, Oral, Q6H PRN **OR** ondansetron (ZOFRAN) injection 4 mg, 4 mg, Intravenous, Q6H PRN, Cleatus Delayne GAILS, MD   Oral care mouth rinse, 15 mL, Mouth Rinse, PRN, Cleatus Delayne GAILS, MD   oxyCODONE (Oxy IR/ROXICODONE) immediate release tablet 5 mg, 5 mg, Oral, Q4H PRN, Cleatus Delayne GAILS, MD   Social History: Social History   Tobacco Use   Smoking status: Former    Current packs/day: 0.00    Types: Cigarettes    Quit date: 03/20/1956    Years since quitting: 67.8   Smokeless tobacco: Current    Types: Snuff  Vaping Use   Vaping status: Never Used  Substance Use Topics   Alcohol use: Not Currently    Drug use: Never    Family Medical History: Family History  Problem Relation Age of Onset   Hypertension Mother    Heart failure Mother    Tremor Mother    Hypertension Father    Hypertension Sister    Cancer Sister        brain, bone, pancreatic   Tremor Sister    Hypertension Brother     Physical Examination: Vitals:   01/10/24 0822 01/10/24 0840  BP:  (!) 137/104  Pulse:  (!) 59  Resp:  16  Temp:  97.9 F (36.6 C)  SpO2: 95% 97%     General: Patient is well developed, well nourished, calm, collected, and in no apparent distress.  NEUROLOGICAL:  General: In no acute distress.   Awake, alert, oriented to person, place, and time.  Pupils equal round and reactive to light.  Facial tone is symmetric.  Tongue protrusion is midline.  There is no pronator drift.  Strength: She does have some significant pain limitation on her movements.  However she is at least antigravity in her bilateral knees when supported, bilateral ankles with plantarflexion and dorsiflexion.  Activation of her hip flexors causes severe pain in her back.  Imaging: CT Thoracic Spine Wo Contrast Result Date: 01/09/2024 EXAM: CT THORACIC AND LUMBAR SPINE WITHOUT INTRAVENOUS CONTRAST 01/09/2024 06:10:18 PM TECHNIQUE: CT of the thoracic and lumbar spine was performed without the administration of intravenous contrast. Multiplanar reformatted images are provided for review. Automated exposure control, iterative reconstruction, and/or weight based adjustment of the mA/kV was utilized to reduce the radiation dose to as low as reasonably achievable. Incidental adrenal and/or renal findings do not require follow up imaging. COMPARISON: Lumbar spine radiographs 10/11/2022. CLINICAL HISTORY: Mid-back pain, prior compression fracture. Chronic low right sided back pain. Hx DJD, arthritis. C/O last couple of days increasing pain with standing. Recently started prednisone for back pain, not effective. FINDINGS: THORACIC  SPINE: BONES AND ALIGNMENT: Suspected compression fracture of the superior endplate of T11 with an acute appearance. Less than 10% T11 vertebral body height loss. No retropulsion. Normal alignment. DEGENERATIVE CHANGES: Mild spondylosis. No severe thoracic spinal canal narrowing. SOFT TISSUES: Interlobular septal thickening in the lungs. Hazy airspace opacity in the right lower lobe. Small right greater than left pleural effusions. LUMBAR SPINE: BONES AND ALIGNMENT: Anterior compression fracture of L4 has increased from 10/11/2022. Additional compression fracture of L5 is new since 2024. 60% vertebral body height loss of L4 anteriorly. 25% vertebral body height loss of L5. 4 mm of retropulsion of the superior endplate of L4 and 4 mm of retropulsion of the superior endplate of L5. These are age indeterminate without recent comparison but favored chronic. Grade 1 retrolisthesis of L2. This is unchanged from prior. DEGENERATIVE  CHANGES: Multilevel spondylosis, disc space height loss, vacuum phenomenon, and degenerative endplate changes. These are greatest at L2-L3 where they are moderate. Moderate to advanced facet arthropathy in the lower lumbar spine. Spinal canal narrowing is greatest at L3-L4 where it is severe secondary to posterior disc bulge, retropulsion of the superior endplate of L4 (4 mm), ligamentum flavum hypertrophy, and facet arthropathy. The disc bulge at L3-L4 causes severe narrowing of the bilateral neural foramina. SOFT TISSUES: No acute abnormality. IMPRESSION: 1. Suspected acute compression fracture of the superior endplate of T11 with less than 10% vertebral body height loss. No retropulsion. 2. Presumed chronic compression fractures of L4 and L5 as described. 3. Severe spinal canal and bilateral neural foraminal narrowing at L3 - L4. 4. Hazy airspace opacity in the right lower lobe may be asymmetric edema or pneumonia. 5. Interstitial edema in the lungs with small bilateral pleural effusions.  Electronically signed by: Norman Gatlin MD 01/09/2024 06:30 PM EDT RP Workstation: [id number]   CT Lumbar Spine Wo Contrast Result Date: 01/09/2024 EXAM: CT THORACIC AND LUMBAR SPINE WITHOUT INTRAVENOUS CONTRAST 01/09/2024 06:10:18 PM TECHNIQUE: CT of the thoracic and lumbar spine was performed without the administration of intravenous contrast. Multiplanar reformatted images are provided for review. Automated exposure control, iterative reconstruction, and/or weight based adjustment of the mA/kV was utilized to reduce the radiation dose to as low as reasonably achievable. Incidental adrenal and/or renal findings do not require follow up imaging. COMPARISON: Lumbar spine radiographs 10/11/2022. CLINICAL HISTORY: Mid-back pain, prior compression fracture. Chronic low right sided back pain. Hx DJD, arthritis. C/O last couple of days increasing pain with standing. Recently started prednisone for back pain, not effective. FINDINGS: THORACIC SPINE: BONES AND ALIGNMENT: Suspected compression fracture of the superior endplate of T11 with an acute appearance. Less than 10% T11 vertebral body height loss. No retropulsion. Normal alignment. DEGENERATIVE CHANGES: Mild spondylosis. No severe thoracic spinal canal narrowing. SOFT TISSUES: Interlobular septal thickening in the lungs. Hazy airspace opacity in the right lower lobe. Small right greater than left pleural effusions. LUMBAR SPINE: BONES AND ALIGNMENT: Anterior compression fracture of L4 has increased from 10/11/2022. Additional compression fracture of L5 is new since 2024. 60% vertebral body height loss of L4 anteriorly. 25% vertebral body height loss of L5. 4 mm of retropulsion of the superior endplate of L4 and 4 mm of retropulsion of the superior endplate of L5. These are age indeterminate without recent comparison but favored chronic. Grade 1 retrolisthesis of L2. This is unchanged from prior. DEGENERATIVE CHANGES: Multilevel spondylosis, disc space height  loss, vacuum phenomenon, and degenerative endplate changes. These are greatest at L2-L3 where they are moderate. Moderate to advanced facet arthropathy in the lower lumbar spine. Spinal canal narrowing is greatest at L3-L4 where it is severe secondary to posterior disc bulge, retropulsion of the superior endplate of L4 (4 mm), ligamentum flavum hypertrophy, and facet arthropathy. The disc bulge at L3-L4 causes severe narrowing of the bilateral neural foramina. SOFT TISSUES: No acute abnormality. IMPRESSION: 1. Suspected acute compression fracture of the superior endplate of T11 with less than 10% vertebral body height loss. No retropulsion. 2. Presumed chronic compression fractures of L4 and L5 as described. 3. Severe spinal canal and bilateral neural foraminal narrowing at L3 - L4. 4. Hazy airspace opacity in the right lower lobe may be asymmetric edema or pneumonia. 5. Interstitial edema in the lungs with small bilateral pleural effusions. Electronically signed by: Norman Gatlin MD 01/09/2024 06:30 PM EDT RP Workstation: [id number]   DG  Chest 1 View Result Date: 01/09/2024 EXAM: 1 VIEW(S) XRAY OF THE CHEST 01/09/2024 04:16:00 PM COMPARISON: None available. CLINICAL HISTORY: Cough FINDINGS: LUNGS AND PLEURA: There is soft tissue attenuation artifact overlying the lower lung zones. Pulmonary vascular congestion. Small left pleural effusion suspected. Decreased aeration to the left lower lobe may reflect underlying atelectasis or airspace disease. Repeat imaging as the patient's clinical condition tolerates in the upright PA and lateral orientation advised. No pneumothorax. HEART AND MEDIASTINUM: Atherosclerotic plaque. Unchanged cardiac enlargement. BONES AND SOFT TISSUES: No acute osseous abnormality. IMPRESSION: 1. Diminished exam detail secondary to suboptimal patient positioning with soft tissue attenuation artifact overlying both lower lung zones. 2. Decreased aeration to the left lower lobe, possibly  representing atelectasis or airspace disease. 3. Small left pleural effusion suspected. 4. Pulmonary vascular congestion. Electronically signed by: Waddell Calk MD 01/09/2024 04:29 PM EDT RP Workstation: [id number]     DEXA: 2018 -> Osteoporosis   I have personally reviewed the images and agree with the above interpretation.  Labs:    Latest Ref Rng & Units 01/10/2024    5:04 AM 01/09/2024    3:27 PM 08/07/2021    3:30 AM  CBC  WBC 4.0 - 10.5 K/uL 10.1  12.5  7.5   Hemoglobin 12.0 - 15.0 g/dL 85.7  85.4  85.7   Hematocrit 36.0 - 46.0 % 42.6  42.6  40.8   Platelets 150 - 400 K/uL 191  202  180       Latest Ref Rng & Units 01/10/2024    5:04 AM 01/09/2024    3:27 PM 08/07/2021    3:30 AM  BMP  Glucose 70 - 99 mg/dL 845  894  879   BUN 8 - 23 mg/dL 24  22  17    Creatinine 0.44 - 1.00 mg/dL 9.04  9.16  8.89   Sodium 135 - 145 mmol/L 144  141  140   Potassium 3.5 - 5.1 mmol/L 3.7  3.5  3.7   Chloride 98 - 111 mmol/L 102  107  110   CO2 22 - 32 mmol/L 30  23  23    Calcium  8.9 - 10.3 mg/dL 8.3  8.4  9.0         Assessment and Plan: Ms. Gandolfi is a pleasant 89 y.o. female with history of lumbar compression fractures and has had chronic severe low back pain ever since that time.  She said approximately 2 to 3 days ago has had worsened mid back pain that radiates down to the right side of her chest and around her thorax.  She also feels tender to the touch in this location as well as in her right hip.  She states that she does not remember any recent falls.  She is not noticing any new neurologic symptoms with no new radiation to the lower extremities.  She has not had any acute changes to her bowel or bladder symptoms.  She mostly feels pain in her back.  We had a long discussion after physical examination which showed nonfocal physical exam.  She states that she would not be interested in any surgical intervention for her compression fractures.  I did let her know that she may feel  better with the TLSO brace as this could be supportive across the junction of her fracture.  She has had difficulty with braces before but is considering this is a possible intervention.  If she does choose to go forward with the TLSO bracing we just like to  get upright x-rays in the brace to have for baseline.  The patient and her daughter are hesitant to consider any possible kyphoplasty type intervention, I let them know that we often will wait for 4 to 6 weeks after initial diagnosis of compression fracture before moving forward with referral for kyphoplasty as many of these patients pain will subside.  They would like to hold off at this time given risks that they have previously researched.  At this time there are no plans for neurosurgical intervention.  Please reach out to us  if you have any further questions.  Penne MICAEL Sharps, MD/MSCR Dept. of Neurosurgery

## 2024-01-10 NOTE — Plan of Care (Signed)
  Problem: Education: Goal: Knowledge of General Education information will improve Description: Including pain rating scale, medication(s)/side effects and non-pharmacologic comfort measures Outcome: Progressing   Problem: Health Behavior/Discharge Planning: Goal: Ability to manage health-related needs will improve Outcome: Progressing   Problem: Clinical Measurements: Goal: Ability to maintain clinical measurements within normal limits will improve Outcome: Progressing   Problem: Clinical Measurements: Goal: Ability to maintain clinical measurements within normal limits will improve Outcome: Progressing Goal: Will remain free from infection Outcome: Progressing Goal: Diagnostic test results will improve Outcome: Progressing Goal: Respiratory complications will improve Outcome: Progressing Goal: Cardiovascular complication will be avoided Outcome: Progressing   Problem: Activity: Goal: Risk for activity intolerance will decrease Outcome: Progressing   Problem: Nutrition: Goal: Adequate nutrition will be maintained Outcome: Progressing   Problem: Coping: Goal: Level of anxiety will decrease Outcome: Progressing   Problem: Elimination: Goal: Will not experience complications related to bowel motility Outcome: Progressing Goal: Will not experience complications related to urinary retention Outcome: Progressing   Problem: Pain Managment: Goal: General experience of comfort will improve and/or be controlled Outcome: Progressing   Problem: Safety: Goal: Ability to remain free from injury will improve Outcome: Progressing   Problem: Skin Integrity: Goal: Risk for impaired skin integrity will decrease Outcome: Progressing   Problem: Education: Goal: Ability to demonstrate management of disease process will improve Outcome: Progressing Goal: Ability to verbalize understanding of medication therapies will improve Outcome: Progressing Goal: Individualized Educational  Video(s) Outcome: Progressing   Problem: Activity: Goal: Capacity to carry out activities will improve Outcome: Progressing   Problem: Cardiac: Goal: Ability to achieve and maintain adequate cardiopulmonary perfusion will improve Outcome: Progressing   Problem: Education: Goal: Knowledge of disease or condition will improve Outcome: Progressing Goal: Knowledge of the prescribed therapeutic regimen will improve Outcome: Progressing Goal: Individualized Educational Video(s) Outcome: Progressing   Problem: Respiratory: Goal: Ability to maintain a clear airway will improve Outcome: Progressing Goal: Levels of oxygenation will improve Outcome: Progressing Goal: Ability to maintain adequate ventilation will improve Outcome: Progressing   Problem: Activity: Goal: Ability to tolerate increased activity will improve Outcome: Progressing   Problem: Clinical Measurements: Goal: Ability to maintain a body temperature in the normal range will improve Outcome: Progressing   Problem: Respiratory: Goal: Ability to maintain adequate ventilation will improve Outcome: Progressing Goal: Ability to maintain a clear airway will improve Outcome: Progressing

## 2024-01-10 NOTE — Evaluation (Signed)
 Physical Therapy Evaluation Patient Details Name: Morgan Jacobs MRN: [id number] DOB: 26-Oct-1933 Today's Date: 01/10/2024  History of Present Illness  Pt is an 89 y/o F admitted on 01/09/24 after presenting with c/o intractable back pain 2/2 acute T1 compression fx. Also noted to have R lower lobe PNA with new O2 requirement. 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 Pt reports prior to admission she was residing with her daughter, ambulatory with rollator, but staying in bed a lot lately following the death of her son. On this date, pt c/o significant back pain but agreeable to OOB mobility with encouragement/education. Pt is able to transfer sit>stand with RW & cuing but poor return demo of hand placement despite education. Pt stands at sink with CGA for grooming tasks, & progresses to ambulating in hallway with RW & min assist. Pt with 1 episode of knee buckling during gait but with decreased awareness re: this. Educated pt on importance of OOB mobility & potential benefits of back brace with pt politely declining use of it at this time.        If plan is discharge home, recommend the following: A little help with walking and/or transfers;A little help with bathing/dressing/bathroom;Assistance with cooking/housework;Assist for transportation;Help with stairs or ramp for entrance   Can travel by private vehicle   Yes    Equipment Recommendations Other (comment) (defer to next venue)  Recommendations for Other Services  Rehab consult    Functional Status Assessment Patient has had a recent decline in their functional status and demonstrates the ability to make significant improvements in function in a reasonable and predictable amount of time.      Precautions / Restrictions Precautions Precautions: Fall;Back Precaution/Restrictions Comments: per neurosurgery note:she may feel better with the TLSO brace as this could be supportive across the junction of her fracture pt currently declining Restrictions Weight Bearing Restrictions Per Provider Order: No      Mobility  Bed Mobility                    Transfers Overall transfer level: Needs assistance Equipment used: Rolling walker (2 wheels) Transfers: Sit to/from Stand Sit to Stand: Contact guard assist           General transfer comment: poor awareness re: hand placement & decreased ability to follow cuing for pushing to standing, pulls up on RW    Ambulation/Gait Ambulation/Gait assistance: Min assist Gait Distance (Feet): 40 Feet Assistive device: Rolling walker (2 wheels) Gait Pattern/deviations: Decreased step length - right, Decreased step length - left, Decreased stride length Gait velocity: decreased     General Gait Details: pushes RW out in front of her, 1 episode of knee buckling but pt able to correct, does not appear to be aware of reduced safety with knee buckling, chair follow for safety  Stairs            Wheelchair Mobility     Tilt Bed    Modified Rankin (Stroke Patients Only)       Balance Overall balance assessment: Needs assistance Sitting-balance support: Feet supported Sitting balance-Leahy Scale: Good     Standing balance support: Bilateral upper extremity supported, During functional activity, Reliant on assistive device for balance Standing balance-Leahy Scale: Fair  Pertinent Vitals/Pain Pain Assessment Pain Assessment: Faces Pain Score: 10-Worst pain ever Pain Location: back with mobility Pain Descriptors / Indicators: Discomfort, Crying, Grimacing Pain Intervention(s): Monitored during session, Repositioned, Premedicated before session    Home Living  Family/patient expects to be discharged to:: Private residence Living Arrangements: Children Available Help at Discharge: Family;Available PRN/intermittently (daughter works 12 hr shifts) Type of Home: House Home Access: Stairs to enter Entrance Stairs-Rails: Right Entrance Stairs-Number of Steps: 2   Home Layout: One level Home Equipment: Agricultural consultant (2 wheels);Tub bench;BSC/3in1;Rollator (4 wheels)      Prior Function Prior Level of Function : Independent/Modified Independent             Mobility Comments: amb with RW, over the last few weeks it has only been in the house ADLs Comments: MOD I for dressing, grooming, feeding, toileting; sponge bathes recently; assist with IADLS from daugther (does not drive but has her license); pt/daugther report since the loss of her son a few weeks ago, decrease in overall activity/ability to perform ADLs     Extremity/Trunk Assessment   Upper Extremity Assessment Upper Extremity Assessment: Defer to OT evaluation    Lower Extremity Assessment Lower Extremity Assessment: Generalized weakness       Communication   Communication Communication: No apparent difficulties    Cognition Arousal: Alert Behavior During Therapy: WFL for tasks assessed/performed   PT - Cognitive impairments: Safety/Judgement, Awareness                         Following commands: Intact       Cueing Cueing Techniques: Verbal cues     General Comments General comments (skin integrity, edema, etc.): pt on 2L/min via nasal cannula throughout session, SpO2 >/= 90%    Exercises     Assessment/Plan    PT Assessment Patient needs continued PT services  PT Problem List Decreased strength;Cardiopulmonary status limiting activity;Pain;Decreased range of motion;Decreased activity tolerance;Decreased knowledge of use of DME;Decreased balance;Decreased safety awareness;Decreased mobility;Decreased knowledge of precautions       PT Treatment  Interventions DME instruction;Balance training;Gait training;Neuromuscular re-education;Stair training;Functional mobility training;Patient/family education;Therapeutic activities;Therapeutic exercise;Manual techniques    PT Goals (Current goals can be found in the Care Plan section)  Acute Rehab PT Goals Patient Stated Goal: decreased pain, go home PT Goal Formulation: With patient Time For Goal Achievement: 01/24/24 Potential to Achieve Goals: Good    Frequency Min 2X/week     Co-evaluation PT/OT/SLP Co-Evaluation/Treatment: Yes Reason for Co-Treatment: For patient/therapist safety;To address functional/ADL transfers PT goals addressed during session: Mobility/safety with mobility;Balance OT goals addressed during session: ADL's and self-care       AM-PAC PT 6 Clicks Mobility  Outcome Measure Help needed turning from your back to your side while in a flat bed without using bedrails?: A Little Help needed moving from lying on your back to sitting on the side of a flat bed without using bedrails?: A Lot Help needed moving to and from a bed to a chair (including a wheelchair)?: A Little Help needed standing up from a chair using your arms (e.g., wheelchair or bedside chair)?: A Little Help needed to walk in hospital room?: A Lot Help needed climbing 3-5 steps with a railing? : A Lot 6 Click Score: 15    End of Session   Activity Tolerance: Patient tolerated treatment well;Patient limited by pain Patient left: in chair;with chair alarm set;with call bell/phone within reach;with family/visitor present Nurse Communication: Mobility status PT Visit  Diagnosis: Pain;Muscle weakness (generalized) (M62.81);Unsteadiness on feet (R26.81) Pain - part of body:  (back)    Time: 8491-8466 PT Time Calculation (min) (ACUTE ONLY): 25 min   Charges:   PT Evaluation $PT Eval Moderate Complexity: 1 Mod   PT General Charges $$ ACUTE PT VISIT: 1 Visit         Richerd Pinal, PT,  DPT 01/10/24, 4:04 PM   Richerd CHRISTELLA Pinal 01/10/2024, 4:02 PM

## 2024-01-10 NOTE — Evaluation (Addendum)
 Occupational Therapy Evaluation Patient Details Name: Morgan Jacobs MRN: [id number] DOB: 10/23/1933 Today's Date: 01/10/2024   History of Present Illness   Pt is an 89 y/o F admitted on 01/09/24 after presenting with c/o intractable back pain 2/2 acute T1 compression fx. Also noted to have R lower lobe PNA with new O2 requirement. PMH: HFrEF, pulmonary HTN, a-fib on Eliquis , HTN, COPD, CAD s/p proximal LAD stent (2003), NASH, TIA (07/2021), parotid mass (12/2022), chronic back pain & hip pain, osteoporotic vertebral fx     Clinical Impressions Chart reviewed to date, pt greeted semi supine in bed, alert and oriented x4, intially resistant to mobility but ultimately agreeable with encouragement. PTA pt is generally MOD I-I for ADL, has assist  for IADL and amb with RW. Over the last few weeks she has been in bed a lot, amb to bathroom and to kitchen but has had a decrease in overall function (her son passed away earlier this month and she attributes decrease in activity to this). Pt presents with deficits in strength, endurance, activity tolerance, balance, affecting safe and optimal ADL completion. She is performing ADL/functional mobility below PLOF, will benefit from acute OT to address functional deficits and to facilitate optimal ADL/functional mobility performance. Please see further details below. Educated pt/daughter re rehab recommendations and safer ADL completion with AE/DME. Pt is left in bedside chair, all needs met. OT will follow.      If plan is discharge home, recommend the following:   A little help with walking and/or transfers;A little help with bathing/dressing/bathroom     Functional Status Assessment   Patient has had a recent decline in their functional status and demonstrates the ability to make significant improvements in function in a reasonable and predictable amount of time.     Equipment Recommendations   Other (comment) (defer to next venue of care)      Recommendations for Other Services         Precautions/Restrictions   Precautions Precautions: Fall Recall of Precautions/Restrictions: Intact Precaution/Restrictions Comments: per neurosurgery note:she may feel better with the TLSO brace as this could be supportive across the junction of her fracture pt currently declining Restrictions Weight Bearing Restrictions Per Provider Order: No     Mobility Bed Mobility Overal bed mobility: Needs Assistance Bed Mobility: Rolling, Supine to Sit Rolling: Contact guard assist   Supine to sit: Min assist     General bed mobility comments: step by step vcs for technique    Transfers Overall transfer level: Needs assistance Equipment used: Rolling walker (2 wheels) Transfers: Sit to/from Stand Sit to Stand: Min assist, Contact guard assist           General transfer comment: frequent vcs for technique      Balance Overall balance assessment: Needs assistance Sitting-balance support: Feet supported Sitting balance-Leahy Scale: Good     Standing balance support: Bilateral upper extremity supported, During functional activity, Reliant on assistive device for balance Standing balance-Leahy Scale: Fair                             ADL either performed or assessed with clinical judgement   ADL Overall ADL's : Needs assistance/impaired     Grooming: Minimal assistance;Standing;Oral care Grooming Details (indicate cue type and reason): with RW at sink level         Upper Body Dressing : Minimal assistance Upper Body Dressing Details (indicate cue type and reason): donn gown Lower  Body Dressing: Maximal assistance Lower Body Dressing Details (indicate cue type and reason): socks Toilet Transfer: Minimal assistance;Rolling walker (2 wheels);Ambulation Toilet Transfer Details (indicate cue type and reason): simulated with RW Toileting- Clothing Manipulation and Hygiene: Maximal assistance        Functional mobility during ADLs: Minimal assistance;Rolling walker (2 wheels) (+2 for chair follow, approx 40, one LOB with R knee buckling)       Vision Patient Visual Report: No change from baseline       Perception         Praxis         Pertinent Vitals/Pain Pain Assessment Pain Assessment: 0-10 Pain Score: 10-Worst pain ever Pain Location: back with mobility Pain Descriptors / Indicators: Discomfort, Crying, Grimacing Pain Intervention(s): Repositioned, Monitored during session, Premedicated before session     Extremity/Trunk Assessment Upper Extremity Assessment Upper Extremity Assessment: Defer to OT evaluation   Lower Extremity Assessment Lower Extremity Assessment: Generalized weakness       Communication Communication Communication: No apparent difficulties   Cognition Arousal: Alert Behavior During Therapy: WFL for tasks assessed/performed Cognition: No apparent impairments                               Following commands: Intact       Cueing  General Comments   Cueing Techniques: Verbal cues  pt on 2L/min via nasal cannula throughout session, SpO2 >/= 90%   Exercises Other Exercises Other Exercises: edu pt/daugther re role of OT, role of rehab, discharge recommendations, safe ADL completion with AE/DME   Shoulder Instructions      Home Living Family/patient expects to be discharged to:: Private residence Living Arrangements: Children Available Help at Discharge: Family;Available PRN/intermittently (daughter works 12 hr shifts) Type of Home: House Home Access: Stairs to enter Secretary/administrator of Steps: 2 Entrance Stairs-Rails: Right Home Layout: One level     Bathroom Shower/Tub: Tub/shower unit;Sponge bathes at baseline   Bathroom Toilet: Handicapped height     Home Equipment: Agricultural consultant (2 wheels);Tub bench;BSC/3in1;Rollator (4 wheels)          Prior Functioning/Environment Prior Level of Function :  Independent/Modified Independent             Mobility Comments: amb with RW, over the last few weeks it has only been in the house ADLs Comments: MOD I for dressing, grooming, feeding, toileting; tub transfer bench to shower; assist with IADLS from daugther (does not drive but has her license); pt/daugther report since the loss of her son a few weeks ago, decrease in overall activity/ability to perform ADLs    OT Problem List: Decreased strength;Decreased activity tolerance;Decreased knowledge of use of DME or AE;Decreased safety awareness;Impaired balance (sitting and/or standing)   OT Treatment/Interventions: Self-care/ADL training;Therapeutic exercise;Balance training;Neuromuscular education;Therapeutic activities;Energy conservation;DME and/or AE instruction;Patient/family education      OT Goals(Current goals can be found in the care plan section)   Acute Rehab OT Goals Patient Stated Goal: go home OT Goal Formulation: With patient Time For Goal Achievement: 01/23/24 Potential to Achieve Goals: Fair ADL Goals Pt Will Perform Grooming: with modified independence;sitting;standing Pt Will Perform Lower Body Dressing: with modified independence;sitting/lateral leans;sit to/from stand Pt Will Transfer to Toilet: with modified independence;ambulating Pt Will Perform Toileting - Clothing Manipulation and hygiene: with modified independence;sitting/lateral leans;sit to/from stand   OT Frequency:  Min 2X/week    Co-evaluation PT/OT/SLP Co-Evaluation/Treatment: Yes Reason for Co-Treatment: For patient/therapist safety;To address functional/ADL transfers  PT goals addressed during session: Mobility/safety with mobility;Balance OT goals addressed during session: ADL's and self-care      AM-PAC OT 6 Clicks Daily Activity     Outcome Measure Help from another person eating meals?: None Help from another person taking care of personal grooming?: None Help from another person  toileting, which includes using toliet, bedpan, or urinal?: A Lot Help from another person bathing (including washing, rinsing, drying)?: A Lot Help from another person to put on and taking off regular upper body clothing?: A Little Help from another person to put on and taking off regular lower body clothing?: A Lot 6 Click Score: 17   End of Session Equipment Utilized During Treatment: Rolling walker (2 wheels) Nurse Communication: Mobility status  Activity Tolerance: Patient tolerated treatment well Patient left: in chair;with call bell/phone within reach;with chair alarm set;with family/visitor present  OT Visit Diagnosis: Other abnormalities of gait and mobility (R26.89);Muscle weakness (generalized) (M62.81)                Time: 8542-8465 OT Time Calculation (min): 37 min Charges:  OT General Charges $OT Visit: 1 Visit OT Evaluation $OT Eval Moderate Complexity: 1 Mod Therisa Sheffield, OTD OTR/L  01/10/24, 4:03 PM

## 2024-01-10 NOTE — Plan of Care (Signed)
  Problem: Education: Goal: Knowledge of General Education information will improve Description: Including pain rating scale, medication(s)/side effects and non-pharmacologic comfort measures Outcome: Progressing   Problem: Health Behavior/Discharge Planning: Goal: Ability to manage health-related needs will improve Outcome: Progressing   Problem: Clinical Measurements: Goal: Ability to maintain clinical measurements within normal limits will improve Outcome: Progressing Goal: Will remain free from infection Outcome: Progressing Goal: Diagnostic test results will improve Outcome: Progressing Goal: Respiratory complications will improve Outcome: Progressing Goal: Cardiovascular complication will be avoided Outcome: Progressing   Problem: Activity: Goal: Risk for activity intolerance will decrease Outcome: Progressing   Problem: Nutrition: Goal: Adequate nutrition will be maintained Outcome: Progressing   Problem: Coping: Goal: Level of anxiety will decrease Outcome: Progressing   Problem: Elimination: Goal: Will not experience complications related to bowel motility Outcome: Progressing Goal: Will not experience complications related to urinary retention Outcome: Progressing   Problem: Pain Managment: Goal: General experience of comfort will improve and/or be controlled Outcome: Progressing   Problem: Safety: Goal: Ability to remain free from injury will improve Outcome: Progressing   Problem: Skin Integrity: Goal: Risk for impaired skin integrity will decrease Outcome: Progressing   Problem: Education: Goal: Ability to demonstrate management of disease process will improve Outcome: Progressing Goal: Ability to verbalize understanding of medication therapies will improve Outcome: Progressing Goal: Individualized Educational Video(s) Outcome: Progressing   Problem: Activity: Goal: Capacity to carry out activities will improve Outcome: Progressing    Problem: Cardiac: Goal: Ability to achieve and maintain adequate cardiopulmonary perfusion will improve Outcome: Progressing   Problem: Education: Goal: Knowledge of disease or condition will improve Outcome: Progressing Goal: Knowledge of the prescribed therapeutic regimen will improve Outcome: Progressing Goal: Individualized Educational Video(s) Outcome: Progressing   Problem: Activity: Goal: Ability to tolerate increased activity will improve Outcome: Progressing Goal: Will verbalize the importance of balancing activity with adequate rest periods Outcome: Progressing   Problem: Respiratory: Goal: Ability to maintain a clear airway will improve Outcome: Progressing Goal: Levels of oxygenation will improve Outcome: Progressing Goal: Ability to maintain adequate ventilation will improve Outcome: Progressing   Problem: Activity: Goal: Ability to tolerate increased activity will improve Outcome: Progressing   Problem: Clinical Measurements: Goal: Ability to maintain a body temperature in the normal range will improve Outcome: Progressing   Problem: Respiratory: Goal: Ability to maintain adequate ventilation will improve Outcome: Progressing Goal: Ability to maintain a clear airway will improve Outcome: Progressing

## 2024-01-10 NOTE — Progress Notes (Signed)
 PROGRESS NOTE    Morgan Jacobs  FMW:[id number] DOB: [birth year] DOA: 01/09/2024 PCP: Montey Lot, PA-C      Brief Narrative:   Morgan Jacobs is a 89 y.o. female with medical history significant for HFrEF (EF 45 to 50%, 12/2022), pulmonary hypertension, A-fib on Eliquis , HTN, COPD, CAD s/p proximal LAD stent (2003), NASH,  TIA(07/2021), parotid mass noted in 12/2022, chronic back pain and hip pain with history of osteoporotic vertebral fracture being admitted with intractable back pain secondary to an acute T1 compression fracture, as well as a right lower lobe pneumonia seen on CT with new O2 requirement of 2 L. Patient was initially brought in by EMS with a several day history of worsening of her chronic right-sided low back pain causing her to stay in bed which is not normal for her.  Her decreased mobility was initially attributed to her burying her son recently.  Additionally, patient has had a cough for the past month, seeing her PCP on 2 occasions for which she was started on prednisone without improvement.  She has had no chest pain, lower extremity pain or swelling. In the ED afebrile, tachypneic to 22, normal pulse and blood pressure, O2 sat 88 on room air requiring 2 L to get to the mid to high 90s. Labs notable for WBC 12.5 with lactic acid 1.2.  BNP 954 Total bilirubin 1.5 UA unremarkable Respiratory viral panel not done EKG not done CT lumbar and thoracic spine showing acute compression fracture superior endplate of T11 without retropulsion and less than 10% vertebral body height loss with abnormal chronic findings CT T-spine also showed airspace opacity right lower lobe possibly asymmetric edema versus pneumonia as well as interstitial edema in the lungs with small bilateral pleural effusions Patient treated with a dose of IV Lasix  Started on Rocephin and azithromycin for community-acquired pneumonia The ED provider: Spoke with neurosurgeon, Dr. Penne Sharps who recommended no  TLSO brace due to possible pneumonia, acute intervention but will see in the morning.   Assessment & Plan:   Principal Problem:   Acute respiratory failure with hypoxia (HCC) Active Problems:   CAP (community acquired pneumonia)   Acute on chronic HFrEF (heart failure with reduced ejection fraction) (HCC)   COPD (chronic obstructive pulmonary disease) (HCC)   Non-traumatic compression fracture of T11 thoracic vertebra, initial encounter (HCC)   Intractable back pain   Ambulatory dysfunction   Obesity, Class III, BMI 40-49.9 (morbid obesity) (HCC)   Paroxysmal atrial fibrillation (HCC)   HTN (hypertension)   Stage 3a chronic kidney disease (CKD) (HCC)   History of osteoporotic pathological fracture   Fatty liver   History of TIA (transient ischemic attack)   Pulmonary hypertension (HCC)   CAD S/P proximal LAD stent (2003)   Acute on chronic congestive heart failure (HCC)   Thoracic compression fracture, closed, initial encounter (HCC)  # T11 compression fracture Nontraumatic. Seen by neurosurg, surgery deferred - patient considering tlso brace for comfort - pain control - pt/ot  # CHF with acute exacerbation Imaging consistent with pulm edema. Several rounds recent steroids, no wheeze. No fever or cough to suggest pna, does have mild leukocytosis. Covid/flu/rsv neg - will hold on additional abx and steroids for now - f/u rvp - continue diuresis furosemide  40 bid for home lasix  20 - f/u tte - cont home metop do not think decompensated  # Acute hypoxic respiratory failure 2/2 above, 88 in ER, resolved with 2 liters. Dyspneic on arrival - [facility] O2 wean as able  #  CAD DES to LAD around 2003. No chest pain - home apixaban , metop - will hold home asa  # COPD Do not think exacerbated as above  # A-fib Rate controlled - cont home metop, apixaban , diltiazem   # HTN controlled - home metop, dilt - hold home losartan while diuresing  # CKD 3a At baseline -  monitor   DVT prophylaxis: apixaban  Code Status: dnr/dni, confirmed with daughter at bedside on 10/23 Family Communication: daughter at bedside 10/23  Level of care: Telemetry Medical Status is: Observation     Consultants:  neurosurgery  Procedures: none  Antimicrobials:  See above    Subjective: Reports stable low back pain  Objective: Vitals:   01/10/24 0429 01/10/24 0637 01/10/24 0822 01/10/24 0840  BP: 106/66   (!) 137/104  Pulse: (!) 42 78  (!) 59  Resp: 19   16  Temp: 98.2 F (36.8 C)   97.9 F (36.6 C)  TempSrc:      SpO2: 95%  95% 97%  Weight:      Height:        Intake/Output Summary (Last 24 hours) at 01/10/2024 1146 Last data filed at 01/10/2024 0500 Gross per 24 hour  Intake 100 ml  Output 1500 ml  Net -1400 ml   Filed Weights   01/09/24 1522  Weight: 86.2 kg    Examination:  General exam: Appears calm and comfortable  Respiratory system: bibasilar rales, normal wob, no wheeze Cardiovascular system: S1 & S2 heard, RRR. No JVD, murmurs, rubs, gallops or clicks. trace pedal edema. Gastrointestinal system: Abdomen is nondistended, soft and nontender. No organomegaly or masses felt. Normal bowel sounds heard. Central nervous system: Alert and oriented. No focal neurological deficits. Extremities: Symmetric 5 x 5 power. Skin: No rashes, lesions or ulcers Psychiatry: Judgement and insight appear normal. Mood & affect appropriate.     Data Reviewed: I have personally reviewed following labs and imaging studies  CBC: Recent Labs  Lab 01/09/24 1527 01/10/24 0504  WBC 12.5* 10.1  NEUTROABS 10.1*  --   HGB 14.5 14.2  HCT 42.6 42.6  MCV 103.6* 104.2*  PLT 202 191   Basic Metabolic Panel: Recent Labs  Lab 01/09/24 1527 01/10/24 0504  NA 141 144  K 3.5 3.7  CL 107 102  CO2 23 30  GLUCOSE 105* 154*  BUN 22 24*  CREATININE 0.83 0.95  CALCIUM  8.4* 8.3*   GFR: Estimated Creatinine Clearance: 39.9 mL/min (by C-G formula based on  SCr of 0.95 mg/dL). Liver Function Tests: Recent Labs  Lab 01/09/24 1527  AST 30  ALT 41  ALKPHOS 74  BILITOT 1.5*  PROT 6.5  ALBUMIN 3.1*   Recent Labs  Lab 01/09/24 1527  LIPASE 58*   No results for input(s): AMMONIA in the last 168 hours. Coagulation Profile: No results for input(s): INR, PROTIME in the last 168 hours. Cardiac Enzymes: No results for input(s): CKTOTAL, CKMB, CKMBINDEX, TROPONINI in the last 168 hours. BNP (last 3 results) No results for input(s): PROBNP in the last 8760 hours. HbA1C: No results for input(s): HGBA1C in the last 72 hours. CBG: No results for input(s): GLUCAP in the last 168 hours. Lipid Profile: No results for input(s): CHOL, HDL, LDLCALC, TRIG, CHOLHDL, LDLDIRECT in the last 72 hours. Thyroid  Function Tests: No results for input(s): TSH, T4TOTAL, FREET4, T3FREE, THYROIDAB in the last 72 hours. Anemia Panel: No results for input(s): VITAMINB12, FOLATE, FERRITIN, TIBC, IRON, RETICCTPCT in the last 72 hours. Urine analysis:  Component Value Date/Time   COLORURINE YELLOW (A) 01/09/2024 1630   APPEARANCEUR CLEAR (A) 01/09/2024 1630   LABSPEC 1.013 01/09/2024 1630   PHURINE 6.0 01/09/2024 1630   GLUCOSEU NEGATIVE 01/09/2024 1630   HGBUR NEGATIVE 01/09/2024 1630   BILIRUBINUR NEGATIVE 01/09/2024 1630   KETONESUR NEGATIVE 01/09/2024 1630   PROTEINUR NEGATIVE 01/09/2024 1630   NITRITE NEGATIVE 01/09/2024 1630   LEUKOCYTESUR NEGATIVE 01/09/2024 1630   Sepsis Labs: @LABRCNTIP (procalcitonin:4,lacticidven:4)  ) Recent Results (from the past 240 hours)  Blood culture (single)     Status: None (Preliminary result)   Collection Time: 01/09/24  8:11 PM   Specimen: BLOOD  Result Value Ref Range Status   Specimen Description BLOOD BLOOD RIGHT ARM  Final   Special Requests   Final    Blood Culture results may not be optimal due to an inadequate volume of blood received in culture  bottles BOTTLES DRAWN AEROBIC AND ANAEROBIC   Culture   Final    NO GROWTH < 12 HOURS Performed at [facility], [address], [place], [place] [address]    Report Status PENDING  Incomplete  Resp panel by RT-PCR (RSV, Flu A&B, Covid) Anterior Nasal Swab     Status: None   Collection Time: 01/09/24 11:17 PM   Specimen: Anterior Nasal Swab  Result Value Ref Range Status   SARS Coronavirus 2 by RT PCR NEGATIVE NEGATIVE Final    Comment: (NOTE) SARS-CoV-2 target nucleic acids are NOT DETECTED.  The SARS-CoV-2 RNA is generally detectable in upper respiratory specimens during the acute phase of infection. The lowest concentration of SARS-CoV-2 viral copies this assay can detect is 138 copies/mL. A negative result does not preclude SARS-Cov-2 infection and should not be used as the sole basis for treatment or other patient management decisions. A negative result may occur with  improper specimen collection/handling, submission of specimen other than nasopharyngeal swab, presence of viral mutation(s) within the areas targeted by this assay, and inadequate number of viral copies(<138 copies/mL). A negative result must be combined with clinical observations, patient history, and epidemiological information. The expected result is Negative.  Fact Sheet for Patients:  BloggerCourse.com  Fact Sheet for Healthcare Providers:  SeriousBroker.it  This test is no t yet approved or cleared by the [place]  FDA and  has been authorized for detection and/or diagnosis of SARS-CoV-2 by FDA under an Emergency Use Authorization (EUA). This EUA will remain  in effect (meaning this test can be used) for the duration of the COVID-19 declaration under Section 564(b)(1) of the Act, 21 U.S.C.section 360bbb-3(b)(1), unless the authorization is terminated  or revoked sooner.       Influenza A by PCR NEGATIVE NEGATIVE Final   Influenza B by  PCR NEGATIVE NEGATIVE Final    Comment: (NOTE) The Xpert Xpress SARS-CoV-2/FLU/RSV plus assay is intended as an aid in the diagnosis of influenza from Nasopharyngeal swab specimens and should not be used as a sole basis for treatment. Nasal washings and aspirates are unacceptable for Xpert Xpress SARS-CoV-2/FLU/RSV testing.  Fact Sheet for Patients: BloggerCourse.com  Fact Sheet for Healthcare Providers: SeriousBroker.it  This test is not yet approved or cleared by the [place]  FDA and has been authorized for detection and/or diagnosis of SARS-CoV-2 by FDA under an Emergency Use Authorization (EUA). This EUA will remain in effect (meaning this test can be used) for the duration of the COVID-19 declaration under Section 564(b)(1) of the Act, 21 U.S.C. section 360bbb-3(b)(1), unless the authorization is terminated or revoked.  Resp Syncytial Virus by PCR NEGATIVE NEGATIVE Final    Comment: (NOTE) Fact Sheet for Patients: BloggerCourse.com  Fact Sheet for Healthcare Providers: SeriousBroker.it  This test is not yet approved or cleared by the [place]  FDA and has been authorized for detection and/or diagnosis of SARS-CoV-2 by FDA under an Emergency Use Authorization (EUA). This EUA will remain in effect (meaning this test can be used) for the duration of the COVID-19 declaration under Section 564(b)(1) of the Act, 21 U.S.C. section 360bbb-3(b)(1), unless the authorization is terminated or revoked.  Performed at [facility], [address], [place], [place] [address]          Radiology Studies: CT Thoracic Spine Wo Contrast Result Date: 01/09/2024 EXAM: CT THORACIC AND LUMBAR SPINE WITHOUT INTRAVENOUS CONTRAST 01/09/2024 06:10:18 PM TECHNIQUE: CT of the thoracic and lumbar spine was performed without the administration of intravenous contrast.  Multiplanar reformatted images are provided for review. Automated exposure control, iterative reconstruction, and/or weight based adjustment of the mA/kV was utilized to reduce the radiation dose to as low as reasonably achievable. Incidental adrenal and/or renal findings do not require follow up imaging. COMPARISON: Lumbar spine radiographs 10/11/2022. CLINICAL HISTORY: Mid-back pain, prior compression fracture. Chronic low right sided back pain. Hx DJD, arthritis. C/O last couple of days increasing pain with standing. Recently started prednisone for back pain, not effective. FINDINGS: THORACIC SPINE: BONES AND ALIGNMENT: Suspected compression fracture of the superior endplate of T11 with an acute appearance. Less than 10% T11 vertebral body height loss. No retropulsion. Normal alignment. DEGENERATIVE CHANGES: Mild spondylosis. No severe thoracic spinal canal narrowing. SOFT TISSUES: Interlobular septal thickening in the lungs. Hazy airspace opacity in the right lower lobe. Small right greater than left pleural effusions. LUMBAR SPINE: BONES AND ALIGNMENT: Anterior compression fracture of L4 has increased from 10/11/2022. Additional compression fracture of L5 is new since 2024. 60% vertebral body height loss of L4 anteriorly. 25% vertebral body height loss of L5. 4 mm of retropulsion of the superior endplate of L4 and 4 mm of retropulsion of the superior endplate of L5. These are age indeterminate without recent comparison but favored chronic. Grade 1 retrolisthesis of L2. This is unchanged from prior. DEGENERATIVE CHANGES: Multilevel spondylosis, disc space height loss, vacuum phenomenon, and degenerative endplate changes. These are greatest at L2-L3 where they are moderate. Moderate to advanced facet arthropathy in the lower lumbar spine. Spinal canal narrowing is greatest at L3-L4 where it is severe secondary to posterior disc bulge, retropulsion of the superior endplate of L4 (4 mm), ligamentum flavum  hypertrophy, and facet arthropathy. The disc bulge at L3-L4 causes severe narrowing of the bilateral neural foramina. SOFT TISSUES: No acute abnormality. IMPRESSION: 1. Suspected acute compression fracture of the superior endplate of T11 with less than 10% vertebral body height loss. No retropulsion. 2. Presumed chronic compression fractures of L4 and L5 as described. 3. Severe spinal canal and bilateral neural foraminal narrowing at L3 - L4. 4. Hazy airspace opacity in the right lower lobe may be asymmetric edema or pneumonia. 5. Interstitial edema in the lungs with small bilateral pleural effusions. Electronically signed by: Norman Gatlin MD 01/09/2024 06:30 PM EDT RP Workstation: [id number]   CT Lumbar Spine Wo Contrast Result Date: 01/09/2024 EXAM: CT THORACIC AND LUMBAR SPINE WITHOUT INTRAVENOUS CONTRAST 01/09/2024 06:10:18 PM TECHNIQUE: CT of the thoracic and lumbar spine was performed without the administration of intravenous contrast. Multiplanar reformatted images are provided for review. Automated exposure control, iterative reconstruction, and/or weight based adjustment  of the mA/kV was utilized to reduce the radiation dose to as low as reasonably achievable. Incidental adrenal and/or renal findings do not require follow up imaging. COMPARISON: Lumbar spine radiographs 10/11/2022. CLINICAL HISTORY: Mid-back pain, prior compression fracture. Chronic low right sided back pain. Hx DJD, arthritis. C/O last couple of days increasing pain with standing. Recently started prednisone for back pain, not effective. FINDINGS: THORACIC SPINE: BONES AND ALIGNMENT: Suspected compression fracture of the superior endplate of T11 with an acute appearance. Less than 10% T11 vertebral body height loss. No retropulsion. Normal alignment. DEGENERATIVE CHANGES: Mild spondylosis. No severe thoracic spinal canal narrowing. SOFT TISSUES: Interlobular septal thickening in the lungs. Hazy airspace opacity in the right lower  lobe. Small right greater than left pleural effusions. LUMBAR SPINE: BONES AND ALIGNMENT: Anterior compression fracture of L4 has increased from 10/11/2022. Additional compression fracture of L5 is new since 2024. 60% vertebral body height loss of L4 anteriorly. 25% vertebral body height loss of L5. 4 mm of retropulsion of the superior endplate of L4 and 4 mm of retropulsion of the superior endplate of L5. These are age indeterminate without recent comparison but favored chronic. Grade 1 retrolisthesis of L2. This is unchanged from prior. DEGENERATIVE CHANGES: Multilevel spondylosis, disc space height loss, vacuum phenomenon, and degenerative endplate changes. These are greatest at L2-L3 where they are moderate. Moderate to advanced facet arthropathy in the lower lumbar spine. Spinal canal narrowing is greatest at L3-L4 where it is severe secondary to posterior disc bulge, retropulsion of the superior endplate of L4 (4 mm), ligamentum flavum hypertrophy, and facet arthropathy. The disc bulge at L3-L4 causes severe narrowing of the bilateral neural foramina. SOFT TISSUES: No acute abnormality. IMPRESSION: 1. Suspected acute compression fracture of the superior endplate of T11 with less than 10% vertebral body height loss. No retropulsion. 2. Presumed chronic compression fractures of L4 and L5 as described. 3. Severe spinal canal and bilateral neural foraminal narrowing at L3 - L4. 4. Hazy airspace opacity in the right lower lobe may be asymmetric edema or pneumonia. 5. Interstitial edema in the lungs with small bilateral pleural effusions. Electronically signed by: Norman Gatlin MD 01/09/2024 06:30 PM EDT RP Workstation: [id number]   DG Chest 1 View Result Date: 01/09/2024 EXAM: 1 VIEW(S) XRAY OF THE CHEST 01/09/2024 04:16:00 PM COMPARISON: None available. CLINICAL HISTORY: Cough FINDINGS: LUNGS AND PLEURA: There is soft tissue attenuation artifact overlying the lower lung zones. Pulmonary vascular congestion.  Small left pleural effusion suspected. Decreased aeration to the left lower lobe may reflect underlying atelectasis or airspace disease. Repeat imaging as the patient's clinical condition tolerates in the upright PA and lateral orientation advised. No pneumothorax. HEART AND MEDIASTINUM: Atherosclerotic plaque. Unchanged cardiac enlargement. BONES AND SOFT TISSUES: No acute osseous abnormality. IMPRESSION: 1. Diminished exam detail secondary to suboptimal patient positioning with soft tissue attenuation artifact overlying both lower lung zones. 2. Decreased aeration to the left lower lobe, possibly representing atelectasis or airspace disease. 3. Small left pleural effusion suspected. 4. Pulmonary vascular congestion. Electronically signed by: Waddell Calk MD 01/09/2024 04:29 PM EDT RP Workstation: [id number]        Scheduled Meds:  apixaban   5 mg Oral BID   diltiazem   180 mg Oral Daily   furosemide   40 mg Intravenous BID   guaiFENesin  600 mg Oral BID   ipratropium-albuterol  3 mL Nebulization TID   losartan  50 mg Oral Daily   methylPREDNISolone (SOLU-MEDROL) injection  40 mg Intravenous Q12H   Followed by   [  START ON 01/11/2024] predniSONE  40 mg Oral Q breakfast   Continuous Infusions:   LOS: 0 days     Devaughn KATHEE Ban, MD [facility]   If 7PM-7AM, please contact night-coverage [email or web address] Password [email or web address] 01/10/2024, 11:46 AM

## 2024-01-10 NOTE — Progress Notes (Signed)
 Notified by CCMD that pt had 2.18 sec SVR pause. Pt asymptomatic, VS stable. [place], MD notified.

## 2024-01-11 DIAGNOSIS — J9601 Acute respiratory failure with hypoxia: Secondary | ICD-10-CM | POA: Diagnosis not present

## 2024-01-11 LAB — CBC
HCT: 42.3 % (ref 36.0–46.0)
Hemoglobin: 13.8 g/dL (ref 12.0–15.0)
MCH: 34.6 pg — ABNORMAL HIGH (ref 26.0–34.0)
MCHC: 32.6 g/dL (ref 30.0–36.0)
MCV: 106 fL — ABNORMAL HIGH (ref 80.0–100.0)
Platelets: 216 K/uL (ref 150–400)
RBC: 3.99 MIL/uL (ref 3.87–5.11)
RDW: 12.4 % (ref 11.5–15.5)
WBC: 11.3 K/uL — ABNORMAL HIGH (ref 4.0–10.5)
nRBC: 0 % (ref 0.0–0.2)

## 2024-01-11 LAB — BASIC METABOLIC PANEL WITH GFR
Anion gap: 10 (ref 5–15)
BUN: 34 mg/dL — ABNORMAL HIGH (ref 8–23)
CO2: 34 mmol/L — ABNORMAL HIGH (ref 22–32)
Calcium: 8.3 mg/dL — ABNORMAL LOW (ref 8.9–10.3)
Chloride: 97 mmol/L — ABNORMAL LOW (ref 98–111)
Creatinine, Ser: 1.14 mg/dL — ABNORMAL HIGH (ref 0.44–1.00)
GFR, Estimated: 46 mL/min — ABNORMAL LOW (ref >60)
Glucose, Bld: 121 mg/dL — ABNORMAL HIGH (ref 70–99)
Potassium: 3.8 mmol/L (ref 3.5–5.1)
Sodium: 141 mmol/L (ref 135–145)

## 2024-01-11 MED ORDER — FUROSEMIDE 20 MG PO TABS: 20.0000 mg | ORAL_TABLET | Freq: Every day | ORAL | Status: DC

## 2024-01-11 MED ORDER — IPRATROPIUM-ALBUTEROL 0.5-2.5 (3) MG/3ML IN SOLN
3.0000 mL | Freq: Two times a day (BID) | RESPIRATORY_TRACT | Status: DC
  Administered 2024-01-11 – 2024-01-12 (×2): 3 mL via RESPIRATORY_TRACT
  Filled 2024-01-11 (×2): qty 3

## 2024-01-11 NOTE — Progress Notes (Signed)
 PROGRESS NOTE    Morgan Jacobs  FMW:[id number] DOB: Feb 25, 1934 DOA: 01/09/2024 PCP: Montey Lot, PA-C      Brief Narrative:   Morgan Jacobs is a 89 y.o. female with medical history significant for HFrEF (EF 45 to 50%, 12/2022), pulmonary hypertension, A-fib on Eliquis , HTN, COPD, CAD s/p proximal LAD stent (2003), NASH,  TIA(07/2021), parotid mass noted in 12/2022, chronic back pain and hip pain with history of osteoporotic vertebral fracture being admitted with intractable back pain secondary to an acute T1 compression fracture, as well as a right lower lobe pneumonia seen on CT with new O2 requirement of 2 L. Patient was initially brought in by EMS with a several day history of worsening of her chronic right-sided low back pain causing her to stay in bed which is not normal for her.  Her decreased mobility was initially attributed to her burying her son recently.  Additionally, patient has had a cough for the past month, seeing her PCP on 2 occasions for which she was started on prednisone without improvement.  She has had no chest pain, lower extremity pain or swelling. In the ED afebrile, tachypneic to 22, normal pulse and blood pressure, O2 sat 88 on room air requiring 2 L to get to the mid to high 90s. Labs notable for WBC 12.5 with lactic acid 1.2.  BNP 954 Total bilirubin 1.5 UA unremarkable Respiratory viral panel not done EKG not done CT lumbar and thoracic spine showing acute compression fracture superior endplate of T11 without retropulsion and less than 10% vertebral body height loss with abnormal chronic findings CT T-spine also showed airspace opacity right lower lobe possibly asymmetric edema versus pneumonia as well as interstitial edema in the lungs with small bilateral pleural effusions Patient treated with a dose of IV Lasix  Started on Rocephin and azithromycin for community-acquired pneumonia The ED provider: Spoke with neurosurgeon, Dr. Penne Sharps who recommended no  TLSO brace due to possible pneumonia, acute intervention but will see in the morning.   Assessment & Plan:   Principal Problem:   Acute respiratory failure with hypoxia (HCC) Active Problems:   CAP (community acquired pneumonia)   Acute on chronic HFrEF (heart failure with reduced ejection fraction) (HCC)   COPD (chronic obstructive pulmonary disease) (HCC)   Non-traumatic compression fracture of T11 thoracic vertebra, initial encounter (HCC)   Intractable back pain   Ambulatory dysfunction   Obesity, Class III, BMI 40-49.9 (morbid obesity) (HCC)   Paroxysmal atrial fibrillation (HCC)   HTN (hypertension)   Stage 3a chronic kidney disease (CKD) (HCC)   History of osteoporotic pathological fracture   Fatty liver   History of TIA (transient ischemic attack)   Pulmonary hypertension (HCC)   CAD S/P proximal LAD stent (2003)   Acute on chronic congestive heart failure (HCC)   Thoracic compression fracture, closed, initial encounter (HCC)  # T11 compression fracture Nontraumatic. Seen by neurosurg, surgery deferred - patient considering tlso brace for comfort - pain control - pt/ot advising snf, toc consulted  # CHF with acute exacerbation Imaging consistent with pulm edema. Several rounds recent steroids, no wheeze. No fever or cough to suggest pna, does have mild leukocytosis. Covid/flu/rsv neg. TTE no sig changes. Rvp neg - will hold on additional abx and steroids for now - f/u rvp - resume home lasix  pending tomorrow's bmp - cont home metop    # Acute hypoxic respiratory failure 2/2 above, 89 in ER, resolved with 2 liters. Dyspneic on arrival. Resolved - monitor  #  CAD DES to LAD around 2003. No chest pain. No rwmas on tte - home apixaban , metop - will hold home asa  # COPD Do not think exacerbated as above  # A-fib Rate controlled - cont home metop, apixaban , diltiazem   # HTN controlled - home metop, dilt - hold home losartan for now  # CKD 3a At  baseline - monitor   DVT prophylaxis: apixaban  Code Status: dnr/dni, confirmed with daughter at bedside on 10/23 Family Communication: daughter sherry telephonically 10/24  Level of care: Telemetry Medical Status is: inpt     Consultants:  neurosurgery  Procedures: none  Antimicrobials:  See above    Subjective: Reports stable low back pain, had bm today, tolerating diet  Objective: Vitals:   01/11/24 0500 01/11/24 0841 01/11/24 1211 01/11/24 1540  BP:  115/77 99/60 96/62   Pulse:  82 81 83  Resp:  17 17 16   Temp:  97.9 F (36.6 C) 98 F (36.7 C) (!) 97.5 F (36.4 C)  TempSrc:      SpO2:  96% 90% 92%  Weight: 82.1 kg     Height:        Intake/Output Summary (Last 24 hours) at 01/11/2024 1709 Last data filed at 01/11/2024 1300 Gross per 24 hour  Intake 660 ml  Output 1400 ml  Net -740 ml   Filed Weights   01/09/24 1522 01/11/24 0500  Weight: 86.2 kg 82.1 kg    Examination:  General exam: Appears calm and comfortable  Respiratory system: bibasilar rales, normal wob, no wheeze Cardiovascular system: S1 & S2 heard, RRR. No JVD, murmurs, rubs, gallops or clicks. trace pedal edema. Gastrointestinal system: Abdomen is nondistended, soft and nontender. No organomegaly or masses felt. Normal bowel sounds heard. Central nervous system: Alert and oriented. No focal neurological deficits. Extremities: Symmetric 5 x 5 power. Skin: No rashes, lesions or ulcers Psychiatry: calm, mild confusion    Data Reviewed: I have personally reviewed following labs and imaging studies  CBC: Recent Labs  Lab 01/09/24 1527 01/10/24 0504 01/11/24 0534  WBC 12.5* 10.1 11.3*  NEUTROABS 10.1*  --   --   HGB 14.5 14.2 13.8  HCT 42.6 42.6 42.3  MCV 103.6* 104.2* 106.0*  PLT 202 191 216   Basic Metabolic Panel: Recent Labs  Lab 01/09/24 1527 01/10/24 0504 01/11/24 0534  NA 141 144 141  K 3.5 3.7 3.8  CL 107 102 97*  CO2 23 30 34*  GLUCOSE 105* 154* 121*  BUN 22  24* 34*  CREATININE 0.83 0.95 1.14*  CALCIUM  8.4* 8.3* 8.3*   GFR: Estimated Creatinine Clearance: 32.4 mL/min (A) (by C-G formula based on SCr of 1.14 mg/dL (H)). Liver Function Tests: Recent Labs  Lab 01/09/24 1527  AST 30  ALT 41  ALKPHOS 74  BILITOT 1.5*  PROT 6.5  ALBUMIN 3.1*   Recent Labs  Lab 01/09/24 1527  LIPASE 58*   No results for input(s): AMMONIA in the last 168 hours. Coagulation Profile: No results for input(s): INR, PROTIME in the last 168 hours. Cardiac Enzymes: No results for input(s): CKTOTAL, CKMB, CKMBINDEX, TROPONINI in the last 168 hours. BNP (last 3 results) No results for input(s): PROBNP in the last 8760 hours. HbA1C: No results for input(s): HGBA1C in the last 72 hours. CBG: No results for input(s): GLUCAP in the last 168 hours. Lipid Profile: No results for input(s): CHOL, HDL, LDLCALC, TRIG, CHOLHDL, LDLDIRECT in the last 72 hours. Thyroid  Function Tests: No results for input(s): TSH, T4TOTAL,  FREET4, T3FREE, THYROIDAB in the last 72 hours. Anemia Panel: No results for input(s): VITAMINB12, FOLATE, FERRITIN, TIBC, IRON, RETICCTPCT in the last 72 hours. Urine analysis:    Component Value Date/Time   COLORURINE YELLOW (A) 01/09/2024 1630   APPEARANCEUR CLEAR (A) 01/09/2024 1630   LABSPEC 1.013 01/09/2024 1630   PHURINE 6.0 01/09/2024 1630   GLUCOSEU NEGATIVE 01/09/2024 1630   HGBUR NEGATIVE 01/09/2024 1630   BILIRUBINUR NEGATIVE 01/09/2024 1630   KETONESUR NEGATIVE 01/09/2024 1630   PROTEINUR NEGATIVE 01/09/2024 1630   NITRITE NEGATIVE 01/09/2024 1630   LEUKOCYTESUR NEGATIVE 01/09/2024 1630   Sepsis Labs: @LABRCNTIP (procalcitonin:4,lacticidven:4)  ) Recent Results (from the past 240 hours)  Blood culture (single)     Status: None (Preliminary result)   Collection Time: 01/09/24  8:11 PM   Specimen: BLOOD  Result Value Ref Range Status   Specimen Description BLOOD BLOOD  RIGHT ARM  Final   Special Requests   Final    Blood Culture results may not be optimal due to an inadequate volume of blood received in culture bottles BOTTLES DRAWN AEROBIC AND ANAEROBIC   Culture   Final    NO GROWTH 2 DAYS Performed at [facility], [address] [place], [place] [address]    Report Status PENDING  Incomplete  Resp panel by RT-PCR (RSV, Flu A&B, Covid) Anterior Nasal Swab     Status: None   Collection Time: 01/09/24 11:17 PM   Specimen: Anterior Nasal Swab  Result Value Ref Range Status   SARS Coronavirus 2 by RT PCR NEGATIVE NEGATIVE Final    Comment: (NOTE) SARS-CoV-2 target nucleic acids are NOT DETECTED.  The SARS-CoV-2 RNA is generally detectable in upper respiratory specimens during the acute phase of infection. The lowest concentration of SARS-CoV-2 viral copies this assay can detect is 138 copies/mL. A negative result does not preclude SARS-Cov-2 infection and should not be used as the sole basis for treatment or other patient management decisions. A negative result may occur with  improper specimen collection/handling, submission of specimen other than nasopharyngeal swab, presence of viral mutation(s) within the areas targeted by this assay, and inadequate number of viral copies(<138 copies/mL). A negative result must be combined with clinical observations, patient history, and epidemiological information. The expected result is Negative.  Fact Sheet for Patients:  BloggerCourse.com  Fact Sheet for Healthcare Providers:  SeriousBroker.it  This test is no t yet approved or cleared by the [place]  FDA and  has been authorized for detection and/or diagnosis of SARS-CoV-2 by FDA under an Emergency Use Authorization (EUA). This EUA will remain  in effect (meaning this test can be used) for the duration of the COVID-19 declaration under Section 564(b)(1) of the Act, 21 U.S.C.section  360bbb-3(b)(1), unless the authorization is terminated  or revoked sooner.       Influenza A by PCR NEGATIVE NEGATIVE Final   Influenza B by PCR NEGATIVE NEGATIVE Final    Comment: (NOTE) The Xpert Xpress SARS-CoV-2/FLU/RSV plus assay is intended as an aid in the diagnosis of influenza from Nasopharyngeal swab specimens and should not be used as a sole basis for treatment. Nasal washings and aspirates are unacceptable for Xpert Xpress SARS-CoV-2/FLU/RSV testing.  Fact Sheet for Patients: BloggerCourse.com  Fact Sheet for Healthcare Providers: SeriousBroker.it  This test is not yet approved or cleared by the [place]  FDA and has been authorized for detection and/or diagnosis of SARS-CoV-2 by FDA under an Emergency Use Authorization (EUA). This EUA will remain in effect (meaning  this test can be used) for the duration of the COVID-19 declaration under Section 564(b)(1) of the Act, 21 U.S.C. section 360bbb-3(b)(1), unless the authorization is terminated or revoked.     Resp Syncytial Virus by PCR NEGATIVE NEGATIVE Final    Comment: (NOTE) Fact Sheet for Patients: BloggerCourse.com  Fact Sheet for Healthcare Providers: SeriousBroker.it  This test is not yet approved or cleared by the [place]  FDA and has been authorized for detection and/or diagnosis of SARS-CoV-2 by FDA under an Emergency Use Authorization (EUA). This EUA will remain in effect (meaning this test can be used) for the duration of the COVID-19 declaration under Section 564(b)(1) of the Act, 21 U.S.C. section 360bbb-3(b)(1), unless the authorization is terminated or revoked.  Performed at [facility], [address] [place], [place] [address]   Respiratory (~20 pathogens) panel by PCR     Status: None   Collection Time: 01/10/24 11:50 AM   Specimen: Nasopharyngeal Swab; Respiratory   Result Value Ref Range Status   Adenovirus NOT DETECTED NOT DETECTED Final   Coronavirus 229E NOT DETECTED NOT DETECTED Final    Comment: (NOTE) The Coronavirus on the Respiratory Panel, DOES NOT test for the novel  Coronavirus (2019 nCoV)    Coronavirus HKU1 NOT DETECTED NOT DETECTED Final   Coronavirus NL63 NOT DETECTED NOT DETECTED Final   Coronavirus OC43 NOT DETECTED NOT DETECTED Final   Metapneumovirus NOT DETECTED NOT DETECTED Final   Rhinovirus / Enterovirus NOT DETECTED NOT DETECTED Final   Influenza A NOT DETECTED NOT DETECTED Final   Influenza B NOT DETECTED NOT DETECTED Final   Parainfluenza Virus 1 NOT DETECTED NOT DETECTED Final   Parainfluenza Virus 2 NOT DETECTED NOT DETECTED Final   Parainfluenza Virus 3 NOT DETECTED NOT DETECTED Final   Parainfluenza Virus 4 NOT DETECTED NOT DETECTED Final   Respiratory Syncytial Virus NOT DETECTED NOT DETECTED Final   Bordetella pertussis NOT DETECTED NOT DETECTED Final   Bordetella Parapertussis NOT DETECTED NOT DETECTED Final   Chlamydophila pneumoniae NOT DETECTED NOT DETECTED Final   Mycoplasma pneumoniae NOT DETECTED NOT DETECTED Final    Comment: Performed at [facility], [address], [place], [place] [address]         Radiology Studies: ECHOCARDIOGRAM COMPLETE Result Date: 01/10/2024    ECHOCARDIOGRAM REPORT   Patient Name:   KINZE LABO Date of Exam: 01/10/2024 Medical Rec #:  [id number]   Height:       60.0 in Accession #:    [id number]  Weight:       190.0 lb Date of Birth:  [birth year]   BSA:          1.826 m Patient Age:    89 years    BP:           106/66 mmHg Patient Gender: F           HR:           97 bpm. Exam Location:  [facility] Procedure: 2D Echo, Cardiac Doppler and Color Doppler (Both Spectral and Color            Flow Doppler were utilized during procedure). Indications:     CHF  History:         Patient has prior history of Echocardiogram examinations, most                  recent 08/07/2021. CAD, COPD;  Risk Factors:Hypertension. CKD.  Sonographer:     Philomena Daring  Referring Phys:  [id number] DELAYNE LULLA SOLIAN Diagnosing Phys: Evalene Lunger MD IMPRESSIONS  1. Left ventricular ejection fraction, by estimation, is 50 to 55%. The left ventricle has low normal function. The left ventricle has no regional wall motion abnormalities. There is mild left ventricular hypertrophy. Left ventricular diastolic parameters are indeterminate.  2. Right ventricular systolic function is mildly reduced. The right ventricular size is normal. There is mildly elevated pulmonary artery systolic pressure. The estimated right ventricular systolic pressure is 38.6 mmHg.  3. Left atrial size was mildly dilated.  4. The mitral valve is normal in structure. Mild mitral valve regurgitation. No evidence of mitral stenosis. Moderate mitral annular calcification.  5. Tricuspid valve regurgitation is moderate.  6. The aortic valve is normal in structure. Aortic valve regurgitation is mild. Aortic valve sclerosis is present, with no evidence of aortic valve stenosis.  7. The inferior vena cava is normal in size with greater than 50% respiratory variability, suggesting right atrial pressure of 3 mmHg.  8. Rhythm concerning for atrial flibrillation. FINDINGS  Left Ventricle: Left ventricular ejection fraction, by estimation, is 50 to 55%. The left ventricle has low normal function. The left ventricle has no regional wall motion abnormalities. Strain was performed and the global longitudinal strain is indeterminate. The left ventricular internal cavity size was normal in size. There is mild left ventricular hypertrophy. Left ventricular diastolic parameters are indeterminate. Right Ventricle: The right ventricular size is normal. No increase in right ventricular wall thickness. Right ventricular systolic function is mildly reduced. There is mildly elevated pulmonary artery systolic pressure. The tricuspid regurgitant velocity  is 2.90 m/s, and with an  assumed right atrial pressure of 5 mmHg, the estimated right ventricular systolic pressure is 38.6 mmHg. Left Atrium: Left atrial size was mildly dilated. Right Atrium: Right atrial size was normal in size. Pericardium: There is no evidence of pericardial effusion. Mitral Valve: The mitral valve is normal in structure. Moderate mitral annular calcification. Mild mitral valve regurgitation. No evidence of mitral valve stenosis. Tricuspid Valve: The tricuspid valve is normal in structure. Tricuspid valve regurgitation is moderate . No evidence of tricuspid stenosis. Aortic Valve: The aortic valve is normal in structure. Aortic valve regurgitation is mild. Aortic regurgitation PHT measures 1022 msec. Aortic valve sclerosis is present, with no evidence of aortic valve stenosis. Pulmonic Valve: The pulmonic valve was normal in structure. Pulmonic valve regurgitation is not visualized. No evidence of pulmonic stenosis. Aorta: The aortic root is normal in size and structure. Venous: The inferior vena cava is normal in size with greater than 50% respiratory variability, suggesting right atrial pressure of 3 mmHg. IAS/Shunts: No atrial level shunt detected by color flow Doppler. Additional Comments: 3D was performed not requiring image post processing on an independent workstation and was indeterminate.  LEFT VENTRICLE PLAX 2D LVIDd:         4.10 cm   Diastology LVIDs:         2.70 cm   LV e' medial:    6.42 cm/s LV PW:         1.10 cm   LV E/e' medial:  15.5 LV IVS:        1.20 cm   LV e' lateral:   9.36 cm/s LVOT diam:     1.70 cm   LV E/e' lateral: 10.6 LV SV:         30 LV SV Index:   17 LVOT Area:     2.27 cm  RIGHT VENTRICLE  IVC RV S prime:     6.42 cm/s  IVC diam: 2.30 cm TAPSE (M-mode): 1.3 cm LEFT ATRIUM             Index        RIGHT ATRIUM           Index LA diam:        4.20 cm 2.30 cm/m   RA Area:     19.90 cm LA Vol (A2C):   51.0 ml 27.92 ml/m  RA Volume:   52.40 ml  28.69 ml/m LA Vol (A4C):    68.3 ml 37.40 ml/m LA Biplane Vol: 57.8 ml 31.65 ml/m  AORTIC VALVE LVOT Vmax:   71.90 cm/s LVOT Vmean:  51.100 cm/s LVOT VTI:    0.134 m AI PHT:      1022 msec  AORTA Ao Root diam: 2.60 cm MITRAL VALVE               TRICUSPID VALVE MV Area (PHT): 7.90 cm    TR Peak grad:   33.6 mmHg MV Decel Time: 96 msec     TR Vmax:        290.00 cm/s MV E velocity: 99.60 cm/s                            SHUNTS                            Systemic VTI:  0.13 m                            Systemic Diam: 1.70 cm Evalene Lunger MD Electronically signed by Evalene Lunger MD Signature Date/Time: 01/10/2024/1:07:53 PM    Final    CT Thoracic Spine Wo Contrast Result Date: 01/09/2024 EXAM: CT THORACIC AND LUMBAR SPINE WITHOUT INTRAVENOUS CONTRAST 01/09/2024 06:10:18 PM TECHNIQUE: CT of the thoracic and lumbar spine was performed without the administration of intravenous contrast. Multiplanar reformatted images are provided for review. Automated exposure control, iterative reconstruction, and/or weight based adjustment of the mA/kV was utilized to reduce the radiation dose to as low as reasonably achievable. Incidental adrenal and/or renal findings do not require follow up imaging. COMPARISON: Lumbar spine radiographs 10/11/2022. CLINICAL HISTORY: Mid-back pain, prior compression fracture. Chronic low right sided back pain. Hx DJD, arthritis. C/O last couple of days increasing pain with standing. Recently started prednisone for back pain, not effective. FINDINGS: THORACIC SPINE: BONES AND ALIGNMENT: Suspected compression fracture of the superior endplate of T11 with an acute appearance. Less than 10% T11 vertebral body height loss. No retropulsion. Normal alignment. DEGENERATIVE CHANGES: Mild spondylosis. No severe thoracic spinal canal narrowing. SOFT TISSUES: Interlobular septal thickening in the lungs. Hazy airspace opacity in the right lower lobe. Small right greater than left pleural effusions. LUMBAR SPINE: BONES AND ALIGNMENT:  Anterior compression fracture of L4 has increased from 10/11/2022. Additional compression fracture of L5 is new since 2024. 60% vertebral body height loss of L4 anteriorly. 25% vertebral body height loss of L5. 4 mm of retropulsion of the superior endplate of L4 and 4 mm of retropulsion of the superior endplate of L5. These are age indeterminate without recent comparison but favored chronic. Grade 1 retrolisthesis of L2. This is unchanged from prior. DEGENERATIVE CHANGES: Multilevel spondylosis, disc space height loss, vacuum phenomenon, and degenerative endplate changes. These are greatest at L2-L3 where  they are moderate. Moderate to advanced facet arthropathy in the lower lumbar spine. Spinal canal narrowing is greatest at L3-L4 where it is severe secondary to posterior disc bulge, retropulsion of the superior endplate of L4 (4 mm), ligamentum flavum hypertrophy, and facet arthropathy. The disc bulge at L3-L4 causes severe narrowing of the bilateral neural foramina. SOFT TISSUES: No acute abnormality. IMPRESSION: 1. Suspected acute compression fracture of the superior endplate of T11 with less than 10% vertebral body height loss. No retropulsion. 2. Presumed chronic compression fractures of L4 and L5 as described. 3. Severe spinal canal and bilateral neural foraminal narrowing at L3 - L4. 4. Hazy airspace opacity in the right lower lobe may be asymmetric edema or pneumonia. 5. Interstitial edema in the lungs with small bilateral pleural effusions. Electronically signed by: Norman Gatlin MD 01/09/2024 06:30 PM EDT RP Workstation: [id number]   CT Lumbar Spine Wo Contrast Result Date: 01/09/2024 EXAM: CT THORACIC AND LUMBAR SPINE WITHOUT INTRAVENOUS CONTRAST 01/09/2024 06:10:18 PM TECHNIQUE: CT of the thoracic and lumbar spine was performed without the administration of intravenous contrast. Multiplanar reformatted images are provided for review. Automated exposure control, iterative reconstruction, and/or  weight based adjustment of the mA/kV was utilized to reduce the radiation dose to as low as reasonably achievable. Incidental adrenal and/or renal findings do not require follow up imaging. COMPARISON: Lumbar spine radiographs 10/11/2022. CLINICAL HISTORY: Mid-back pain, prior compression fracture. Chronic low right sided back pain. Hx DJD, arthritis. C/O last couple of days increasing pain with standing. Recently started prednisone for back pain, not effective. FINDINGS: THORACIC SPINE: BONES AND ALIGNMENT: Suspected compression fracture of the superior endplate of T11 with an acute appearance. Less than 10% T11 vertebral body height loss. No retropulsion. Normal alignment. DEGENERATIVE CHANGES: Mild spondylosis. No severe thoracic spinal canal narrowing. SOFT TISSUES: Interlobular septal thickening in the lungs. Hazy airspace opacity in the right lower lobe. Small right greater than left pleural effusions. LUMBAR SPINE: BONES AND ALIGNMENT: Anterior compression fracture of L4 has increased from 10/11/2022. Additional compression fracture of L5 is new since 2024. 60% vertebral body height loss of L4 anteriorly. 25% vertebral body height loss of L5. 4 mm of retropulsion of the superior endplate of L4 and 4 mm of retropulsion of the superior endplate of L5. These are age indeterminate without recent comparison but favored chronic. Grade 1 retrolisthesis of L2. This is unchanged from prior. DEGENERATIVE CHANGES: Multilevel spondylosis, disc space height loss, vacuum phenomenon, and degenerative endplate changes. These are greatest at L2-L3 where they are moderate. Moderate to advanced facet arthropathy in the lower lumbar spine. Spinal canal narrowing is greatest at L3-L4 where it is severe secondary to posterior disc bulge, retropulsion of the superior endplate of L4 (4 mm), ligamentum flavum hypertrophy, and facet arthropathy. The disc bulge at L3-L4 causes severe narrowing of the bilateral neural foramina. SOFT  TISSUES: No acute abnormality. IMPRESSION: 1. Suspected acute compression fracture of the superior endplate of T11 with less than 10% vertebral body height loss. No retropulsion. 2. Presumed chronic compression fractures of L4 and L5 as described. 3. Severe spinal canal and bilateral neural foraminal narrowing at L3 - L4. 4. Hazy airspace opacity in the right lower lobe may be asymmetric edema or pneumonia. 5. Interstitial edema in the lungs with small bilateral pleural effusions. Electronically signed by: Norman Gatlin MD 01/09/2024 06:30 PM EDT RP Workstation: [id number]        Scheduled Meds:  apixaban   5 mg Oral BID   diltiazem   180 mg  Oral Daily   furosemide   40 mg Intravenous BID   guaiFENesin  600 mg Oral BID   ipratropium-albuterol  3 mL Nebulization BID   metoprolol  succinate  50 mg Oral Daily   polyethylene glycol  34 g Oral Daily   Continuous Infusions:   LOS: 1 day     Devaughn KATHEE Ban, MD [facility]   If 7PM-7AM, please contact night-coverage [email or web address] Password [email or web address] 01/11/2024, 5:09 PM

## 2024-01-11 NOTE — Plan of Care (Signed)
 A&Ox4, VSS on RA. Is able to pivot to [facility]. Pain 0/10 at rest but 8/10 on movement. Very independent. BM today. External cath in use, not incontinent. Lasix  given. Appetite good.   Problem: Education: Goal: Knowledge of General Education information will improve Description: Including pain rating scale, medication(s)/side effects and non-pharmacologic comfort measures Outcome: Progressing   Problem: Health Behavior/Discharge Planning: Goal: Ability to manage health-related needs will improve Outcome: Progressing   Problem: Clinical Measurements: Goal: Ability to maintain clinical measurements within normal limits will improve Outcome: Progressing Goal: Will remain free from infection Outcome: Progressing Goal: Diagnostic test results will improve Outcome: Progressing Goal: Respiratory complications will improve Outcome: Progressing Goal: Cardiovascular complication will be avoided Outcome: Progressing   Problem: Activity: Goal: Risk for activity intolerance will decrease Outcome: Progressing   Problem: Nutrition: Goal: Adequate nutrition will be maintained Outcome: Progressing   Problem: Coping: Goal: Level of anxiety will decrease Outcome: Progressing   Problem: Elimination: Goal: Will not experience complications related to bowel motility Outcome: Progressing Goal: Will not experience complications related to urinary retention Outcome: Progressing   Problem: Pain Managment: Goal: General experience of comfort will improve and/or be controlled Outcome: Progressing   Problem: Safety: Goal: Ability to remain free from injury will improve Outcome: Progressing   Problem: Skin Integrity: Goal: Risk for impaired skin integrity will decrease Outcome: Progressing   Problem: Education: Goal: Ability to demonstrate management of disease process will improve Outcome: Not Progressing Goal: Ability to verbalize understanding of medication therapies will improve Outcome:  Progressing Goal: Individualized Educational Video(s) Outcome: Not Applicable   Problem: Activity: Goal: Capacity to carry out activities will improve Outcome: Progressing   Problem: Cardiac: Goal: Ability to achieve and maintain adequate cardiopulmonary perfusion will improve Outcome: Progressing   Problem: Education: Goal: Knowledge of disease or condition will improve Outcome: Progressing Goal: Knowledge of the prescribed therapeutic regimen will improve Outcome: Progressing Goal: Individualized Educational Video(s) Outcome: Not Applicable   Problem: Activity: Goal: Ability to tolerate increased activity will improve Outcome: Progressing Goal: Will verbalize the importance of balancing activity with adequate rest periods Outcome: Progressing   Problem: Respiratory: Goal: Ability to maintain a clear airway will improve Outcome: Progressing Goal: Levels of oxygenation will improve Outcome: Progressing Goal: Ability to maintain adequate ventilation will improve Outcome: Progressing   Problem: Activity: Goal: Ability to tolerate increased activity will improve Outcome: Progressing   Problem: Clinical Measurements: Goal: Ability to maintain a body temperature in the normal range will improve Outcome: Progressing   Problem: Respiratory: Goal: Ability to maintain adequate ventilation will improve Outcome: Progressing Goal: Ability to maintain a clear airway will improve Outcome: Progressing

## 2024-01-11 NOTE — Progress Notes (Signed)
 Physical Therapy Treatment Patient Details Name: Morgan Jacobs MRN: [id number] DOB: 1933/12/09 Today's Date: 01/11/2024   History of Present Illness Pt is an 89 y/o F admitted on 01/09/24 after presenting with c/o intractable back pain 2/2 acute T1 compression fx. Also noted to have R lower lobe PNA with new O2 requirement. PMH: HFrEF, pulmonary HTN, a-fib on Eliquis , HTN, COPD, CAD s/p proximal LAD stent (2003), NASH, TIA (07/2021), parotid mass (12/2022), chronic back pain & hip pain, osteoporotic vertebral fx    PT Comments  Patient seen for PT session focused on OOB mobility. Patient required minA/CGA for transfers progressing to supervision and used RW to stand. Pt able to ambulate 80 feet with RW. Tolerated session well with no signs of exertion or distress. Vitals remained stable during activity. Main limiting factors today were fatigue. Interventions aimed at improving OOB mobility. Continued skilled PT recommended to progress toward functional goals and support discharge readiness.     If plan is discharge home, recommend the following: A little help with walking and/or transfers;A little help with bathing/dressing/bathroom;Assistance with cooking/housework;Assist for transportation;Help with stairs or ramp for entrance   Can travel by private vehicle     Yes  Equipment Recommendations  Other (comment) (defer to next venue)    Recommendations for Other Services       Precautions / Restrictions Precautions Precautions: Fall;Back Recall of Precautions/Restrictions: Intact Precaution/Restrictions Comments: per neurosurgery note:she may feel better with the TLSO brace as this could be supportive across the junction of her fracture pt currently declining Restrictions Weight Bearing Restrictions Per Provider Order: No     Mobility  Bed Mobility Overal bed mobility: Needs Assistance Bed Mobility: Rolling, Supine to Sit Rolling: Contact guard assist, Supervision   Supine to  sit: Min assist, Contact guard     General bed mobility comments: step by step vcs for technique    Transfers Overall transfer level: Needs assistance Equipment used: Rolling walker (2 wheels) Transfers: Sit to/from Stand Sit to Stand: Contact guard assist           General transfer comment: poor awareness re: hand placement & decreased ability to follow cuing for pushing to standing, pulls up on RW    Ambulation/Gait Ambulation/Gait assistance: Min assist, Contact guard assist Gait Distance (Feet): 80 Feet Assistive device: Rolling walker (2 wheels) Gait Pattern/deviations: Decreased step length - right, Decreased step length - left, Decreased stride length           Stairs             Wheelchair Mobility     Tilt Bed    Modified Rankin (Stroke Patients Only)       Balance Overall balance assessment: Needs assistance Sitting-balance support: Feet supported Sitting balance-Leahy Scale: Good     Standing balance support: Bilateral upper extremity supported, During functional activity, Reliant on assistive device for balance Standing balance-Leahy Scale: Good                              Communication Communication Communication: No apparent difficulties  Cognition Arousal: Alert Behavior During Therapy: WFL for tasks assessed/performed   PT - Cognitive impairments: Safety/Judgement, Awareness                         Following commands: Intact      Cueing Cueing Techniques: Verbal cues  Exercises      General Comments  Pertinent Vitals/Pain Pain Assessment Pain Assessment: Faces Faces Pain Scale: Hurts little more Pain Location: back with mobility Pain Descriptors / Indicators: Discomfort, Crying, Grimacing Pain Intervention(s): Monitored during session, Repositioned, Premedicated before session    Home Living                          Prior Function            PT Goals (current goals can  now be found in the care plan section) Acute Rehab PT Goals Patient Stated Goal: decreased pain, go home PT Goal Formulation: With patient Time For Goal Achievement: 01/24/24 Potential to Achieve Goals: Good Progress towards PT goals: Progressing toward goals    Frequency    Min 2X/week      PT Plan      Co-evaluation              AM-PAC PT 6 Clicks Mobility   Outcome Measure  Help needed turning from your back to your side while in a flat bed without using bedrails?: A Little Help needed moving from lying on your back to sitting on the side of a flat bed without using bedrails?: A Lot Help needed moving to and from a bed to a chair (including a wheelchair)?: A Little Help needed standing up from a chair using your arms (e.g., wheelchair or bedside chair)?: A Little Help needed to walk in hospital room?: A Little Help needed climbing 3-5 steps with a railing? : A Lot 6 Click Score: 16    End of Session   Activity Tolerance: Patient tolerated treatment well;Patient limited by pain Patient left: in chair;with chair alarm set;with call bell/phone within reach;with family/visitor present Nurse Communication: Mobility status PT Visit Diagnosis: Pain;Muscle weakness (generalized) (M62.81);Unsteadiness on feet (R26.81) Pain - part of body:  (lower back)     Time: 8442-8390 PT Time Calculation (min) (ACUTE ONLY): 12 min  Charges:    $Therapeutic Activity: 8-22 mins PT General Charges $$ ACUTE PT VISIT: 1 Visit                     Sherlean Lesches DPT, PT    [name] 01/11/2024, 4:15 PM

## 2024-01-11 NOTE — TOC Initial Note (Signed)
 Transition of Care [facility]) - Initial/Assessment Note    Patient Details  Name: Morgan Jacobs MRN: [id number] Date of Birth: 06-Jun-1933  [facility]) CM/SW Contact:    Alvaro Louder, LCSW Phone Number: 01/11/2024, 2:00 PM  Clinical Narrative:    Per Chart review patient from home. PCP is Rankin Dike LCSWA Faxed out information to SNF's in [place]. LCSWA will present Facilities to patient at the bedside.              TOC to follow for discharge       Patient Goals and CMS Choice            Expected Discharge Plan and Services                                              Prior Living Arrangements/Services                       Activities of Daily Living   ADL Screening (condition at time of admission) Independently performs ADLs?: Yes (appropriate for developmental age) Is the patient deaf or have difficulty hearing?: No Does the patient have difficulty seeing, even when wearing glasses/contacts?: No Does the patient have difficulty concentrating, remembering, or making decisions?: No  Permission Sought/Granted                  Emotional Assessment              Admission diagnosis:  CAP (community acquired pneumonia) [J18.9] Acute respiratory failure with hypoxia (HCC) [J96.01] Thoracic compression fracture, closed, initial encounter (HCC) [S22.000A] Community acquired pneumonia of right lower lobe of lung [J18.9] Acute on chronic congestive heart failure, unspecified heart failure type (HCC) [I50.9] Patient Active Problem List   Diagnosis Date Noted   Acute on chronic congestive heart failure (HCC) 01/10/2024   Thoracic compression fracture, closed, initial encounter (HCC) 01/10/2024   CAP (community acquired pneumonia) 01/09/2024   Non-traumatic compression fracture of T11 thoracic vertebra, initial encounter (HCC) 01/09/2024   History of osteoporotic pathological fracture 01/09/2024   Acute respiratory failure  with hypoxia (HCC) 01/09/2024   Intractable back pain 01/09/2024   History of TIA (transient ischemic attack) 01/09/2024   Ambulatory dysfunction 01/09/2024   Pulmonary hypertension (HCC) 01/09/2024   Acute on chronic HFrEF (heart failure with reduced ejection fraction) (HCC) 01/09/2024   Obesity, Class III, BMI 40-49.9 (morbid obesity) (HCC) 01/09/2024   Fatty liver    COPD (chronic obstructive pulmonary disease) (HCC)    CAD S/P proximal LAD stent (2003) 01/15/2023   TIA (transient ischemic attack) 08/07/2021   Parotid mass 08/07/2021   Stage 3a chronic kidney disease (CKD) (HCC) 08/07/2021   HTN (hypertension)    Paroxysmal atrial fibrillation (HCC)    Hypokalemia    PCP:  Dike Rankin, PA-C Pharmacy:   [facility] [phone] - [place], [place] - [address] [place] [address] Phone: [phone] Fax: [phone]     Social Drivers of Health (SDOH) Social History: SDOH Screenings   Food Insecurity: No Food Insecurity (01/10/2024)  Housing: Unknown (01/10/2024)  Transportation Needs: No Transportation Needs (01/09/2024)  Utilities: Not At Risk (01/09/2024)  Financial Resource Strain: Low Risk  (01/10/2024)  Tobacco Use: High Risk (01/10/2024)   SDOH Interventions: Food Insecurity Interventions: Intervention Not Indicated Housing Interventions: Intervention Not Indicated  Financial Strain Interventions: Intervention Not Indicated   Readmission Risk Interventions     No data to display

## 2024-01-11 NOTE — NC FL2 (Signed)
 [place]  MEDICAID FL2 LEVEL OF CARE FORM     IDENTIFICATION  Patient Name: Morgan Jacobs Birthdate: December 16, 1933 Sex: female Admission Date (Current Location): 01/09/2024  [place] and [place] Number:  Chiropodist and Address:  [facility], [address], [place], [place] [address]      Provider Number: [id number]  Attending Physician Name and Address:  Kandis Devaughn Sayres, MD  Relative Name and Phone Number:       Current Level of Care: Hospital Recommended Level of Care: Skilled Nursing Facility Prior Approval Number:    Date Approved/Denied:   PASRR Number: [id number]  Discharge Plan: SNF    Current Diagnoses: Patient Active Problem List   Diagnosis Date Noted   Acute on chronic congestive heart failure (HCC) 01/10/2024   Thoracic compression fracture, closed, initial encounter (HCC) 01/10/2024   CAP (community acquired pneumonia) 01/09/2024   Non-traumatic compression fracture of T11 thoracic vertebra, initial encounter (HCC) 01/09/2024   History of osteoporotic pathological fracture 01/09/2024   Acute respiratory failure with hypoxia (HCC) 01/09/2024   Intractable back pain 01/09/2024   History of TIA (transient ischemic attack) 01/09/2024   Ambulatory dysfunction 01/09/2024   Pulmonary hypertension (HCC) 01/09/2024   Acute on chronic HFrEF (heart failure with reduced ejection fraction) (HCC) 01/09/2024   Obesity, Class III, BMI 40-49.9 (morbid obesity) (HCC) 01/09/2024   Fatty liver    COPD (chronic obstructive pulmonary disease) (HCC)    CAD S/P proximal LAD stent (2003) 01/15/2023   TIA (transient ischemic attack) 08/07/2021   Parotid mass 08/07/2021   Stage 3a chronic kidney disease (CKD) (HCC) 08/07/2021   HTN (hypertension)    Paroxysmal atrial fibrillation (HCC)    Hypokalemia     Orientation RESPIRATION BLADDER Height & Weight     Self, Time, Situation, Place  O2 (2 L) Incontinent Weight: 181 lb (82.1 kg) Height:   5' (152.4 cm)  BEHAVIORAL SYMPTOMS/MOOD NEUROLOGICAL BOWEL NUTRITION STATUS      Continent Diet (Heart Room)  AMBULATORY STATUS COMMUNICATION OF NEEDS Skin   Limited Assist Verbally Normal                       Personal Care Assistance Level of Assistance  Bathing, Dressing, Feeding Bathing Assistance: Limited assistance Feeding assistance: Independent Dressing Assistance: Limited assistance     Functional Limitations Info  Sight, Hearing, Speech Sight Info: Adequate Hearing Info: Adequate Speech Info: Adequate    SPECIAL CARE FACTORS FREQUENCY  PT (By licensed PT), OT (By licensed OT)     PT Frequency: 5x/week OT Frequency: 5x/week            Contractures      Additional Factors Info  Code Status, Allergies Code Status Info: Full Allergies Info: NKA           Current Medications (01/11/2024):  This is the current hospital active medication list Current Facility-Administered Medications  Medication Dose Route Frequency Provider Last Rate Last Admin   acetaminophen  (TYLENOL ) tablet 650 mg  650 mg Oral Q6H PRN Duncan, Hazel V, MD   650 mg at 01/09/24 2351   Or   acetaminophen  (TYLENOL ) suppository 650 mg  650 mg Rectal Q6H PRN Duncan, Hazel V, MD       albuterol (PROVENTIL) (2.5 MG/3ML) 0.083% nebulizer solution 2.5 mg  2.5 mg Nebulization Q2H PRN Duncan, Hazel V, MD       apixaban  (ELIQUIS ) tablet 5 mg  5 mg Oral BID Cleatus Delayne GAILS, MD  5 mg at 01/11/24 0911   diltiazem  (CARDIZEM  CD) 24 hr capsule 180 mg  180 mg Oral Daily Duncan, Hazel V, MD   180 mg at 01/11/24 0911   furosemide  (LASIX ) injection 40 mg  40 mg Intravenous BID Duncan, Hazel V, MD   40 mg at 01/11/24 0911   guaiFENesin (MUCINEX) 12 hr tablet 600 mg  600 mg Oral BID Duncan, Hazel V, MD   600 mg at 01/11/24 0912   ipratropium-albuterol (DUONEB) 0.5-2.5 (3) MG/3ML nebulizer solution 3 mL  3 mL Nebulization TID Cleatus Delayne GAILS, MD   3 mL at 01/11/24 0839   methocarbamol (ROBAXIN) injection 500  mg  500 mg Intravenous Q6H PRN Duncan, Hazel V, MD   500 mg at 01/09/24 2355   metoprolol  succinate (TOPROL -XL) 24 hr tablet 50 mg  50 mg Oral Daily Kandis Devaughn Sayres, MD   50 mg at 01/11/24 0911   ondansetron (ZOFRAN) tablet 4 mg  4 mg Oral Q6H PRN Duncan, Hazel V, MD       Or   ondansetron (ZOFRAN) injection 4 mg  4 mg Intravenous Q6H PRN Duncan, Hazel V, MD       Oral care mouth rinse  15 mL Mouth Rinse PRN Cleatus Delayne GAILS, MD       oxyCODONE (Oxy IR/ROXICODONE) immediate release tablet 5 mg  5 mg Oral Q4H PRN Duncan, Hazel V, MD   5 mg at 01/10/24 1845   polyethylene glycol (MIRALAX / GLYCOLAX) packet 34 g  34 g Oral Daily Kandis Devaughn Sayres, MD   34 g at 01/11/24 0911     Discharge Medications: Please see discharge summary for a list of discharge medications.  Relevant Imaging Results:  Relevant Lab Results:   Additional Information SSN: [id number]  Alvaro Louder, LCSW

## 2024-01-12 DIAGNOSIS — J9601 Acute respiratory failure with hypoxia: Secondary | ICD-10-CM | POA: Diagnosis not present

## 2024-01-12 LAB — CBC
HCT: 42.1 % (ref 36.0–46.0)
Hemoglobin: 14 g/dL (ref 12.0–15.0)
MCH: 34.8 pg — ABNORMAL HIGH (ref 26.0–34.0)
MCHC: 33.3 g/dL (ref 30.0–36.0)
MCV: 104.7 fL — ABNORMAL HIGH (ref 80.0–100.0)
Platelets: 226 K/uL (ref 150–400)
RBC: 4.02 MIL/uL (ref 3.87–5.11)
RDW: 12.2 % (ref 11.5–15.5)
WBC: 10.1 K/uL (ref 4.0–10.5)
nRBC: 0 % (ref 0.0–0.2)

## 2024-01-12 LAB — BASIC METABOLIC PANEL WITH GFR
Anion gap: 10 (ref 5–15)
BUN: 35 mg/dL — ABNORMAL HIGH (ref 8–23)
CO2: 33 mmol/L — ABNORMAL HIGH (ref 22–32)
Calcium: 8.2 mg/dL — ABNORMAL LOW (ref 8.9–10.3)
Chloride: 97 mmol/L — ABNORMAL LOW (ref 98–111)
Creatinine, Ser: 1.13 mg/dL — ABNORMAL HIGH (ref 0.44–1.00)
GFR, Estimated: 47 mL/min — ABNORMAL LOW (ref >60)
Glucose, Bld: 99 mg/dL (ref 70–99)
Potassium: 3.7 mmol/L (ref 3.5–5.1)
Sodium: 140 mmol/L (ref 135–145)

## 2024-01-12 MED ORDER — FUROSEMIDE 20 MG PO TABS: 20.0000 mg | ORAL_TABLET | Freq: Every day | ORAL | Status: DC

## 2024-01-12 MED ORDER — FUROSEMIDE 20 MG PO TABS
20.0000 mg | ORAL_TABLET | Freq: Every day | ORAL | Status: DC
  Administered 2024-01-13 – 2024-01-18 (×6): 20 mg via ORAL
  Filled 2024-01-12 (×6): qty 1

## 2024-01-12 MED ORDER — IPRATROPIUM-ALBUTEROL 0.5-2.5 (3) MG/3ML IN SOLN: 3.0000 mL | Freq: Four times a day (QID) | RESPIRATORY_TRACT | Status: DC | PRN

## 2024-01-12 NOTE — TOC Progression Note (Signed)
 Transition of Care [facility]) - Progression Note    Patient Details  Name: Morgan Jacobs MRN: [id number] Date of Birth: Mar 03, 1934  Transition of Care [facility]) CM/SW Contact  Rami Budhu L Lorita [place]in[place], [place] Phone Number: 01/12/2024, 3:29 PM  Clinical Narrative:     CSW met with patient, daughter was in the room. Bed offers reviewed. Patient received one bed offer to [facility]. Daughter did not want to accept the bed offer. Daughter asked that bed search is extended to [place], [facility] and [facility].   Patient does not want to go to SNF. Daughter seems agreeable to taking patient home if patient does not receive any other bed offers.                     Expected Discharge Plan and Services                                               Social Drivers of Health (SDOH) Interventions SDOH Screenings   Food Insecurity: No Food Insecurity (01/10/2024)  Housing: Unknown (01/10/2024)  Transportation Needs: No Transportation Needs (01/09/2024)  Utilities: Not At Risk (01/09/2024)  Financial Resource Strain: Low Risk  (01/10/2024)  Tobacco Use: High Risk (01/10/2024)    Readmission Risk Interventions     No data to display

## 2024-01-12 NOTE — Progress Notes (Signed)
 PROGRESS NOTE    Morgan Jacobs  FMW:[id number] DOB: 07-17-1933 DOA: 01/09/2024 PCP: Montey Lot, PA-C      Brief Narrative:   Morgan Jacobs is a 89 y.o. female with medical history significant for HFrEF (EF 45 to 50%, 12/2022), pulmonary hypertension, A-fib on Eliquis , HTN, COPD, CAD s/p proximal LAD stent (2003), NASH,  TIA(07/2021), parotid mass noted in 12/2022, chronic back pain and hip pain with history of osteoporotic vertebral fracture being admitted with intractable back pain secondary to an acute T1 compression fracture, as well as a right lower lobe pneumonia seen on CT with new O2 requirement of 2 L. Patient was initially brought in by EMS with a several day history of worsening of her chronic right-sided low back pain causing her to stay in bed which is not normal for her.  Her decreased mobility was initially attributed to her burying her son recently.  Additionally, patient has had a cough for the past month, seeing her PCP on 2 occasions for which she was started on prednisone without improvement.  She has had no chest pain, lower extremity pain or swelling. In the ED afebrile, tachypneic to 22, normal pulse and blood pressure, O2 sat 88 on room air requiring 2 L to get to the mid to high 90s. Labs notable for WBC 12.5 with lactic acid 1.2.  BNP 954 Total bilirubin 1.5 UA unremarkable Respiratory viral panel not done EKG not done CT lumbar and thoracic spine showing acute compression fracture superior endplate of T11 without retropulsion and less than 10% vertebral body height loss with abnormal chronic findings CT T-spine also showed airspace opacity right lower lobe possibly asymmetric edema versus pneumonia as well as interstitial edema in the lungs with small bilateral pleural effusions Patient treated with a dose of IV Lasix  Started on Rocephin and azithromycin for community-acquired pneumonia The ED provider: Spoke with neurosurgeon, Dr. Penne Sharps who recommended no  TLSO brace due to possible pneumonia, acute intervention but will see in the morning.   Assessment & Plan:   Principal Problem:   Acute respiratory failure with hypoxia (HCC) Active Problems:   CAP (community acquired pneumonia)   Acute on chronic HFrEF (heart failure with reduced ejection fraction) (HCC)   COPD (chronic obstructive pulmonary disease) (HCC)   Non-traumatic compression fracture of T11 thoracic vertebra, initial encounter (HCC)   Intractable back pain   Ambulatory dysfunction   Obesity, Class III, BMI 40-49.9 (morbid obesity) (HCC)   Paroxysmal atrial fibrillation (HCC)   HTN (hypertension)   Stage 3a chronic kidney disease (CKD) (HCC)   History of osteoporotic pathological fracture   Fatty liver   History of TIA (transient ischemic attack)   Pulmonary hypertension (HCC)   CAD S/P proximal LAD stent (2003)   Acute on chronic congestive heart failure (HCC)   Thoracic compression fracture, closed, initial encounter (HCC)  # T11 compression fracture Nontraumatic. Seen by neurosurg, surgery deferred - patient considering tlso brace for comfort - pain control - pt/ot advising snf, toc consulted, patient/daughter will only accept a bed close to home, are aware if bed offers elsewhere and no offers close to home will either need to accept those or plan to d/c home  # CHF with acute exacerbation Imaging consistent with pulm edema. Several rounds recent steroids, no wheeze. No fever or cough to suggest pna, does have mild leukocytosis. Covid/flu/rsv neg. TTE no sig changes. Rvp neg - will hold on additional abx and steroids for now - resume home lasix  - cont  home metop    # Acute hypoxic respiratory failure 2/2 above, 89 in ER, resolved with 2 liters. Dyspneic on arrival. Resolved - monitor  # CAD DES to LAD around 2003. No chest pain. No rwmas on tte - home apixaban , metop - will hold home asa  # COPD Do not think exacerbated as above  # A-fib Rate  controlled - cont home metop, apixaban , diltiazem   # HTN controlled - home metop, dilt - hold home losartan for now  # CKD 3a At baseline - monitor   DVT prophylaxis: apixaban  Code Status: dnr/dni, confirmed with daughter at bedside on 10/23 Family Communication: daughter at bedside 10/25  Level of care: Telemetry Medical Status is: inpt     Consultants:  neurosurgery  Procedures: none  Antimicrobials:  See above    Subjective: Reports stable low back pain, tolerating diet  Objective: Vitals:   01/12/24 0353 01/12/24 0712 01/12/24 0738 01/12/24 0747  BP: 103/68  123/63   Pulse: 66  78   Resp: 18  16   Temp: 98.1 F (36.7 C)  97.9 F (36.6 C)   TempSrc:      SpO2: 93%  93% 91%  Weight:  77.4 kg    Height:        Intake/Output Summary (Last 24 hours) at 01/12/2024 1527 Last data filed at 01/12/2024 0500 Gross per 24 hour  Intake --  Output 400 ml  Net -400 ml   Filed Weights   01/09/24 1522 01/11/24 0500 01/12/24 0712  Weight: 86.2 kg 82.1 kg 77.4 kg    Examination:  General exam: Appears calm and comfortable  Respiratory system: bibasilar rales, normal wob, no wheeze Cardiovascular system: S1 & S2 heard, RRR. No JVD, murmurs, rubs, gallops or clicks. trace pedal edema. Gastrointestinal system: Abdomen is nondistended, soft and nontender. No organomegaly or masses felt. Normal bowel sounds heard. Central nervous system: Alert and oriented. No focal neurological deficits. Extremities: Symmetric 5 x 5 power. Skin: No rashes, lesions or ulcers Psychiatry: calm, mild confusion    Data Reviewed: I have personally reviewed following labs and imaging studies  CBC: Recent Labs  Lab 01/09/24 1527 01/10/24 0504 01/11/24 0534 01/12/24 0418  WBC 12.5* 10.1 11.3* 10.1  NEUTROABS 10.1*  --   --   --   HGB 14.5 14.2 13.8 14.0  HCT 42.6 42.6 42.3 42.1  MCV 103.6* 104.2* 106.0* 104.7*  PLT 202 191 216 226   Basic Metabolic Panel: Recent Labs   Lab 01/09/24 1527 01/10/24 0504 01/11/24 0534 01/12/24 0418  NA 141 144 141 140  K 3.5 3.7 3.8 3.7  CL 107 102 97* 97*  CO2 23 30 34* 33*  GLUCOSE 105* 154* 121* 99  BUN 22 24* 34* 35*  CREATININE 0.83 0.95 1.14* 1.13*  CALCIUM  8.4* 8.3* 8.3* 8.2*   GFR: Estimated Creatinine Clearance: 31.7 mL/min (A) (by C-G formula based on SCr of 1.13 mg/dL (H)). Liver Function Tests: Recent Labs  Lab 01/09/24 1527  AST 30  ALT 41  ALKPHOS 74  BILITOT 1.5*  PROT 6.5  ALBUMIN 3.1*   Recent Labs  Lab 01/09/24 1527  LIPASE 58*   No results for input(s): AMMONIA in the last 168 hours. Coagulation Profile: No results for input(s): INR, PROTIME in the last 168 hours. Cardiac Enzymes: No results for input(s): CKTOTAL, CKMB, CKMBINDEX, TROPONINI in the last 168 hours. BNP (last 3 results) No results for input(s): PROBNP in the last 8760 hours. HbA1C: No results for input(s):  HGBA1C in the last 72 hours. CBG: No results for input(s): GLUCAP in the last 168 hours. Lipid Profile: No results for input(s): CHOL, HDL, LDLCALC, TRIG, CHOLHDL, LDLDIRECT in the last 72 hours. Thyroid  Function Tests: No results for input(s): TSH, T4TOTAL, FREET4, T3FREE, THYROIDAB in the last 72 hours. Anemia Panel: No results for input(s): VITAMINB12, FOLATE, FERRITIN, TIBC, IRON, RETICCTPCT in the last 72 hours. Urine analysis:    Component Value Date/Time   COLORURINE YELLOW (A) 01/09/2024 1630   APPEARANCEUR CLEAR (A) 01/09/2024 1630   LABSPEC 1.013 01/09/2024 1630   PHURINE 6.0 01/09/2024 1630   GLUCOSEU NEGATIVE 01/09/2024 1630   HGBUR NEGATIVE 01/09/2024 1630   BILIRUBINUR NEGATIVE 01/09/2024 1630   KETONESUR NEGATIVE 01/09/2024 1630   PROTEINUR NEGATIVE 01/09/2024 1630   NITRITE NEGATIVE 01/09/2024 1630   LEUKOCYTESUR NEGATIVE 01/09/2024 1630   Sepsis Labs: @LABRCNTIP (procalcitonin:4,lacticidven:4)  ) Recent Results (from the past 240  hours)  Blood culture (single)     Status: None (Preliminary result)   Collection Time: 01/09/24  8:11 PM   Specimen: BLOOD  Result Value Ref Range Status   Specimen Description BLOOD BLOOD RIGHT ARM  Final   Special Requests   Final    Blood Culture results may not be optimal due to an inadequate volume of blood received in culture bottles BOTTLES DRAWN AEROBIC AND ANAEROBIC   Culture   Final    NO GROWTH 3 DAYS Performed at [facility], [address], [place], [place] [address]    Report Status PENDING  Incomplete  Resp panel by RT-PCR (RSV, Flu A&B, Covid) Anterior Nasal Swab     Status: None   Collection Time: 01/09/24 11:17 PM   Specimen: Anterior Nasal Swab  Result Value Ref Range Status   SARS Coronavirus 2 by RT PCR NEGATIVE NEGATIVE Final    Comment: (NOTE) SARS-CoV-2 target nucleic acids are NOT DETECTED.  The SARS-CoV-2 RNA is generally detectable in upper respiratory specimens during the acute phase of infection. The lowest concentration of SARS-CoV-2 viral copies this assay can detect is 138 copies/mL. A negative result does not preclude SARS-Cov-2 infection and should not be used as the sole basis for treatment or other patient management decisions. A negative result may occur with  improper specimen collection/handling, submission of specimen other than nasopharyngeal swab, presence of viral mutation(s) within the areas targeted by this assay, and inadequate number of viral copies(<138 copies/mL). A negative result must be combined with clinical observations, patient history, and epidemiological information. The expected result is Negative.  Fact Sheet for Patients:  bloggercourse.com  Fact Sheet for Healthcare Providers:  seriousbroker.it  This test is no t yet approved or cleared by the [place]  FDA and  has been authorized for detection and/or diagnosis of SARS-CoV-2 by FDA under an Emergency  Use Authorization (EUA). This EUA will remain  in effect (meaning this test can be used) for the duration of the COVID-19 declaration under Section 564(b)(1) of the Act, 21 U.S.C.section 360bbb-3(b)(1), unless the authorization is terminated  or revoked sooner.       Influenza A by PCR NEGATIVE NEGATIVE Final   Influenza B by PCR NEGATIVE NEGATIVE Final    Comment: (NOTE) The Xpert Xpress SARS-CoV-2/FLU/RSV plus assay is intended as an aid in the diagnosis of influenza from Nasopharyngeal swab specimens and should not be used as a sole basis for treatment. Nasal washings and aspirates are unacceptable for Xpert Xpress SARS-CoV-2/FLU/RSV testing.  Fact Sheet for Patients: bloggercourse.com  Fact Sheet  for Healthcare Providers: seriousbroker.it  This test is not yet approved or cleared by the [place]  FDA and has been authorized for detection and/or diagnosis of SARS-CoV-2 by FDA under an Emergency Use Authorization (EUA). This EUA will remain in effect (meaning this test can be used) for the duration of the COVID-19 declaration under Section 564(b)(1) of the Act, 21 U.S.C. section 360bbb-3(b)(1), unless the authorization is terminated or revoked.     Resp Syncytial Virus by PCR NEGATIVE NEGATIVE Final    Comment: (NOTE) Fact Sheet for Patients: bloggercourse.com  Fact Sheet for Healthcare Providers: seriousbroker.it  This test is not yet approved or cleared by the [place]  FDA and has been authorized for detection and/or diagnosis of SARS-CoV-2 by FDA under an Emergency Use Authorization (EUA). This EUA will remain in effect (meaning this test can be used) for the duration of the COVID-19 declaration under Section 564(b)(1) of the Act, 21 U.S.C. section 360bbb-3(b)(1), unless the authorization is terminated or revoked.  Performed at [facility], [address] [place], [place] [address]   Respiratory (~20 pathogens) panel by PCR     Status: None   Collection Time: 01/10/24 11:50 AM   Specimen: Nasopharyngeal Swab; Respiratory  Result Value Ref Range Status   Adenovirus NOT DETECTED NOT DETECTED Final   Coronavirus 229E NOT DETECTED NOT DETECTED Final    Comment: (NOTE) The Coronavirus on the Respiratory Panel, DOES NOT test for the novel  Coronavirus (2019 nCoV)    Coronavirus HKU1 NOT DETECTED NOT DETECTED Final   Coronavirus NL63 NOT DETECTED NOT DETECTED Final   Coronavirus OC43 NOT DETECTED NOT DETECTED Final   Metapneumovirus NOT DETECTED NOT DETECTED Final   Rhinovirus / Enterovirus NOT DETECTED NOT DETECTED Final   Influenza A NOT DETECTED NOT DETECTED Final   Influenza B NOT DETECTED NOT DETECTED Final   Parainfluenza Virus 1 NOT DETECTED NOT DETECTED Final   Parainfluenza Virus 2 NOT DETECTED NOT DETECTED Final   Parainfluenza Virus 3 NOT DETECTED NOT DETECTED Final   Parainfluenza Virus 4 NOT DETECTED NOT DETECTED Final   Respiratory Syncytial Virus NOT DETECTED NOT DETECTED Final   Bordetella pertussis NOT DETECTED NOT DETECTED Final   Bordetella Parapertussis NOT DETECTED NOT DETECTED Final   Chlamydophila pneumoniae NOT DETECTED NOT DETECTED Final   Mycoplasma pneumoniae NOT DETECTED NOT DETECTED Final    Comment: Performed at [facility], [address] [place], [place] [address]         Radiology Studies: No results found.       Scheduled Meds:  apixaban   5 mg Oral BID   diltiazem   180 mg Oral Daily   guaiFENesin  600 mg Oral BID   metoprolol  succinate  50 mg Oral Daily   polyethylene glycol  34 g Oral Daily   Continuous Infusions:   LOS: 2 days     Devaughn KATHEE Ban, MD [facility]   If 7PM-7AM, please contact night-coverage [email or web address] Password [email or web address] 01/12/2024, 3:27 PM

## 2024-01-12 NOTE — Plan of Care (Signed)
  Problem: Education: Goal: Knowledge of General Education information will improve Description: Including pain rating scale, medication(s)/side effects and non-pharmacologic comfort measures Outcome: Progressing   Problem: Health Behavior/Discharge Planning: Goal: Ability to manage health-related needs will improve Outcome: Progressing   Problem: Clinical Measurements: Goal: Ability to maintain clinical measurements within normal limits will improve Outcome: Progressing Goal: Will remain free from infection Outcome: Progressing Goal: Diagnostic test results will improve Outcome: Progressing Goal: Respiratory complications will improve Outcome: Progressing Goal: Cardiovascular complication will be avoided Outcome: Progressing   Problem: Activity: Goal: Risk for activity intolerance will decrease Outcome: Progressing   Problem: Nutrition: Goal: Adequate nutrition will be maintained Outcome: Progressing   Problem: Coping: Goal: Level of anxiety will decrease Outcome: Progressing   Problem: Elimination: Goal: Will not experience complications related to bowel motility Outcome: Progressing Goal: Will not experience complications related to urinary retention Outcome: Progressing   Problem: Pain Managment: Goal: General experience of comfort will improve and/or be controlled Outcome: Progressing   Problem: Safety: Goal: Ability to remain free from injury will improve Outcome: Progressing   Problem: Skin Integrity: Goal: Risk for impaired skin integrity will decrease Outcome: Progressing   Problem: Education: Goal: Ability to demonstrate management of disease process will improve Outcome: Progressing Goal: Ability to verbalize understanding of medication therapies will improve Outcome: Progressing   Problem: Activity: Goal: Capacity to carry out activities will improve Outcome: Progressing   Problem: Cardiac: Goal: Ability to achieve and maintain adequate  cardiopulmonary perfusion will improve Outcome: Progressing   Problem: Education: Goal: Knowledge of disease or condition will improve Outcome: Progressing Goal: Knowledge of the prescribed therapeutic regimen will improve Outcome: Progressing   Problem: Activity: Goal: Ability to tolerate increased activity will improve Outcome: Progressing Goal: Will verbalize the importance of balancing activity with adequate rest periods Outcome: Progressing   Problem: Respiratory: Goal: Ability to maintain a clear airway will improve Outcome: Progressing Goal: Levels of oxygenation will improve Outcome: Progressing Goal: Ability to maintain adequate ventilation will improve Outcome: Progressing   Problem: Activity: Goal: Ability to tolerate increased activity will improve Outcome: Progressing   Problem: Clinical Measurements: Goal: Ability to maintain a body temperature in the normal range will improve Outcome: Progressing   Problem: Respiratory: Goal: Ability to maintain adequate ventilation will improve Outcome: Progressing Goal: Ability to maintain a clear airway will improve Outcome: Progressing

## 2024-01-12 NOTE — Plan of Care (Signed)
 Pt calm and cooperative. Endorses lower back pain, improved with PO medications

## 2024-01-13 DIAGNOSIS — J9601 Acute respiratory failure with hypoxia: Secondary | ICD-10-CM | POA: Diagnosis not present

## 2024-01-13 LAB — BASIC METABOLIC PANEL WITH GFR
Anion gap: 9 (ref 5–15)
BUN: 26 mg/dL — ABNORMAL HIGH (ref 8–23)
CO2: 30 mmol/L (ref 22–32)
Calcium: 8.3 mg/dL — ABNORMAL LOW (ref 8.9–10.3)
Chloride: 100 mmol/L (ref 98–111)
Creatinine, Ser: 0.88 mg/dL (ref 0.44–1.00)
GFR, Estimated: >60 mL/min (ref >60)
Glucose, Bld: 100 mg/dL — ABNORMAL HIGH (ref 70–99)
Potassium: 3.9 mmol/L (ref 3.5–5.1)
Sodium: 139 mmol/L (ref 135–145)

## 2024-01-13 NOTE — Progress Notes (Signed)
 Physical Therapy Treatment Patient Details Name: Morgan Jacobs MRN: [id number] DOB: February 06, 1934 Today's Date: 01/13/2024   History of Present Illness Pt is an 89 y/o F admitted on 01/09/24 after presenting with c/o intractable back pain 2/2 acute T1 compression fx. Also noted to have R lower lobe PNA with new O2 requirement. PMH: HFrEF, pulmonary HTN, a-fib on Eliquis , HTN, COPD, CAD s/p proximal LAD stent (2003), NASH, TIA (07/2021), parotid mass (12/2022), chronic back pain & hip pain, osteoporotic vertebral fx    PT Comments  Pt in bed, daughter in room.  Pt is able to transition to sitting with rails and cga x 1. Steady in sitting but does reach back and grab R hip/back area due to pain .  She stands with poor hand placements despite cues and pulls up on walker.  When up leans elbows on walker handles.  Education provided.  She struggles to step today due to inc pain in back.  Takes short seated rest on bed before standing again for minimal standing ex.  She stated she is unable to walk but does transfer to recliner at bedside.  Remains up with needs met.  Reached out to RN in regards to pain medication end educated pt on taking pain meds and asking for them as needed as pain was barrier to mobility today.    If plan is discharge home, recommend the following: A little help with walking and/or transfers;A little help with bathing/dressing/bathroom;Assistance with cooking/housework;Assist for transportation;Help with stairs or ramp for entrance   Can travel by private vehicle        Equipment Recommendations       Recommendations for Other Services       Precautions / Restrictions Precautions Precautions: Fall;Back Recall of Precautions/Restrictions: Intact Precaution/Restrictions Comments: per neurosurgery note:she may feel better with the TLSO brace as this could be supportive across the junction of her fracture pt currently declining Restrictions Weight Bearing Restrictions Per  Provider Order: No     Mobility  Bed Mobility Overal bed mobility: Needs Assistance Bed Mobility: Supine to Sit     Supine to sit: Contact guard, Used rails     General bed mobility comments: remaine dup in chair after session Patient Response: Cooperative  Transfers Overall transfer level: Needs assistance Equipment used: Rolling walker (2 wheels) Transfers: Sit to/from Stand Sit to Stand: Contact guard assist, Min assist           General transfer comment: poor awareness re: hand placement & decreased ability to follow cuing for pushing to standing, pulls up on RW    Ambulation/Gait Ambulation/Gait assistance: Min assist, Contact guard assist Gait Distance (Feet): 3 Feet Assistive device: Rolling walker (2 wheels) Gait Pattern/deviations: Decreased step length - right, Decreased step length - left, Decreased stride length Gait velocity: decreased     General Gait Details: leans on walker handles  unable to walk due to back pain   Stairs             Wheelchair Mobility     Tilt Bed Tilt Bed Patient Response: Cooperative  Modified Rankin (Stroke Patients Only)       Balance Overall balance assessment: Needs assistance Sitting-balance support: Feet supported Sitting balance-Leahy Scale: Good     Standing balance support: Bilateral upper extremity supported, During functional activity, Reliant on assistive device for balance Standing balance-Leahy Scale: Fair  Communication Communication Communication: No apparent difficulties  Cognition Arousal: Alert Behavior During Therapy: WFL for tasks assessed/performed   PT - Cognitive impairments: Safety/Judgement, Awareness                         Following commands: Intact      Cueing Cueing Techniques: Verbal cues  Exercises      General Comments        Pertinent Vitals/Pain Pain Assessment Pain Assessment: Faces Faces Pain Scale:  Hurts even more Pain Location: back with mobility Pain Descriptors / Indicators: Discomfort, Grimacing Pain Intervention(s): Limited activity within patient's tolerance, Monitored during session, Repositioned, Patient requesting pain meds-RN notified    Home Living                          Prior Function            PT Goals (current goals can now be found in the care plan section) Progress towards PT goals: Progressing toward goals    Frequency    Min 2X/week      PT Plan      Co-evaluation              AM-PAC PT 6 Clicks Mobility   Outcome Measure  Help needed turning from your back to your side while in a flat bed without using bedrails?: A Little Help needed moving from lying on your back to sitting on the side of a flat bed without using bedrails?: A Little Help needed moving to and from a bed to a chair (including a wheelchair)?: A Little Help needed standing up from a chair using your arms (e.g., wheelchair or bedside chair)?: A Little Help needed to walk in hospital room?: A Lot Help needed climbing 3-5 steps with a railing? : Total 6 Click Score: 15    End of Session Equipment Utilized During Treatment: Gait belt Activity Tolerance: Patient limited by pain Patient left: in chair;with chair alarm set;with call bell/phone within reach;with family/visitor present Nurse Communication: Mobility status PT Visit Diagnosis: Pain;Muscle weakness (generalized) (M62.81);Unsteadiness on feet (R26.81)     Time: 9059-9045 PT Time Calculation (min) (ACUTE ONLY): 14 min  Charges:    $Therapeutic Activity: 8-22 mins PT General Charges $$ ACUTE PT VISIT: 1 Visit                 Geralene Afshar, PTA 01/13/24, 10:34 AM

## 2024-01-13 NOTE — Progress Notes (Signed)
 PROGRESS NOTE    Morgan Jacobs  FMW:[id number] DOB: [birth year] DOA: 01/09/2024 PCP: Montey Lot, PA-C      Brief Narrative:   Morgan Jacobs is a 89 y.o. female with medical history significant for HFrEF (EF 45 to 50%, 12/2022), pulmonary hypertension, A-fib on Eliquis , HTN, COPD, CAD s/p proximal LAD stent (2003), NASH,  TIA(07/2021), parotid mass noted in 12/2022, chronic back pain and hip pain with history of osteoporotic vertebral fracture being admitted with intractable back pain secondary to an acute T1 compression fracture, as well as a right lower lobe pneumonia seen on CT with new O2 requirement of 2 L. Patient was initially brought in by EMS with a several day history of worsening of her chronic right-sided low back pain causing her to stay in bed which is not normal for her.  Her decreased mobility was initially attributed to her burying her son recently.  Additionally, patient has had a cough for the past month, seeing her PCP on 2 occasions for which she was started on prednisone without improvement.  She has had no chest pain, lower extremity pain or swelling. In the ED afebrile, tachypneic to 22, normal pulse and blood pressure, O2 sat 88 on room air requiring 2 L to get to the mid to high 90s. Labs notable for WBC 12.5 with lactic acid 1.2.  BNP 954 Total bilirubin 1.5 UA unremarkable Respiratory viral panel not done EKG not done CT lumbar and thoracic spine showing acute compression fracture superior endplate of T11 without retropulsion and less than 10% vertebral body height loss with abnormal chronic findings CT T-spine also showed airspace opacity right lower lobe possibly asymmetric edema versus pneumonia as well as interstitial edema in the lungs with small bilateral pleural effusions Patient treated with a dose of IV Lasix  Started on Rocephin and azithromycin for community-acquired pneumonia The ED provider: Spoke with neurosurgeon, Dr. Penne Sharps who recommended no  TLSO brace due to possible pneumonia, acute intervention but will see in the morning.   Assessment & Plan:   Principal Problem:   Acute respiratory failure with hypoxia (HCC) Active Problems:   CAP (community acquired pneumonia)   Acute on chronic HFrEF (heart failure with reduced ejection fraction) (HCC)   COPD (chronic obstructive pulmonary disease) (HCC)   Non-traumatic compression fracture of T11 thoracic vertebra, initial encounter (HCC)   Intractable back pain   Ambulatory dysfunction   Obesity, Class III, BMI 40-49.9 (morbid obesity) (HCC)   Paroxysmal atrial fibrillation (HCC)   HTN (hypertension)   Stage 3a chronic kidney disease (CKD) (HCC)   History of osteoporotic pathological fracture   Fatty liver   History of TIA (transient ischemic attack)   Pulmonary hypertension (HCC)   CAD S/P proximal LAD stent (2003)   Acute on chronic congestive heart failure (HCC)   Thoracic compression fracture, closed, initial encounter (HCC)  # T11 compression fracture Nontraumatic. Seen by neurosurg, surgery deferred - patient considering tlso brace for comfort - pain control - pt/ot advising snf, toc consulted, patient/daughter will only accept a bed close to home, are aware if bed offers elsewhere and no offers close to home will either need to accept those or plan to d/c home. Pursuing possible bed a [facility]  # CHF with acute exacerbation Imaging consistent with pulm edema. Several rounds recent steroids, no wheeze. No fever or cough to suggest pna, does have mild leukocytosis. Covid/flu/rsv neg. TTE no sig changes. Rvp neg - will hold on additional abx and steroids for now -  resumed home lasix  - cont home metop    # Acute hypoxic respiratory failure 2/2 above, 89 in ER, resolved with 2 liters. Dyspneic on arrival. Resolved - monitor  # CAD DES to LAD around 2003. No chest pain. No rwmas on tte - home apixaban , metop - will hold home asa  # COPD Do not think  exacerbated as above  # A-fib Rate controlled - cont home metop, apixaban , diltiazem   # HTN controlled - home metop, dilt - hold home losartan for now  # CKD 3a At baseline, kidney function actually improved to gfr greater than 60 today - monitor   DVT prophylaxis: apixaban  Code Status: dnr/dni, confirmed with daughter at bedside on 10/23 Family Communication: daughter at bedside 10/26  Level of care: Telemetry Medical Status is: inpt     Consultants:  neurosurgery  Procedures: none  Antimicrobials:  See above    Subjective: Reports stable low back pain, tolerating diet, regular BMs  Objective: Vitals:   01/12/24 2008 01/13/24 0408 01/13/24 0500 01/13/24 0726  BP: 120/62 129/68  131/65  Pulse: 95 65  (!) 56  Resp: 16 16  16   Temp: 98.6 F (37 C) 98.4 F (36.9 C)  98.5 F (36.9 C)  TempSrc: Oral   Oral  SpO2: 94% 92%  93%  Weight:   82.5 kg   Height:        Intake/Output Summary (Last 24 hours) at 01/13/2024 1226 Last data filed at 01/12/2024 2000 Gross per 24 hour  Intake 0 ml  Output 550 ml  Net -550 ml   Filed Weights   01/11/24 0500 01/12/24 0712 01/13/24 0500  Weight: 82.1 kg 77.4 kg 82.5 kg    Examination:  General exam: Appears calm and comfortable  Respiratory system: bibasilar rales, normal wob, no wheeze Cardiovascular system: S1 & S2 heard, RRR. No JVD, murmurs, rubs, gallops or clicks. trace pedal edema. Gastrointestinal system: Abdomen is nondistended, soft and nontender. No organomegaly or masses felt. Normal bowel sounds heard. Central nervous system: Alert and oriented. No focal neurological deficits. Extremities: Symmetric 5 x 5 power. Skin: No rashes, lesions or ulcers Psychiatry: calm     Data Reviewed: I have personally reviewed following labs and imaging studies  CBC: Recent Labs  Lab 01/09/24 1527 01/10/24 0504 01/11/24 0534 01/12/24 0418  WBC 12.5* 10.1 11.3* 10.1  NEUTROABS 10.1*  --   --   --   HGB 14.5  14.2 13.8 14.0  HCT 42.6 42.6 42.3 42.1  MCV 103.6* 104.2* 106.0* 104.7*  PLT 202 191 216 226   Basic Metabolic Panel: Recent Labs  Lab 01/09/24 1527 01/10/24 0504 01/11/24 0534 01/12/24 0418 01/13/24 0505  NA 141 144 141 140 139  K 3.5 3.7 3.8 3.7 3.9  CL 107 102 97* 97* 100  CO2 23 30 34* 33* 30  GLUCOSE 105* 154* 121* 99 100*  BUN 22 24* 34* 35* 26*  CREATININE 0.83 0.95 1.14* 1.13* 0.88  CALCIUM  8.4* 8.3* 8.3* 8.2* 8.3*   GFR: Estimated Creatinine Clearance: 42.1 mL/min (by C-G formula based on SCr of 0.88 mg/dL). Liver Function Tests: Recent Labs  Lab 01/09/24 1527  AST 30  ALT 41  ALKPHOS 74  BILITOT 1.5*  PROT 6.5  ALBUMIN 3.1*   Recent Labs  Lab 01/09/24 1527  LIPASE 58*   No results for input(s): AMMONIA in the last 168 hours. Coagulation Profile: No results for input(s): INR, PROTIME in the last 168 hours. Cardiac Enzymes: No results for  input(s): CKTOTAL, CKMB, CKMBINDEX, TROPONINI in the last 168 hours. BNP (last 3 results) No results for input(s): PROBNP in the last 8760 hours. HbA1C: No results for input(s): HGBA1C in the last 72 hours. CBG: No results for input(s): GLUCAP in the last 168 hours. Lipid Profile: No results for input(s): CHOL, HDL, LDLCALC, TRIG, CHOLHDL, LDLDIRECT in the last 72 hours. Thyroid  Function Tests: No results for input(s): TSH, T4TOTAL, FREET4, T3FREE, THYROIDAB in the last 72 hours. Anemia Panel: No results for input(s): VITAMINB12, FOLATE, FERRITIN, TIBC, IRON, RETICCTPCT in the last 72 hours. Urine analysis:    Component Value Date/Time   COLORURINE YELLOW (A) 01/09/2024 1630   APPEARANCEUR CLEAR (A) 01/09/2024 1630   LABSPEC 1.013 01/09/2024 1630   PHURINE 6.0 01/09/2024 1630   GLUCOSEU NEGATIVE 01/09/2024 1630   HGBUR NEGATIVE 01/09/2024 1630   BILIRUBINUR NEGATIVE 01/09/2024 1630   KETONESUR NEGATIVE 01/09/2024 1630   PROTEINUR NEGATIVE 01/09/2024  1630   NITRITE NEGATIVE 01/09/2024 1630   LEUKOCYTESUR NEGATIVE 01/09/2024 1630   Sepsis Labs: @LABRCNTIP (procalcitonin:4,lacticidven:4)  ) Recent Results (from the past 240 hours)  Blood culture (single)     Status: None (Preliminary result)   Collection Time: 01/09/24  8:11 PM   Specimen: BLOOD  Result Value Ref Range Status   Specimen Description BLOOD BLOOD RIGHT ARM  Final   Special Requests   Final    Blood Culture results may not be optimal due to an inadequate volume of blood received in culture bottles BOTTLES DRAWN AEROBIC AND ANAEROBIC   Culture   Final    NO GROWTH 4 DAYS Performed at [facility] [address] [place], [place] [address]    Report Status PENDING  Incomplete  Resp panel by RT-PCR (RSV, Flu A&B, Covid) Anterior Nasal Swab     Status: None   Collection Time: 01/09/24 11:17 PM   Specimen: Anterior Nasal Swab  Result Value Ref Range Status   SARS Coronavirus 2 by RT PCR NEGATIVE NEGATIVE Final    Comment: (NOTE) SARS-CoV-2 target nucleic acids are NOT DETECTED.  The SARS-CoV-2 RNA is generally detectable in upper respiratory specimens during the acute phase of infection. The lowest concentration of SARS-CoV-2 viral copies this assay can detect is 138 copies/mL. A negative result does not preclude SARS-Cov-2 infection and should not be used as the sole basis for treatment or other patient management decisions. A negative result may occur with  improper specimen collection/handling, submission of specimen other than nasopharyngeal swab, presence of viral mutation(s) within the areas targeted by this assay, and inadequate number of viral copies(<138 copies/mL). A negative result must be combined with clinical observations, patient history, and epidemiological information. The expected result is Negative.  Fact Sheet for Patients:  bloggercourse.com  Fact Sheet for Healthcare Providers:   seriousbroker.it  This test is no t yet approved or cleared by the [place]  FDA and  has been authorized for detection and/or diagnosis of SARS-CoV-2 by FDA under an Emergency Use Authorization (EUA). This EUA will remain  in effect (meaning this test can be used) for the duration of the COVID-19 declaration under Section 564(b)(1) of the Act, 21 U.S.C.section 360bbb-3(b)(1), unless the authorization is terminated  or revoked sooner.       Influenza A by PCR NEGATIVE NEGATIVE Final   Influenza B by PCR NEGATIVE NEGATIVE Final    Comment: (NOTE) The Xpert Xpress SARS-CoV-2/FLU/RSV plus assay is intended as an aid in the diagnosis of influenza from Nasopharyngeal swab specimens and should  not be used as a sole basis for treatment. Nasal washings and aspirates are unacceptable for Xpert Xpress SARS-CoV-2/FLU/RSV testing.  Fact Sheet for Patients: bloggercourse.com  Fact Sheet for Healthcare Providers: seriousbroker.it  This test is not yet approved or cleared by the [place]  FDA and has been authorized for detection and/or diagnosis of SARS-CoV-2 by FDA under an Emergency Use Authorization (EUA). This EUA will remain in effect (meaning this test can be used) for the duration of the COVID-19 declaration under Section 564(b)(1) of the Act, 21 U.S.C. section 360bbb-3(b)(1), unless the authorization is terminated or revoked.     Resp Syncytial Virus by PCR NEGATIVE NEGATIVE Final    Comment: (NOTE) Fact Sheet for Patients: bloggercourse.com  Fact Sheet for Healthcare Providers: seriousbroker.it  This test is not yet approved or cleared by the [place]  FDA and has been authorized for detection and/or diagnosis of SARS-CoV-2 by FDA under an Emergency Use Authorization (EUA). This EUA will remain in effect (meaning this test can be used) for  the duration of the COVID-19 declaration under Section 564(b)(1) of the Act, 21 U.S.C. section 360bbb-3(b)(1), unless the authorization is terminated or revoked.  Performed at [facility], [address] [place], [place] [address]   Respiratory (~20 pathogens) panel by PCR     Status: None   Collection Time: 01/10/24 11:50 AM   Specimen: Nasopharyngeal Swab; Respiratory  Result Value Ref Range Status   Adenovirus NOT DETECTED NOT DETECTED Final   Coronavirus 229E NOT DETECTED NOT DETECTED Final    Comment: (NOTE) The Coronavirus on the Respiratory Panel, DOES NOT test for the novel  Coronavirus (2019 nCoV)    Coronavirus HKU1 NOT DETECTED NOT DETECTED Final   Coronavirus NL63 NOT DETECTED NOT DETECTED Final   Coronavirus OC43 NOT DETECTED NOT DETECTED Final   Metapneumovirus NOT DETECTED NOT DETECTED Final   Rhinovirus / Enterovirus NOT DETECTED NOT DETECTED Final   Influenza A NOT DETECTED NOT DETECTED Final   Influenza B NOT DETECTED NOT DETECTED Final   Parainfluenza Virus 1 NOT DETECTED NOT DETECTED Final   Parainfluenza Virus 2 NOT DETECTED NOT DETECTED Final   Parainfluenza Virus 3 NOT DETECTED NOT DETECTED Final   Parainfluenza Virus 4 NOT DETECTED NOT DETECTED Final   Respiratory Syncytial Virus NOT DETECTED NOT DETECTED Final   Bordetella pertussis NOT DETECTED NOT DETECTED Final   Bordetella Parapertussis NOT DETECTED NOT DETECTED Final   Chlamydophila pneumoniae NOT DETECTED NOT DETECTED Final   Mycoplasma pneumoniae NOT DETECTED NOT DETECTED Final    Comment: Performed at [facility], [address] [place], [place] [address]         Radiology Studies: No results found.       Scheduled Meds:  apixaban   5 mg Oral BID   diltiazem   180 mg Oral Daily   furosemide   20 mg Oral Daily   guaiFENesin  600 mg Oral BID   metoprolol  succinate  50 mg Oral Daily   polyethylene glycol  34 g Oral Daily   Continuous Infusions:   LOS: 3 days      Devaughn KATHEE Ban, MD [facility]   If 7PM-7AM, please contact night-coverage [email or web address] Password [email or web address] 01/13/2024, 12:26 PM

## 2024-01-13 NOTE — TOC Progression Note (Signed)
 Transition of Care [facility]) - Progression Note    Patient Details  Name: Morgan Jacobs MRN: [id number] Date of Birth: Apr 05, 1933  Transition of [facility] CM/SW Contact  Glorious Flicker L Rasean Joos, [place] Phone Number: 01/13/2024, 12:36 PM  Clinical Narrative:     Secure chat received. Per attending, and physical therapy, daughter, wants clinicals sent to [facility]. CSW faxed clinicals to [phone].                       Expected Discharge Plan and Services                                               Social Drivers of Health (SDOH) Interventions SDOH Screenings   Food Insecurity: No Food Insecurity (01/10/2024)  Housing: Unknown (01/10/2024)  Transportation Needs: No Transportation Needs (01/09/2024)  Utilities: Not At Risk (01/09/2024)  Financial Resource Strain: Low Risk  (01/10/2024)  Tobacco Use: High Risk (01/10/2024)    Readmission Risk Interventions     No data to display

## 2024-01-13 NOTE — Plan of Care (Signed)
  Problem: Education: Goal: Knowledge of General Education information will improve Description: Including pain rating scale, medication(s)/side effects and non-pharmacologic comfort measures Outcome: Progressing   Problem: Health Behavior/Discharge Planning: Goal: Ability to manage health-related needs will improve Outcome: Progressing   Problem: Clinical Measurements: Goal: Ability to maintain clinical measurements within normal limits will improve Outcome: Progressing Goal: Will remain free from infection Outcome: Progressing Goal: Diagnostic test results will improve Outcome: Progressing Goal: Respiratory complications will improve Outcome: Progressing Goal: Cardiovascular complication will be avoided Outcome: Progressing   Problem: Activity: Goal: Risk for activity intolerance will decrease Outcome: Progressing   Problem: Nutrition: Goal: Adequate nutrition will be maintained Outcome: Progressing   Problem: Coping: Goal: Level of anxiety will decrease Outcome: Progressing   Problem: Elimination: Goal: Will not experience complications related to bowel motility Outcome: Progressing Goal: Will not experience complications related to urinary retention Outcome: Progressing   Problem: Pain Managment: Goal: General experience of comfort will improve and/or be controlled Outcome: Progressing   Problem: Safety: Goal: Ability to remain free from injury will improve Outcome: Progressing   Problem: Skin Integrity: Goal: Risk for impaired skin integrity will decrease Outcome: Progressing   Problem: Education: Goal: Ability to demonstrate management of disease process will improve Outcome: Progressing Goal: Ability to verbalize understanding of medication therapies will improve Outcome: Progressing   Problem: Activity: Goal: Capacity to carry out activities will improve Outcome: Progressing   Problem: Cardiac: Goal: Ability to achieve and maintain adequate  cardiopulmonary perfusion will improve Outcome: Progressing   Problem: Education: Goal: Knowledge of disease or condition will improve Outcome: Progressing Goal: Knowledge of the prescribed therapeutic regimen will improve Outcome: Progressing   Problem: Activity: Goal: Ability to tolerate increased activity will improve Outcome: Progressing Goal: Will verbalize the importance of balancing activity with adequate rest periods Outcome: Progressing   Problem: Respiratory: Goal: Ability to maintain a clear airway will improve Outcome: Progressing Goal: Levels of oxygenation will improve Outcome: Progressing Goal: Ability to maintain adequate ventilation will improve Outcome: Progressing   Problem: Activity: Goal: Ability to tolerate increased activity will improve Outcome: Progressing   Problem: Clinical Measurements: Goal: Ability to maintain a body temperature in the normal range will improve Outcome: Progressing   Problem: Respiratory: Goal: Ability to maintain adequate ventilation will improve Outcome: Progressing Goal: Ability to maintain a clear airway will improve Outcome: Progressing

## 2024-01-14 DIAGNOSIS — J9601 Acute respiratory failure with hypoxia: Secondary | ICD-10-CM | POA: Diagnosis not present

## 2024-01-14 LAB — BASIC METABOLIC PANEL WITH GFR
Anion gap: 11 (ref 5–15)
BUN: 23 mg/dL (ref 8–23)
CO2: 27 mmol/L (ref 22–32)
Calcium: 8.3 mg/dL — ABNORMAL LOW (ref 8.9–10.3)
Chloride: 100 mmol/L (ref 98–111)
Creatinine, Ser: 0.82 mg/dL (ref 0.44–1.00)
GFR, Estimated: >60 mL/min (ref >60)
Glucose, Bld: 110 mg/dL — ABNORMAL HIGH (ref 70–99)
Potassium: 3.8 mmol/L (ref 3.5–5.1)
Sodium: 138 mmol/L (ref 135–145)

## 2024-01-14 LAB — CULTURE, BLOOD (SINGLE): Culture: NO GROWTH

## 2024-01-14 NOTE — Care Management Important Message (Signed)
 Important Message  Patient Details  Name: Morgan Jacobs MRN: [id number] Date of Birth: [birth year]   Important Message Given:  Yes - Medicare IM     Mignonne Afonso W, CMA 01/14/2024, 2:47 PM

## 2024-01-14 NOTE — Progress Notes (Signed)
 Occupational Therapy Treatment Patient Details Name: Morgan Jacobs MRN: [id number] DOB: [birth year] Today's Date: 01/14/2024   History of present illness Pt is an 89 y/o F admitted on 01/09/24 after presenting with c/o intractable back pain 2/2 acute T1 compression fx. Also noted to have R lower lobe PNA with new O2 requirement. PMH: HFrEF, pulmonary HTN, a-fib on Eliquis , HTN, COPD, CAD s/p proximal LAD stent (2003), NASH, TIA (07/2021), parotid mass (12/2022), chronic back pain & hip pain, osteoporotic vertebral fx   OT comments  Upon entering the room, pt supine in bed and agreeable to OT intervention with encouragement. Pt performing supine >sit with log roll and min A. Pt performing sit >stand with min A and use of RW. Pt then begins to urinate on floor. Pt needing min A to ambulate to bathroom with RW and continues to urinate on floor the entire way to bathroom. Pt seated on commode and finishes voiding and also has BM. While seated, OT assists pt with changing hospital gown and socks. Pt able to perform hygiene while seated and then returns to bed at end of session. Pt needing mod multimodal cuing for back precautions with mobility as pt continues to twist with mobility. OT called for pain medication and RN arrived at end of session as therapist is assisting pt back into bed. Call bell and all needed items within reach upon exiting the room.       If plan is discharge home, recommend the following:  A little help with walking and/or transfers;A little help with bathing/dressing/bathroom   Equipment Recommendations  Other (comment) (defer to venue of care)       Precautions / Restrictions Precautions Precautions: Fall;Back Precaution/Restrictions Comments: per neurosurgery note:she may feel better with the TLSO brace as this could be supportive across the junction of her fracture pt currently declining       Mobility Bed Mobility Overal bed mobility: Needs Assistance Bed Mobility:  Rolling, Sidelying to Sit, Sit to Sidelying   Sidelying to sit: Min assist     Sit to sidelying: Min assist      Transfers Overall transfer level: Needs assistance Equipment used: Rolling walker (2 wheels) Transfers: Sit to/from Stand Sit to Stand: Min assist                 Balance Overall balance assessment: Needs assistance Sitting-balance support: Feet supported Sitting balance-Leahy Scale: Good     Standing balance support: Bilateral upper extremity supported, During functional activity, Reliant on assistive device for balance Standing balance-Leahy Scale: Fair                             ADL either performed or assessed with clinical judgement   ADL Overall ADL's : Needs assistance/impaired                 Upper Body Dressing : Minimal assistance Upper Body Dressing Details (indicate cue type and reason): donn gown Lower Body Dressing: Maximal assistance Lower Body Dressing Details (indicate cue type and reason): socks Toilet Transfer: Minimal assistance;Rolling walker (2 wheels);Ambulation   Toileting- Clothing Manipulation and Hygiene: Minimal assistance;Sit to/from stand               Vision Patient Visual Report: No change from baseline           Communication Communication Communication: No apparent difficulties   Cognition Arousal: Alert Behavior During Therapy: WFL for tasks assessed/performed Cognition: No apparent impairments  Following commands: Intact        Cueing   Cueing Techniques: Verbal cues             Pertinent Vitals/ Pain       Pain Assessment Pain Assessment: Faces Faces Pain Scale: Hurts little more Pain Location: back with mobility Pain Descriptors / Indicators: Discomfort, Grimacing Pain Intervention(s): Limited activity within patient's tolerance, Monitored during session, Repositioned, Patient requesting pain meds-RN notified          Frequency  Min 2X/week        Progress Toward Goals  OT Goals(current goals can now be found in the care plan section)  Progress towards OT goals: Progressing toward goals      AM-PAC OT 6 Clicks Daily Activity     Outcome Measure   Help from another person eating meals?: None Help from another person taking care of personal grooming?: None Help from another person toileting, which includes using toliet, bedpan, or urinal?: A Lot Help from another person bathing (including washing, rinsing, drying)?: A Lot Help from another person to put on and taking off regular upper body clothing?: A Little Help from another person to put on and taking off regular lower body clothing?: A Lot 6 Click Score: 17    End of Session Equipment Utilized During Treatment: Rolling walker (2 wheels)  OT Visit Diagnosis: Other abnormalities of gait and mobility (R26.89);Muscle weakness (generalized) (M62.81)   Activity Tolerance Patient tolerated treatment well   Patient Left in chair;with call bell/phone within reach;with chair alarm set;with family/visitor present   Nurse Communication Mobility status        Time: 8496-8469 OT Time Calculation (min): 27 min  Charges: OT General Charges $OT Visit: 1 Visit OT Treatments $Self Care/Home Management : 23-37 mins  Izetta Claude, MS, OTR/L , CBIS ascom [phone]  01/14/24, 3:52 PM

## 2024-01-14 NOTE — Plan of Care (Signed)
  Problem: Education: Goal: Knowledge of General Education information will improve Description: Including pain rating scale, medication(s)/side effects and non-pharmacologic comfort measures Outcome: Progressing   Problem: Health Behavior/Discharge Planning: Goal: Ability to manage health-related needs will improve Outcome: Progressing   Problem: Clinical Measurements: Goal: Ability to maintain clinical measurements within normal limits will improve Outcome: Progressing Goal: Will remain free from infection Outcome: Progressing   Problem: Coping: Goal: Level of anxiety will decrease Outcome: Progressing   Problem: Pain Managment: Goal: General experience of comfort will improve and/or be controlled Outcome: Progressing

## 2024-01-14 NOTE — Progress Notes (Addendum)
 Physical Therapy Treatment Patient Details Name: Morgan Jacobs MRN: [id number] DOB: [birth year] Today's Date: 01/14/2024   History of Present Illness Pt is an 89 y/o F admitted on 01/09/24 after presenting with c/o intractable back pain 2/2 acute T1 compression fx. Also noted to have R lower lobe PNA with new O2 requirement. 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  She is able to transition to sitting with bed features and cga x 1.  Steady in sitting with less outright signs of pain.  She does however continue to pull up on walker handles to stand despite cues to push off bed.  She is able to progress gait 100' with RW with slow gait limited by pain.  As pain increases she leans on walker resting forearms on handles.  Cues to correct but state pulling increases in back as distance increases.  She does opt to remain up in chair after session  with safety on and needs met but does not feel she can walk further at this time.  Pt continued to need +1 for mobility and general safety.  Discharge recommendations remain appropriate.   If plan is discharge home, recommend the following: A little help with walking and/or transfers;A little help with bathing/dressing/bathroom;Assistance with cooking/housework;Assist for transportation;Help with stairs or ramp for entrance   Can travel by private vehicle        Equipment Recommendations       Recommendations for Other Services       Precautions / Restrictions Precautions Precautions: Fall;Back Recall of Precautions/Restrictions: Intact Precaution/Restrictions Comments: per neurosurgery note:she may feel better with the TLSO brace as this could be supportive across the junction of her fracture pt currently  declining Restrictions Weight Bearing Restrictions Per Provider Order: No     Mobility  Bed Mobility Overal bed mobility: Needs Assistance Bed Mobility: Supine to Sit     Supine to sit: Supervision, HOB elevated, Used rails       Patient Response: Cooperative  Transfers Overall transfer level: Needs assistance Equipment used: Rolling walker (2 wheels) Transfers: Sit to/from Stand Sit to Stand: Contact guard assist, Min assist           General transfer comment: poor awareness re: hand placement & decreased ability to follow cuing for pushing to standing, pulls up on RW    Ambulation/Gait Ambulation/Gait assistance: Min assist, Contact guard assist Gait Distance (Feet): 100 Feet Assistive device: Rolling walker (2 wheels) Gait Pattern/deviations: Decreased step length - right, Decreased step length - left, Decreased stride length Gait velocity: decreased     General Gait Details: leans on walker handles, increases when fatigued   Stairs             Wheelchair Mobility     Tilt Bed Tilt Bed Patient Response: Cooperative  Modified Rankin (Stroke Patients Only)       Balance Overall balance assessment: Needs assistance Sitting-balance support: Feet supported Sitting balance-Leahy Scale: Good     Standing balance support: Bilateral upper extremity supported, During functional activity, Reliant on assistive device for balance Standing balance-Leahy Scale: Fair                              Communication Communication Communication: No apparent difficulties  Cognition Arousal: Alert Behavior During Therapy: WFL for tasks assessed/performed   PT - Cognitive impairments: Safety/Judgement, Awareness                         Following commands: Intact      Cueing Cueing Techniques: Verbal cues  Exercises      General Comments        Pertinent Vitals/Pain Pain Assessment Pain Assessment: Faces Faces Pain Scale: Hurts  little more Pain Location: back with mobility Pain Descriptors / Indicators: Discomfort, Grimacing Pain Intervention(s): Limited activity within patient's tolerance, Monitored during session, Premedicated before session, Repositioned    Home Living                          Prior Function            PT Goals (current goals can now be found in the care plan section) Progress towards PT goals: Progressing toward goals    Frequency    Min 2X/week      PT Plan      Co-evaluation              AM-PAC PT 6 Clicks Mobility   Outcome Measure  Help needed turning from your back to your side while in a flat bed without using bedrails?: A Little Help needed moving from lying on your back to sitting on the side of a flat bed without using bedrails?: A Little Help needed moving to and from a bed to a chair (including a wheelchair)?: A Little Help needed standing up from a chair using your arms (e.g., wheelchair or bedside chair)?: A Little Help needed to walk in hospital room?: A Little Help needed climbing 3-5 steps with a railing? : A Lot 6 Click Score: 17    End of Session Equipment Utilized During Treatment: Gait belt Activity Tolerance: Patient limited by pain Patient left: in chair;with chair alarm set;with call bell/phone within reach;with nursing/sitter in room Nurse Communication: Mobility status PT Visit Diagnosis: Pain;Muscle weakness (generalized) (M62.81);Unsteadiness on feet (R26.81)     Time: 9079-9068 PT Time Calculation (min) (ACUTE ONLY): 11 min  Charges:    $Gait Training: 8-22 mins PT General Charges $$ ACUTE PT VISIT: 1 Visit                   Sherion Dooly, PTA 01/14/24, 9:35 AM

## 2024-01-14 NOTE — TOC Progression Note (Addendum)
 Transition of Care [facility]) - Progression Note    Patient Details  Name: Morgan Jacobs MRN: [id number] Date of Birth: 1934/03/03  [facility] of [facility]) CM/SW Contact  Alvaro Louder, [place] Phone Number: 01/14/2024, 3:02 PM  Clinical Narrative:   [facility] requested updated PT and Progress notes before starting Auth for patient to admit. [facility] faxed over additional information. Shara has been started by SNF.  TOC to follow for discharge                      Expected Discharge Plan and Services                                               Social Drivers of Health (SDOH) Interventions SDOH Screenings   Food Insecurity: No Food Insecurity (01/10/2024)  Housing: Unknown (01/10/2024)  Transportation Needs: No Transportation Needs (01/09/2024)  Utilities: Not At Risk (01/09/2024)  Financial Resource Strain: Low Risk  (01/10/2024)  Tobacco Use: High Risk (01/10/2024)    Readmission Risk Interventions     No data to display

## 2024-01-14 NOTE — Progress Notes (Signed)
 PROGRESS NOTE    Morgan Jacobs  FMW:[id number] DOB: 18-Oct-1933 DOA: 01/09/2024 PCP: Montey Lot, PA-C      Brief Narrative:   Morgan Jacobs is a 89 y.o. female with medical history significant for HFrEF (EF 45 to 50%, 12/2022), pulmonary hypertension, A-fib on Eliquis , HTN, COPD, CAD s/p proximal LAD stent (2003), NASH,  TIA(07/2021), parotid mass noted in 12/2022, chronic back pain and hip pain with history of osteoporotic vertebral fracture being admitted with intractable back pain secondary to an acute T1 compression fracture, as well as a right lower lobe pneumonia seen on CT with new O2 requirement of 2 L. Patient was initially brought in by EMS with a several day history of worsening of her chronic right-sided low back pain causing her to stay in bed which is not normal for her.  Her decreased mobility was initially attributed to her burying her son recently.  Additionally, patient has had a cough for the past month, seeing her PCP on 2 occasions for which she was started on prednisone without improvement.  She has had no chest pain, lower extremity pain or swelling. In the ED afebrile, tachypneic to 22, normal pulse and blood pressure, O2 sat 88 on room air requiring 2 L to get to the mid to high 90s. Labs notable for WBC 12.5 with lactic acid 1.2.  BNP 954 Total bilirubin 1.5 UA unremarkable Respiratory viral panel not done EKG not done CT lumbar and thoracic spine showing acute compression fracture superior endplate of T11 without retropulsion and less than 10% vertebral body height loss with abnormal chronic findings CT T-spine also showed airspace opacity right lower lobe possibly asymmetric edema versus pneumonia as well as interstitial edema in the lungs with small bilateral pleural effusions Patient treated with a dose of IV Lasix  Started on Rocephin and azithromycin for community-acquired pneumonia The ED provider: Spoke with neurosurgeon, Dr. Penne Sharps who recommended no  TLSO brace due to possible pneumonia, acute intervention but will see in the morning.   Assessment & Plan:   Principal Problem:   Acute respiratory failure with hypoxia (HCC) Active Problems:   CAP (community acquired pneumonia)   Acute on chronic HFrEF (heart failure with reduced ejection fraction) (HCC)   COPD (chronic obstructive pulmonary disease) (HCC)   Non-traumatic compression fracture of T11 thoracic vertebra, initial encounter (HCC)   Intractable back pain   Ambulatory dysfunction   Obesity, Class III, BMI 40-49.9 (morbid obesity) (HCC)   Paroxysmal atrial fibrillation (HCC)   HTN (hypertension)   Stage 3a chronic kidney disease (CKD) (HCC)   History of osteoporotic pathological fracture   Fatty liver   History of TIA (transient ischemic attack)   Pulmonary hypertension (HCC)   CAD S/P proximal LAD stent (2003)   Acute on chronic congestive heart failure (HCC)   Thoracic compression fracture, closed, initial encounter (HCC)  # T11 compression fracture Nontraumatic. Seen by neurosurg, surgery deferred - patient declines tlso brace for comfort - pain control - pt/ot advising snf, toc consulted, has bed at [facility], auth now pending  # CHF with acute exacerbation Imaging consistent with pulm edema. Several rounds recent steroids, no wheeze. No fever or cough to suggest pna, does have mild leukocytosis. Covid/flu/rsv neg. TTE no sig changes. Rvp neg - will hold on additional abx and steroids for now - resumed home lasix  - cont home metop    # Acute hypoxic respiratory failure 2/2 above, 89 in ER, resolved with 2 liters. Dyspneic on arrival. Resolved -  monitor  # CAD DES to LAD around 2003. No chest pain. No rwmas on tte - home apixaban , metop - will hold home asa  # COPD Do not think exacerbated as above  # A-fib Rate controlled - cont home metop, apixaban , diltiazem   # HTN controlled - home metop, dilt - hold home losartan for now  # CKD 3a At  baseline, kidney function actually improved to gfr greater than 60 now - monitor   DVT prophylaxis: apixaban  Code Status: dnr/dni, confirmed with daughter at bedside on 10/23 Family Communication: daughter at bedside 10/27  Level of care: Telemetry Medical Status is: inpt     Consultants:  neurosurgery  Procedures: none  Antimicrobials:  See above    Subjective: Reports no back pain at rest, tolerating diet  Objective: Vitals:   01/14/24 0529 01/14/24 0758 01/14/24 0919 01/14/24 1419  BP: 122/71 132/84  (!) 109/51  Pulse: 87 (!) 103 94 76  Resp: 18 17  18   Temp: 98.3 F (36.8 C) 97.6 F (36.4 C)  (!) 97.5 F (36.4 C)  TempSrc:  Oral    SpO2: 91% 95%  94%  Weight: 82.5 kg     Height:        Intake/Output Summary (Last 24 hours) at 01/14/2024 1428 Last data filed at 01/14/2024 1300 Gross per 24 hour  Intake 480 ml  Output 2950 ml  Net -2470 ml   Filed Weights   01/12/24 0712 01/13/24 0500 01/14/24 0529  Weight: 77.4 kg 82.5 kg 82.5 kg    Examination:  General exam: Appears calm and comfortable  Respiratory system: bibasilar rales, normal wob, no wheeze Cardiovascular system: S1 & S2 heard, RRR. No JVD, murmurs, rubs, gallops or clicks. trace pedal edema. Gastrointestinal system: Abdomen is nondistended, soft and nontender. No organomegaly or masses felt. Normal bowel sounds heard. Central nervous system: Alert and oriented. No focal neurological deficits. Extremities: Symmetric 5 x 5 power. Skin: No rashes, lesions or ulcers Psychiatry: calm     Data Reviewed: I have personally reviewed following labs and imaging studies  CBC: Recent Labs  Lab 01/09/24 1527 01/10/24 0504 01/11/24 0534 01/12/24 0418  WBC 12.5* 10.1 11.3* 10.1  NEUTROABS 10.1*  --   --   --   HGB 14.5 14.2 13.8 14.0  HCT 42.6 42.6 42.3 42.1  MCV 103.6* 104.2* 106.0* 104.7*  PLT 202 191 216 226   Basic Metabolic Panel: Recent Labs  Lab 01/10/24 0504 01/11/24 0534  01/12/24 0418 01/13/24 0505 01/14/24 0444  NA 144 141 140 139 138  K 3.7 3.8 3.7 3.9 3.8  CL 102 97* 97* 100 100  CO2 30 34* 33* 30 27  GLUCOSE 154* 121* 99 100* 110*  BUN 24* 34* 35* 26* 23  CREATININE 0.95 1.14* 1.13* 0.88 0.82  CALCIUM  8.3* 8.3* 8.2* 8.3* 8.3*   GFR: Estimated Creatinine Clearance: 45.1 mL/min (by C-G formula based on SCr of 0.82 mg/dL). Liver Function Tests: Recent Labs  Lab 01/09/24 1527  AST 30  ALT 41  ALKPHOS 74  BILITOT 1.5*  PROT 6.5  ALBUMIN 3.1*   Recent Labs  Lab 01/09/24 1527  LIPASE 58*   No results for input(s): AMMONIA in the last 168 hours. Coagulation Profile: No results for input(s): INR, PROTIME in the last 168 hours. Cardiac Enzymes: No results for input(s): CKTOTAL, CKMB, CKMBINDEX, TROPONINI in the last 168 hours. BNP (last 3 results) No results for input(s): PROBNP in the last 8760 hours. HbA1C: No results for  input(s): HGBA1C in the last 72 hours. CBG: No results for input(s): GLUCAP in the last 168 hours. Lipid Profile: No results for input(s): CHOL, HDL, LDLCALC, TRIG, CHOLHDL, LDLDIRECT in the last 72 hours. Thyroid  Function Tests: No results for input(s): TSH, T4TOTAL, FREET4, T3FREE, THYROIDAB in the last 72 hours. Anemia Panel: No results for input(s): VITAMINB12, FOLATE, FERRITIN, TIBC, IRON, RETICCTPCT in the last 72 hours. Urine analysis:    Component Value Date/Time   COLORURINE YELLOW (A) 01/09/2024 1630   APPEARANCEUR CLEAR (A) 01/09/2024 1630   LABSPEC 1.013 01/09/2024 1630   PHURINE 6.0 01/09/2024 1630   GLUCOSEU NEGATIVE 01/09/2024 1630   HGBUR NEGATIVE 01/09/2024 1630   BILIRUBINUR NEGATIVE 01/09/2024 1630   KETONESUR NEGATIVE 01/09/2024 1630   PROTEINUR NEGATIVE 01/09/2024 1630   NITRITE NEGATIVE 01/09/2024 1630   LEUKOCYTESUR NEGATIVE 01/09/2024 1630   Sepsis Labs: @LABRCNTIP (procalcitonin:4,lacticidven:4)  ) Recent Results (from the  past 240 hours)  Blood culture (single)     Status: None   Collection Time: 01/09/24  8:11 PM   Specimen: BLOOD  Result Value Ref Range Status   Specimen Description BLOOD BLOOD RIGHT ARM  Final   Special Requests   Final    Blood Culture results may not be optimal due to an inadequate volume of blood received in culture bottles BOTTLES DRAWN AEROBIC AND ANAEROBIC   Culture   Final    NO GROWTH 5 DAYS Performed at [facility], [address] [place], [place] [address]    Report Status 01/14/2024 FINAL  Final  Resp panel by RT-PCR (RSV, Flu A&B, Covid) Anterior Nasal Swab     Status: None   Collection Time: 01/09/24 11:17 PM   Specimen: Anterior Nasal Swab  Result Value Ref Range Status   SARS Coronavirus 2 by RT PCR NEGATIVE NEGATIVE Final    Comment: (NOTE) SARS-CoV-2 target nucleic acids are NOT DETECTED.  The SARS-CoV-2 RNA is generally detectable in upper respiratory specimens during the acute phase of infection. The lowest concentration of SARS-CoV-2 viral copies this assay can detect is 138 copies/mL. A negative result does not preclude SARS-Cov-2 infection and should not be used as the sole basis for treatment or other patient management decisions. A negative result may occur with  improper specimen collection/handling, submission of specimen other than nasopharyngeal swab, presence of viral mutation(s) within the areas targeted by this assay, and inadequate number of viral copies(<138 copies/mL). A negative result must be combined with clinical observations, patient history, and epidemiological information. The expected result is Negative.  Fact Sheet for Patients:  bloggercourse.com  Fact Sheet for Healthcare Providers:  seriousbroker.it  This test is no t yet approved or cleared by the [place]  FDA and  has been authorized for detection and/or diagnosis of SARS-CoV-2 by FDA under an Emergency Use  Authorization (EUA). This EUA will remain  in effect (meaning this test can be used) for the duration of the COVID-19 declaration under Section 564(b)(1) of the Act, 21 U.S.C.section 360bbb-3(b)(1), unless the authorization is terminated  or revoked sooner.       Influenza A by PCR NEGATIVE NEGATIVE Final   Influenza B by PCR NEGATIVE NEGATIVE Final    Comment: (NOTE) The Xpert Xpress SARS-CoV-2/FLU/RSV plus assay is intended as an aid in the diagnosis of influenza from Nasopharyngeal swab specimens and should not be used as a sole basis for treatment. Nasal washings and aspirates are unacceptable for Xpert Xpress SARS-CoV-2/FLU/RSV testing.  Fact Sheet for Patients: bloggercourse.com  Fact Sheet  for Healthcare Providers: seriousbroker.it  This test is not yet approved or cleared by the [place]  FDA and has been authorized for detection and/or diagnosis of SARS-CoV-2 by FDA under an Emergency Use Authorization (EUA). This EUA will remain in effect (meaning this test can be used) for the duration of the COVID-19 declaration under Section 564(b)(1) of the Act, 21 U.S.C. section 360bbb-3(b)(1), unless the authorization is terminated or revoked.     Resp Syncytial Virus by PCR NEGATIVE NEGATIVE Final    Comment: (NOTE) Fact Sheet for Patients: bloggercourse.com  Fact Sheet for Healthcare Providers: seriousbroker.it  This test is not yet approved or cleared by the [place]  FDA and has been authorized for detection and/or diagnosis of SARS-CoV-2 by FDA under an Emergency Use Authorization (EUA). This EUA will remain in effect (meaning this test can be used) for the duration of the COVID-19 declaration under Section 564(b)(1) of the Act, 21 U.S.C. section 360bbb-3(b)(1), unless the authorization is terminated or revoked.  Performed at [facility], [address] [place], [place] [address]   Respiratory (~20 pathogens) panel by PCR     Status: None   Collection Time: 01/10/24 11:50 AM   Specimen: Nasopharyngeal Swab; Respiratory  Result Value Ref Range Status   Adenovirus NOT DETECTED NOT DETECTED Final   Coronavirus 229E NOT DETECTED NOT DETECTED Final    Comment: (NOTE) The Coronavirus on the Respiratory Panel, DOES NOT test for the novel  Coronavirus (2019 nCoV)    Coronavirus HKU1 NOT DETECTED NOT DETECTED Final   Coronavirus NL63 NOT DETECTED NOT DETECTED Final   Coronavirus OC43 NOT DETECTED NOT DETECTED Final   Metapneumovirus NOT DETECTED NOT DETECTED Final   Rhinovirus / Enterovirus NOT DETECTED NOT DETECTED Final   Influenza A NOT DETECTED NOT DETECTED Final   Influenza B NOT DETECTED NOT DETECTED Final   Parainfluenza Virus 1 NOT DETECTED NOT DETECTED Final   Parainfluenza Virus 2 NOT DETECTED NOT DETECTED Final   Parainfluenza Virus 3 NOT DETECTED NOT DETECTED Final   Parainfluenza Virus 4 NOT DETECTED NOT DETECTED Final   Respiratory Syncytial Virus NOT DETECTED NOT DETECTED Final   Bordetella pertussis NOT DETECTED NOT DETECTED Final   Bordetella Parapertussis NOT DETECTED NOT DETECTED Final   Chlamydophila pneumoniae NOT DETECTED NOT DETECTED Final   Mycoplasma pneumoniae NOT DETECTED NOT DETECTED Final    Comment: Performed at [facility], [address] [place], [place] [address]         Radiology Studies: No results found.       Scheduled Meds:  apixaban   5 mg Oral BID   diltiazem   180 mg Oral Daily   furosemide   20 mg Oral Daily   guaiFENesin  600 mg Oral BID   metoprolol  succinate  50 mg Oral Daily   polyethylene glycol  34 g Oral Daily   Continuous Infusions:   LOS: 4 days     Devaughn KATHEE Ban, MD [facility]   If 7PM-7AM, please contact night-coverage [email or web address] Password [email or web address] 01/14/2024, 2:28 PM

## 2024-01-15 DIAGNOSIS — J9601 Acute respiratory failure with hypoxia: Secondary | ICD-10-CM | POA: Diagnosis not present

## 2024-01-15 NOTE — Progress Notes (Signed)
 PROGRESS NOTE    Morgan Jacobs  FMW:[id number] DOB: 11-Dec-1933 DOA: 01/09/2024 PCP: Montey Lot, PA-C      Brief Narrative:   Morgan Jacobs is a 89 y.o. female with medical history significant for HFrEF (EF 45 to 50%, 12/2022), pulmonary hypertension, A-fib on Eliquis , HTN, COPD, CAD s/p proximal LAD stent (2003), NASH,  TIA(07/2021), parotid mass noted in 12/2022, chronic back pain and hip pain with history of osteoporotic vertebral fracture being admitted with intractable back pain secondary to an acute T1 compression fracture, as well as a right lower lobe pneumonia seen on CT with new O2 requirement of 2 L. Patient was initially brought in by EMS with a several day history of worsening of her chronic right-sided low back pain causing her to stay in bed which is not normal for her.  Her decreased mobility was initially attributed to her burying her son recently.  Additionally, patient has had a cough for the past month, seeing her PCP on 2 occasions for which she was started on prednisone without improvement.  She has had no chest pain, lower extremity pain or swelling. In the ED afebrile, tachypneic to 22, normal pulse and blood pressure, O2 sat 88 on room air requiring 2 L to get to the mid to high 90s. Labs notable for WBC 12.5 with lactic acid 1.2.  BNP 954 Total bilirubin 1.5 UA unremarkable Respiratory viral panel not done EKG not done CT lumbar and thoracic spine showing acute compression fracture superior endplate of T11 without retropulsion and less than 10% vertebral body height loss with abnormal chronic findings CT T-spine also showed airspace opacity right lower lobe possibly asymmetric edema versus pneumonia as well as interstitial edema in the lungs with small bilateral pleural effusions Patient treated with a dose of IV Lasix  Started on Rocephin and azithromycin for community-acquired pneumonia The ED provider: Spoke with neurosurgeon, Dr. Penne Sharps who recommended no  TLSO brace due to possible pneumonia, acute intervention but will see in the morning.   Assessment & Plan:   Principal Problem:   Acute respiratory failure with hypoxia (HCC) Active Problems:   CAP (community acquired pneumonia)   Acute on chronic HFrEF (heart failure with reduced ejection fraction) (HCC)   COPD (chronic obstructive pulmonary disease) (HCC)   Non-traumatic compression fracture of T11 thoracic vertebra, initial encounter (HCC)   Intractable back pain   Ambulatory dysfunction   Obesity, Class III, BMI 40-49.9 (morbid obesity) (HCC)   Paroxysmal atrial fibrillation (HCC)   HTN (hypertension)   Stage 3a chronic kidney disease (CKD) (HCC)   History of osteoporotic pathological fracture   Fatty liver   History of TIA (transient ischemic attack)   Pulmonary hypertension (HCC)   CAD S/P proximal LAD stent (2003)   Acute on chronic congestive heart failure (HCC)   Thoracic compression fracture, closed, initial encounter (HCC)  # T11 compression fracture Nontraumatic. Seen by neurosurg, surgery deferred - patient declines tlso brace for comfort - pain control - pt/ot advising snf, toc consulted, has bed at [facility], auth now pending  # CHF with acute exacerbation Imaging consistent with pulm edema. Several rounds recent steroids, no wheeze. No fever or cough to suggest pna, does have mild leukocytosis. Covid/flu/rsv neg. TTE no sig changes. Rvp neg. Exacerbation resolved. - will hold on additional abx and steroids for now - resumed home lasix  - cont home metop    # Acute hypoxic respiratory failure 2/2 above, 89 in ER, resolved with 2 liters. Dyspneic on arrival.  Resolved - monitor  # CAD DES to LAD around 2003. No chest pain. No RWMAs on tte - home apixaban , metop - holding home asa, doesn't appear necessary  # COPD Do not think exacerbated as above  # A-fib Rate controlled - cont home metop, apixaban , diltiazem   # HTN controlled - home metop,  dilt - hold home losartan for now  # CKD 3a vs ckd 2 At baseline, kidney function actually improved to gfr greater than 60 now - monitor   DVT prophylaxis: apixaban  Code Status: dnr/dni, confirmed with daughter at bedside on 10/23 Family Communication: daughter at bedside 10/28  Level of care: Telemetry Medical Status is: inpt     Consultants:  neurosurgery  Procedures: none  Antimicrobials:  See above    Subjective: Reports no back pain at rest, tolerating diet, bm today  Objective: Vitals:   01/15/24 0112 01/15/24 0335 01/15/24 0500 01/15/24 0718  BP:  113/62  108/78  Pulse: 62 90  66  Resp:  18  17  Temp:  98 F (36.7 C)  97.7 F (36.5 C)  TempSrc:      SpO2: 95% 96%  92%  Weight:   79.3 kg   Height:        Intake/Output Summary (Last 24 hours) at 01/15/2024 1410 Last data filed at 01/15/2024 1048 Gross per 24 hour  Intake 240 ml  Output 400 ml  Net -160 ml   Filed Weights   01/13/24 0500 01/14/24 0529 01/15/24 0500  Weight: 82.5 kg 82.5 kg 79.3 kg    Examination:  General exam: Appears calm and comfortable  Respiratory system: bibasilar rales, normal wob, no wheeze Cardiovascular system: S1 & S2 heard, RRR. No JVD, murmurs, rubs, gallops or clicks. trace pedal edema. Gastrointestinal system: Abdomen is nondistended, soft and nontender. No organomegaly or masses felt. Normal bowel sounds heard. Central nervous system: Alert and oriented. No focal neurological deficits. Extremities: Symmetric 5 x 5 power. Skin: No rashes, lesions or ulcers Psychiatry: calm     Data Reviewed: I have personally reviewed following labs and imaging studies  CBC: Recent Labs  Lab 01/09/24 1527 01/10/24 0504 01/11/24 0534 01/12/24 0418  WBC 12.5* 10.1 11.3* 10.1  NEUTROABS 10.1*  --   --   --   HGB 14.5 14.2 13.8 14.0  HCT 42.6 42.6 42.3 42.1  MCV 103.6* 104.2* 106.0* 104.7*  PLT 202 191 216 226   Basic Metabolic Panel: Recent Labs  Lab  01/10/24 0504 01/11/24 0534 01/12/24 0418 01/13/24 0505 01/14/24 0444  NA 144 141 140 139 138  K 3.7 3.8 3.7 3.9 3.8  CL 102 97* 97* 100 100  CO2 30 34* 33* 30 27  GLUCOSE 154* 121* 99 100* 110*  BUN 24* 34* 35* 26* 23  CREATININE 0.95 1.14* 1.13* 0.88 0.82  CALCIUM  8.3* 8.3* 8.2* 8.3* 8.3*   GFR: Estimated Creatinine Clearance: 44.2 mL/min (by C-G formula based on SCr of 0.82 mg/dL). Liver Function Tests: Recent Labs  Lab 01/09/24 1527  AST 30  ALT 41  ALKPHOS 74  BILITOT 1.5*  PROT 6.5  ALBUMIN 3.1*   Recent Labs  Lab 01/09/24 1527  LIPASE 58*   No results for input(s): AMMONIA in the last 168 hours. Coagulation Profile: No results for input(s): INR, PROTIME in the last 168 hours. Cardiac Enzymes: No results for input(s): CKTOTAL, CKMB, CKMBINDEX, TROPONINI in the last 168 hours. BNP (last 3 results) No results for input(s): PROBNP in the last 8760 hours. HbA1C:  No results for input(s): HGBA1C in the last 72 hours. CBG: No results for input(s): GLUCAP in the last 168 hours. Lipid Profile: No results for input(s): CHOL, HDL, LDLCALC, TRIG, CHOLHDL, LDLDIRECT in the last 72 hours. Thyroid  Function Tests: No results for input(s): TSH, T4TOTAL, FREET4, T3FREE, THYROIDAB in the last 72 hours. Anemia Panel: No results for input(s): VITAMINB12, FOLATE, FERRITIN, TIBC, IRON, RETICCTPCT in the last 72 hours. Urine analysis:    Component Value Date/Time   COLORURINE YELLOW (A) 01/09/2024 1630   APPEARANCEUR CLEAR (A) 01/09/2024 1630   LABSPEC 1.013 01/09/2024 1630   PHURINE 6.0 01/09/2024 1630   GLUCOSEU NEGATIVE 01/09/2024 1630   HGBUR NEGATIVE 01/09/2024 1630   BILIRUBINUR NEGATIVE 01/09/2024 1630   KETONESUR NEGATIVE 01/09/2024 1630   PROTEINUR NEGATIVE 01/09/2024 1630   NITRITE NEGATIVE 01/09/2024 1630   LEUKOCYTESUR NEGATIVE 01/09/2024 1630   Sepsis  Labs: @LABRCNTIP (procalcitonin:4,lacticidven:4)  ) Recent Results (from the past 240 hours)  Blood culture (single)     Status: None   Collection Time: 01/09/24  8:11 PM   Specimen: BLOOD  Result Value Ref Range Status   Specimen Description BLOOD BLOOD RIGHT ARM  Final   Special Requests   Final    Blood Culture results may not be optimal due to an inadequate volume of blood received in culture bottles BOTTLES DRAWN AEROBIC AND ANAEROBIC   Culture   Final    NO GROWTH 5 DAYS Performed at [facility], [address] [place], [place] [address]    Report Status 01/14/2024 FINAL  Final  Resp panel by RT-PCR (RSV, Flu A&B, Covid) Anterior Nasal Swab     Status: None   Collection Time: 01/09/24 11:17 PM   Specimen: Anterior Nasal Swab  Result Value Ref Range Status   SARS Coronavirus 2 by RT PCR NEGATIVE NEGATIVE Final    Comment: (NOTE) SARS-CoV-2 target nucleic acids are NOT DETECTED.  The SARS-CoV-2 RNA is generally detectable in upper respiratory specimens during the acute phase of infection. The lowest concentration of SARS-CoV-2 viral copies this assay can detect is 138 copies/mL. A negative result does not preclude SARS-Cov-2 infection and should not be used as the sole basis for treatment or other patient management decisions. A negative result may occur with  improper specimen collection/handling, submission of specimen other than nasopharyngeal swab, presence of viral mutation(s) within the areas targeted by this assay, and inadequate number of viral copies(<138 copies/mL). A negative result must be combined with clinical observations, patient history, and epidemiological information. The expected result is Negative.  Fact Sheet for Patients:  bloggercourse.com  Fact Sheet for Healthcare Providers:  seriousbroker.it  This test is no t yet approved or cleared by the [place]  FDA and  has been authorized  for detection and/or diagnosis of SARS-CoV-2 by FDA under an Emergency Use Authorization (EUA). This EUA will remain  in effect (meaning this test can be used) for the duration of the COVID-19 declaration under Section 564(b)(1) of the Act, 21 U.S.C.section 360bbb-3(b)(1), unless the authorization is terminated  or revoked sooner.       Influenza A by PCR NEGATIVE NEGATIVE Final   Influenza B by PCR NEGATIVE NEGATIVE Final    Comment: (NOTE) The Xpert Xpress SARS-CoV-2/FLU/RSV plus assay is intended as an aid in the diagnosis of influenza from Nasopharyngeal swab specimens and should not be used as a sole basis for treatment. Nasal washings and aspirates are unacceptable for Xpert Xpress SARS-CoV-2/FLU/RSV testing.  Fact Sheet for Patients: bloggercourse.com  Fact Sheet for Healthcare Providers: seriousbroker.it  This test is not yet approved or cleared by the [place]  FDA and has been authorized for detection and/or diagnosis of SARS-CoV-2 by FDA under an Emergency Use Authorization (EUA). This EUA will remain in effect (meaning this test can be used) for the duration of the COVID-19 declaration under Section 564(b)(1) of the Act, 21 U.S.C. section 360bbb-3(b)(1), unless the authorization is terminated or revoked.     Resp Syncytial Virus by PCR NEGATIVE NEGATIVE Final    Comment: (NOTE) Fact Sheet for Patients: bloggercourse.com  Fact Sheet for Healthcare Providers: seriousbroker.it  This test is not yet approved or cleared by the [place]  FDA and has been authorized for detection and/or diagnosis of SARS-CoV-2 by FDA under an Emergency Use Authorization (EUA). This EUA will remain in effect (meaning this test can be used) for the duration of the COVID-19 declaration under Section 564(b)(1) of the Act, 21 U.S.C. section 360bbb-3(b)(1), unless the authorization is  terminated or revoked.  Performed at [facility], [address] [place], [place] [address]   Respiratory (~20 pathogens) panel by PCR     Status: None   Collection Time: 01/10/24 11:50 AM   Specimen: Nasopharyngeal Swab; Respiratory  Result Value Ref Range Status   Adenovirus NOT DETECTED NOT DETECTED Final   Coronavirus 229E NOT DETECTED NOT DETECTED Final    Comment: (NOTE) The Coronavirus on the Respiratory Panel, DOES NOT test for the novel  Coronavirus (2019 nCoV)    Coronavirus HKU1 NOT DETECTED NOT DETECTED Final   Coronavirus NL63 NOT DETECTED NOT DETECTED Final   Coronavirus OC43 NOT DETECTED NOT DETECTED Final   Metapneumovirus NOT DETECTED NOT DETECTED Final   Rhinovirus / Enterovirus NOT DETECTED NOT DETECTED Final   Influenza A NOT DETECTED NOT DETECTED Final   Influenza B NOT DETECTED NOT DETECTED Final   Parainfluenza Virus 1 NOT DETECTED NOT DETECTED Final   Parainfluenza Virus 2 NOT DETECTED NOT DETECTED Final   Parainfluenza Virus 3 NOT DETECTED NOT DETECTED Final   Parainfluenza Virus 4 NOT DETECTED NOT DETECTED Final   Respiratory Syncytial Virus NOT DETECTED NOT DETECTED Final   Bordetella pertussis NOT DETECTED NOT DETECTED Final   Bordetella Parapertussis NOT DETECTED NOT DETECTED Final   Chlamydophila pneumoniae NOT DETECTED NOT DETECTED Final   Mycoplasma pneumoniae NOT DETECTED NOT DETECTED Final    Comment: Performed at [facility], [address], [place], [place] [address]         Radiology Studies: No results found.       Scheduled Meds:  apixaban   5 mg Oral BID   diltiazem   180 mg Oral Daily   furosemide   20 mg Oral Daily   guaiFENesin  600 mg Oral BID   metoprolol  succinate  50 mg Oral Daily   polyethylene glycol  34 g Oral Daily   Continuous Infusions:   LOS: 5 days     Devaughn KATHEE Ban, MD [facility]   If 7PM-7AM, please contact night-coverage [email or web address] Password [email or web address] 01/15/2024, 2:10 PM

## 2024-01-15 NOTE — Plan of Care (Signed)

## 2024-01-15 NOTE — Progress Notes (Signed)
 Mobility Specialist - Progress Note    01/15/24 1400  Mobility  Activity Ambulated with assistance;Stood at bedside  Level of Assistance Contact guard assist, steadying assist  Assistive Device Front wheel walker  Distance Ambulated (ft) 10 ft  Range of Motion/Exercises Active  Activity Response Tolerated well  Mobility Referral Yes  Mobility visit 1 Mobility  Mobility Specialist Start Time (ACUTE ONLY) 1356  Mobility Specialist Stop Time (ACUTE ONLY) 1405  Mobility Specialist Time Calculation (min) (ACUTE ONLY) 9 min   Pt resting in recliner on RA upon entry. Pt endorses feeling very tired from sitting in recliner. Pt STS and ambulates to CGA corner of sink and back to bed declining further mobility due to fatigue. Pt left in bed with needs in reach and bed alarm activated.   Guido Rumble Mobility Specialist 01/15/24, 2:06 PM

## 2024-01-16 DIAGNOSIS — S22080A Wedge compression fracture of T11-T12 vertebra, initial encounter for closed fracture: Secondary | ICD-10-CM | POA: Diagnosis not present

## 2024-01-16 NOTE — Progress Notes (Signed)
 PT Cancellation Note  Patient Details Name: Isaura Schiller MRN: [id number] DOB: 03-Sep-1933   Cancelled Treatment:     Attempted PT x 2 today, pt politely declined due to fatigue. Will re-attempt next available date per POC.     Darice JAYSON Bohr 01/16/2024, 2:00 PM

## 2024-01-16 NOTE — TOC Progression Note (Signed)
 Transition of Care [facility]) - Progression Note    Patient Details  Name: Morgan Jacobs MRN: [id number] Date of Birth: September 12, 1933  Transition of Care [facility] CM/SW Contact  Alvaro Louder, [place] Phone Number: 01/16/2024, 1:41 PM  Clinical Narrative:   Shara is still currently pending at [facility]. Per [place] [phone]), [facility] coordinator.   TOC to follow for discharge                    Expected Discharge Plan and Services                                               Social Drivers of Health (SDOH) Interventions SDOH Screenings   Food Insecurity: No Food Insecurity (01/10/2024)  Housing: Unknown (01/10/2024)  Transportation Needs: No Transportation Needs (01/09/2024)  Utilities: Not At Risk (01/09/2024)  Financial Resource Strain: Low Risk  (01/10/2024)  Tobacco Use: High Risk (01/10/2024)    Readmission Risk Interventions     No data to display

## 2024-01-16 NOTE — Progress Notes (Signed)
 Occupational Therapy Treatment Patient Details Name: Morgan Jacobs MRN: [id number] DOB: 1933/10/09 Today's Date: 01/16/2024   History of present illness Pt is an 89 y/o F admitted on 01/09/24 after presenting with c/o intractable back pain 2/2 acute T1 compression fx. Also noted to have R lower lobe PNA with new O2 requirement. PMH: HFrEF, pulmonary HTN, a-fib on Eliquis , HTN, COPD, CAD s/p proximal LAD stent (2003), NASH, TIA (07/2021), parotid mass (12/2022), chronic back pain & hip pain, osteoporotic vertebral fx   OT comments  Upon entering the room, pt supine in bed and agreeable to OT intervention. Pt needing to use bathroom with urgency. Log roll to supine with min A for trunk support. Pt stands with min A and use of RW. Step pivot with RW and min guard to BSC. Pt able to have BM and urinate but needing assistance with hygiene. Pt then stands and ambulates 10' to recliner chair with CGA. Pt seated with all needs within reach. Pt tolerates B UE's being elevated in chair. Chair alarm activated. Pt making progress towards goals. Pt does endorse pain with movement but does not request pain medication when asked.       If plan is discharge home, recommend the following:  A little help with walking and/or transfers;A little help with bathing/dressing/bathroom;Assistance with cooking/housework;Assist for transportation;Help with stairs or ramp for entrance   Equipment Recommendations  Other (comment) (defer to next venue of care)       Precautions / Restrictions Precautions Precautions: Fall;Back Precaution/Restrictions Comments: per neurosurgery note:she may feel better with the TLSO brace as this could be supportive across the junction of her fracture pt currently declining Restrictions Weight Bearing Restrictions Per Provider Order: No       Mobility Bed Mobility Overal bed mobility: Needs Assistance Bed Mobility: Sidelying to Sit   Sidelying to sit: Min assist             Transfers Overall transfer level: Needs assistance Equipment used: Rolling walker (2 wheels) Transfers: Sit to/from Stand Sit to Stand: Min assist                 Balance Overall balance assessment: Needs assistance Sitting-balance support: Feet supported Sitting balance-Leahy Scale: Good     Standing balance support: Bilateral upper extremity supported, During functional activity, Reliant on assistive device for balance Standing balance-Leahy Scale: Fair                             ADL either performed or assessed with clinical judgement   ADL Overall ADL's : Needs assistance/impaired     Grooming: Sitting;Set up;Supervision/safety;Wash/dry face;Wash/dry Programmer, Applications Details (indicate cue type and reason): sitting on [facility]                 Toilet Transfer: Rolling walker (2 wheels);Ambulation;Contact guard assist;BSC/3in1   Toileting- Clothing Manipulation and Hygiene: Minimal assistance;Sit to/from stand       Functional mobility during ADLs: Rolling walker (2 wheels);Contact guard assist      Extremity/Trunk Assessment Upper Extremity Assessment Upper Extremity Assessment: Generalized weakness   Lower Extremity Assessment Lower Extremity Assessment: Generalized weakness        Vision Patient Visual Report: No change from baseline           Communication Communication Communication: No apparent difficulties   Cognition Arousal: Alert Behavior During Therapy: WFL for tasks assessed/performed Cognition: No apparent impairments  Following commands: Intact        Cueing   Cueing Techniques: Verbal cues             Pertinent Vitals/ Pain       Pain Assessment Pain Assessment: Faces Faces Pain Scale: Hurts little more Pain Location: back with mobility Pain Descriptors / Indicators: Discomfort, Grimacing, Aching Pain Intervention(s): Limited activity within patient's tolerance,  Monitored during session, Repositioned         Frequency  Min 2X/week        Progress Toward Goals  OT Goals(current goals can now be found in the care plan section)  Progress towards OT goals: Progressing toward goals      AM-PAC OT 6 Clicks Daily Activity     Outcome Measure   Help from another person eating meals?: None Help from another person taking care of personal grooming?: None Help from another person toileting, which includes using toliet, bedpan, or urinal?: A Lot Help from another person bathing (including washing, rinsing, drying)?: A Lot Help from another person to put on and taking off regular upper body clothing?: A Little Help from another person to put on and taking off regular lower body clothing?: A Lot 6 Click Score: 17    End of Session Equipment Utilized During Treatment: Rolling walker (2 wheels)  OT Visit Diagnosis: Other abnormalities of gait and mobility (R26.89);Muscle weakness (generalized) (M62.81)   Activity Tolerance Patient tolerated treatment well   Patient Left in chair;with call bell/phone within reach;with chair alarm set;with family/visitor present   Nurse Communication Mobility status        Time: 9081-9063 OT Time Calculation (min): 18 min  Charges: OT General Charges $OT Visit: 1 Visit OT Treatments $Self Care/Home Management : 8-22 mins  Izetta Claude, MS, OTR/L , CBIS ascom [phone]  01/16/24, 10:43 AM

## 2024-01-16 NOTE — Plan of Care (Signed)

## 2024-01-16 NOTE — Progress Notes (Signed)
 PROGRESS NOTE    Morgan Jacobs  FMW:[id number] DOB: 10/07/1933 DOA: 01/09/2024 PCP: Montey Lot, PA-C   Assessment & Plan:   Principal Problem:   Acute respiratory failure with hypoxia (HCC) Active Problems:   CAP (community acquired pneumonia)   Acute on chronic HFrEF (heart failure with reduced ejection fraction) (HCC)   COPD (chronic obstructive pulmonary disease) (HCC)   Non-traumatic compression fracture of T11 thoracic vertebra, initial encounter (HCC)   Intractable back pain   Ambulatory dysfunction   Obesity, Class III, BMI 40-49.9 (morbid obesity) (HCC)   Paroxysmal atrial fibrillation (HCC)   HTN (hypertension)   Stage 3a chronic kidney disease (CKD) (HCC)   History of osteoporotic pathological fracture   Fatty liver   History of TIA (transient ischemic attack)   Pulmonary hypertension (HCC)   CAD S/P proximal LAD stent (2003)   Acute on chronic congestive heart failure (HCC)   Thoracic compression fracture, closed, initial encounter (HCC)  Assessment and Plan: T11 compression fracture: nontraumatic. Pt declines TLSO brace. PT recs SNF. Waiting on SNF placement still. Surgery deferred. Neuro surg recs apprec    Acute systolic CHF: echo shows EF 50-55%, diastolic function is indeterminate, no regional wall motion abnormalities. Continue on home dose of lasix , metoprolol . Monitor I/Os. Exacerbation resolved  Acute hypoxic respiratory failure: likely secondary to CHF. 88% on RA in ER. Weaned off of supplemental oxygen. Resolved    Hx of CAD: w/ DES to LAD approx in 2003. Continue on home dose of eliquis , metoprolol . D/c home aspirin   Hx of COPD: w/o exacerbation. Bronchodilators prn   A-fib: likely PAF. Continue on home dose of metoprolol , diltiazem , eliquis    HTN: continue on home dose of metoprolol , diltiazem . Holding home dose of losartan   CKDII: baseline Cr is unclear. Cr is trending down        DVT prophylaxis: eliquis  Code Status: DNR Family  Communication: Disposition Plan: waiting on insurance auth still   Level of care: Telemetry Medical Status is: Inpatient Remains inpatient appropriate because: waiting on insurance auth     Consultants:  Neuro surg   Procedures:   Antimicrobials:   Subjective: Pt c/o fatigue   Objective: Vitals:   01/16/24 0454 01/16/24 0532 01/16/24 0729 01/16/24 0735  BP:  (!) 114/56 116/60   Pulse:  (!) 47 (!) 51 66  Resp:  17 18   Temp:  98 F (36.7 C) 98.4 F (36.9 C)   TempSrc:   Oral   SpO2:  96% 94%   Weight: 79.6 kg     Height:        Intake/Output Summary (Last 24 hours) at 01/16/2024 0942 Last data filed at 01/16/2024 0735 Gross per 24 hour  Intake 240 ml  Output 1580 ml  Net -1340 ml   Filed Weights   01/14/24 0529 01/15/24 0500 01/16/24 0454  Weight: 82.5 kg 79.3 kg 79.6 kg    Examination:  General exam: Appears calm and comfortable  Respiratory system: Clear to auscultation. Respiratory effort normal. Cardiovascular system: S1 & S2 +. No  rubs, gallops or clicks. Gastrointestinal system: Abdomen is nondistended, soft and nontender. Normal bowel sounds heard. Central nervous system: Alert and oriented. Moves all extremities Psychiatry: Judgement and insight appears at baseline. Flat mood and affect    Data Reviewed: I have personally reviewed following labs and imaging studies  CBC: Recent Labs  Lab 01/09/24 1527 01/10/24 0504 01/11/24 0534 01/12/24 0418  WBC 12.5* 10.1 11.3* 10.1  NEUTROABS 10.1*  --   --   --  HGB 14.5 14.2 13.8 14.0  HCT 42.6 42.6 42.3 42.1  MCV 103.6* 104.2* 106.0* 104.7*  PLT 202 191 216 226   Basic Metabolic Panel: Recent Labs  Lab 01/10/24 0504 01/11/24 0534 01/12/24 0418 01/13/24 0505 01/14/24 0444  NA 144 141 140 139 138  K 3.7 3.8 3.7 3.9 3.8  CL 102 97* 97* 100 100  CO2 30 34* 33* 30 27  GLUCOSE 154* 121* 99 100* 110*  BUN 24* 34* 35* 26* 23  CREATININE 0.95 1.14* 1.13* 0.88 0.82  CALCIUM  8.3* 8.3* 8.2*  8.3* 8.3*   GFR: Estimated Creatinine Clearance: 44.2 mL/min (by C-G formula based on SCr of 0.82 mg/dL). Liver Function Tests: Recent Labs  Lab 01/09/24 1527  AST 30  ALT 41  ALKPHOS 74  BILITOT 1.5*  PROT 6.5  ALBUMIN 3.1*   Recent Labs  Lab 01/09/24 1527  LIPASE 58*   No results for input(s): AMMONIA in the last 168 hours. Coagulation Profile: No results for input(s): INR, PROTIME in the last 168 hours. Cardiac Enzymes: No results for input(s): CKTOTAL, CKMB, CKMBINDEX, TROPONINI in the last 168 hours. BNP (last 3 results) No results for input(s): PROBNP in the last 8760 hours. HbA1C: No results for input(s): HGBA1C in the last 72 hours. CBG: No results for input(s): GLUCAP in the last 168 hours. Lipid Profile: No results for input(s): CHOL, HDL, LDLCALC, TRIG, CHOLHDL, LDLDIRECT in the last 72 hours. Thyroid  Function Tests: No results for input(s): TSH, T4TOTAL, FREET4, T3FREE, THYROIDAB in the last 72 hours. Anemia Panel: No results for input(s): VITAMINB12, FOLATE, FERRITIN, TIBC, IRON, RETICCTPCT in the last 72 hours. Sepsis Labs: Recent Labs  Lab 01/09/24 2011 01/09/24 2224  LATICACIDVEN 1.2 2.0*    Recent Results (from the past 240 hours)  Blood culture (single)     Status: None   Collection Time: 01/09/24  8:11 PM   Specimen: BLOOD  Result Value Ref Range Status   Specimen Description BLOOD BLOOD RIGHT ARM  Final   Special Requests   Final    Blood Culture results may not be optimal due to an inadequate volume of blood received in culture bottles BOTTLES DRAWN AEROBIC AND ANAEROBIC   Culture   Final    NO GROWTH 5 DAYS Performed at [facility], [address] [place], [place] [address]    Report Status 01/14/2024 FINAL  Final  Resp panel by RT-PCR (RSV, Flu A&B, Covid) Anterior Nasal Swab     Status: None   Collection Time: 01/09/24 11:17 PM   Specimen: Anterior Nasal Swab  Result  Value Ref Range Status   SARS Coronavirus 2 by RT PCR NEGATIVE NEGATIVE Final    Comment: (NOTE) SARS-CoV-2 target nucleic acids are NOT DETECTED.  The SARS-CoV-2 RNA is generally detectable in upper respiratory specimens during the acute phase of infection. The lowest concentration of SARS-CoV-2 viral copies this assay can detect is 138 copies/mL. A negative result does not preclude SARS-Cov-2 infection and should not be used as the sole basis for treatment or other patient management decisions. A negative result may occur with  improper specimen collection/handling, submission of specimen other than nasopharyngeal swab, presence of viral mutation(s) within the areas targeted by this assay, and inadequate number of viral copies(<138 copies/mL). A negative result must be combined with clinical observations, patient history, and epidemiological information. The expected result is Negative.  Fact Sheet for Patients:  bloggercourse.com  Fact Sheet for Healthcare Providers:  seriousbroker.it  This test is no  t yet approved or cleared by the [place]  FDA and  has been authorized for detection and/or diagnosis of SARS-CoV-2 by FDA under an Emergency Use Authorization (EUA). This EUA will remain  in effect (meaning this test can be used) for the duration of the COVID-19 declaration under Section 564(b)(1) of the Act, 21 U.S.C.section 360bbb-3(b)(1), unless the authorization is terminated  or revoked sooner.       Influenza A by PCR NEGATIVE NEGATIVE Final   Influenza B by PCR NEGATIVE NEGATIVE Final    Comment: (NOTE) The Xpert Xpress SARS-CoV-2/FLU/RSV plus assay is intended as an aid in the diagnosis of influenza from Nasopharyngeal swab specimens and should not be used as a sole basis for treatment. Nasal washings and aspirates are unacceptable for Xpert Xpress SARS-CoV-2/FLU/RSV testing.  Fact Sheet for  Patients: bloggercourse.com  Fact Sheet for Healthcare Providers: seriousbroker.it  This test is not yet approved or cleared by the [place]  FDA and has been authorized for detection and/or diagnosis of SARS-CoV-2 by FDA under an Emergency Use Authorization (EUA). This EUA will remain in effect (meaning this test can be used) for the duration of the COVID-19 declaration under Section 564(b)(1) of the Act, 21 U.S.C. section 360bbb-3(b)(1), unless the authorization is terminated or revoked.     Resp Syncytial Virus by PCR NEGATIVE NEGATIVE Final    Comment: (NOTE) Fact Sheet for Patients: bloggercourse.com  Fact Sheet for Healthcare Providers: seriousbroker.it  This test is not yet approved or cleared by the [place]  FDA and has been authorized for detection and/or diagnosis of SARS-CoV-2 by FDA under an Emergency Use Authorization (EUA). This EUA will remain in effect (meaning this test can be used) for the duration of the COVID-19 declaration under Section 564(b)(1) of the Act, 21 U.S.C. section 360bbb-3(b)(1), unless the authorization is terminated or revoked.  Performed at [facility], [address] [place], [place] [address]   Respiratory (~20 pathogens) panel by PCR     Status: None   Collection Time: 01/10/24 11:50 AM   Specimen: Nasopharyngeal Swab; Respiratory  Result Value Ref Range Status   Adenovirus NOT DETECTED NOT DETECTED Final   Coronavirus 229E NOT DETECTED NOT DETECTED Final    Comment: (NOTE) The Coronavirus on the Respiratory Panel, DOES NOT test for the novel  Coronavirus (2019 nCoV)    Coronavirus HKU1 NOT DETECTED NOT DETECTED Final   Coronavirus NL63 NOT DETECTED NOT DETECTED Final   Coronavirus OC43 NOT DETECTED NOT DETECTED Final   Metapneumovirus NOT DETECTED NOT DETECTED Final   Rhinovirus / Enterovirus NOT DETECTED NOT  DETECTED Final   Influenza A NOT DETECTED NOT DETECTED Final   Influenza B NOT DETECTED NOT DETECTED Final   Parainfluenza Virus 1 NOT DETECTED NOT DETECTED Final   Parainfluenza Virus 2 NOT DETECTED NOT DETECTED Final   Parainfluenza Virus 3 NOT DETECTED NOT DETECTED Final   Parainfluenza Virus 4 NOT DETECTED NOT DETECTED Final   Respiratory Syncytial Virus NOT DETECTED NOT DETECTED Final   Bordetella pertussis NOT DETECTED NOT DETECTED Final   Bordetella Parapertussis NOT DETECTED NOT DETECTED Final   Chlamydophila pneumoniae NOT DETECTED NOT DETECTED Final   Mycoplasma pneumoniae NOT DETECTED NOT DETECTED Final    Comment: Performed at [facility], [address], [place], [place] [address]         Radiology Studies: No results found.      Scheduled Meds:  apixaban   5 mg Oral BID   diltiazem   180 mg  Oral Daily   furosemide   20 mg Oral Daily   guaiFENesin  600 mg Oral BID   metoprolol  succinate  50 mg Oral Daily   polyethylene glycol  34 g Oral Daily   Continuous Infusions:   LOS: 6 days    Anthony CHRISTELLA Pouch, MD [facility] Pager [phone]  If 7PM-7AM, please contact night-coverage [email or web address] 01/16/2024, 9:42 AM

## 2024-01-17 DIAGNOSIS — S22080A Wedge compression fracture of T11-T12 vertebra, initial encounter for closed fracture: Secondary | ICD-10-CM | POA: Diagnosis not present

## 2024-01-17 LAB — BASIC METABOLIC PANEL WITH GFR
Anion gap: 9 (ref 5–15)
BUN: 21 mg/dL (ref 8–23)
CO2: 26 mmol/L (ref 22–32)
Calcium: 8.6 mg/dL — ABNORMAL LOW (ref 8.9–10.3)
Chloride: 104 mmol/L (ref 98–111)
Creatinine, Ser: 0.91 mg/dL (ref 0.44–1.00)
GFR, Estimated: >60 mL/min (ref >60)
Glucose, Bld: 106 mg/dL — ABNORMAL HIGH (ref 70–99)
Potassium: 4 mmol/L (ref 3.5–5.1)
Sodium: 139 mmol/L (ref 135–145)

## 2024-01-17 LAB — CBC
HCT: 43.4 % (ref 36.0–46.0)
Hemoglobin: 14.3 g/dL (ref 12.0–15.0)
MCH: 34.7 pg — ABNORMAL HIGH (ref 26.0–34.0)
MCHC: 32.9 g/dL (ref 30.0–36.0)
MCV: 105.3 fL — ABNORMAL HIGH (ref 80.0–100.0)
Platelets: 196 K/uL (ref 150–400)
RBC: 4.12 MIL/uL (ref 3.87–5.11)
RDW: 11.8 % (ref 11.5–15.5)
WBC: 8.3 K/uL (ref 4.0–10.5)
nRBC: 0 % (ref 0.0–0.2)

## 2024-01-17 NOTE — Progress Notes (Signed)
 Physical Therapy Treatment Patient Details Name: Morgan Jacobs MRN: [id number] DOB: 07-Feb-1934 Today's Date: 01/17/2024   History of Present Illness Pt is an 89 y/o F admitted on 01/09/24 after presenting with c/o intractable back pain 2/2 acute T1 compression fx. Also noted to have R lower lobe PNA with new O2 requirement. 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 Educated pt and daughter on acute T11 fx and worsening L4 & L5 chronic compression fx's, benefits of TLSO however pt refusing. Handouts given on log roll technique and spinal precautions. Pt required cues for proper log roll technique. Increased back pain upon sitting, MinA to raise to standing at RW. Pt completed gait training with chair follow incase of fatigue/buckling. Heavy reliance on RW to off load pressure through spine. Pt positioned to comfort in chair with all needs met.   If plan is discharge home, recommend the following: A little help with walking and/or transfers;A little help with bathing/dressing/bathroom;Assistance with cooking/housework;Assist for transportation;Help with stairs or ramp for entrance   Can travel by private vehicle     Yes  Equipment Recommendations  None recommended by PT (Defer to next level of care)    Recommendations for Other Services       Precautions / Restrictions Precautions Precautions: Fall;Back Precaution Booklet Issued: Yes (comment) Recall of Precautions/Restrictions: Intact Precaution/Restrictions Comments: per neurosurgery note:she may feel better with the TLSO brace as this could be supportive across the junction of her fracture pt currently declining Restrictions Weight Bearing Restrictions Per Provider Order: No Other Position/Activity Restrictions:  (Log Roll)      Mobility  Bed Mobility Overal bed mobility: Needs Assistance Bed Mobility: Rolling, Sidelying to Sit Rolling: Contact guard assist, Used rails Sidelying to sit: Min assist       General bed mobility comments:  (Repeated cues for log roll technique)    Transfers Overall transfer level: Needs assistance Equipment used: Rolling walker (2 wheels) Transfers: Sit to/from Stand Sit to Stand: Min assist           General transfer comment:  (Cues for hand placement)    Ambulation/Gait Ambulation/Gait assistance: Contact guard assist Gait Distance (Feet): 100 Feet Assistive device: Rolling walker (2 wheels) Gait Pattern/deviations: Decreased step length - right, Decreased step length - left, Decreased stride length Gait velocity: decreased     General Gait Details: leans on walker handles, increases when fatigued   Stairs             Wheelchair Mobility     Tilt Bed    Modified Rankin (Stroke Patients Only)       Balance Overall balance assessment: Needs assistance Sitting-balance support: Feet supported Sitting balance-Leahy Scale: Good     Standing balance support: Bilateral upper extremity supported, During functional activity, Reliant on assistive device for balance Standing balance-Leahy Scale: Fair Standing balance comment:  (Heavy reliance on RW)                            Communication Communication Communication: No apparent difficulties  Cognition Arousal: Alert Behavior During Therapy: WFL for tasks assessed/performed   PT - Cognitive impairments: Safety/Judgement, Awareness  Following commands: Intact      Cueing Cueing Techniques: Verbal cues  Exercises      General Comments General comments (skin integrity, edema, etc.):  (Pt and daughter educated on role of acute PT, recs for TLSO donning, log roll technique, recs from Neurosurgery and d/c plans)      Pertinent Vitals/Pain Pain  Assessment Pain Assessment: Faces Faces Pain Scale: Hurts little more Pain Location: back with mobility Pain Descriptors / Indicators: Discomfort, Grimacing, Aching Pain Intervention(s): Patient requesting pain meds-RN notified    Home Living                          Prior Function            PT Goals (current goals can now be found in the care plan section) Acute Rehab PT Goals Patient Stated Goal: decreased pain, go home Progress towards PT goals: Progressing toward goals    Frequency    Min 2X/week      PT Plan      Co-evaluation              AM-PAC PT 6 Clicks Mobility   Outcome Measure  Help needed turning from your back to your side while in a flat bed without using bedrails?: A Little Help needed moving from lying on your back to sitting on the side of a flat bed without using bedrails?: A Little Help needed moving to and from a bed to a chair (including a wheelchair)?: A Little Help needed standing up from a chair using your arms (e.g., wheelchair or bedside chair)?: A Little Help needed to walk in hospital room?: A Little Help needed climbing 3-5 steps with a railing? : A Lot 6 Click Score: 17    End of Session Equipment Utilized During Treatment: Gait belt Activity Tolerance: Patient limited by pain Patient left: in chair;with chair alarm set;with call bell/phone within reach;with nursing/sitter in room;with family/visitor present Nurse Communication: Mobility status PT Visit Diagnosis: Pain;Muscle weakness (generalized) (M62.81);Unsteadiness on feet (R26.81) Pain - part of body:  (Back)     Time: 8793-8769 PT Time Calculation (min) (ACUTE ONLY): 24 min  Charges:    $Gait Training: 8-22 mins $Therapeutic Activity: 8-22 mins PT General Charges $$ ACUTE PT VISIT: 1 Visit                    Darice Bohr, PTA  Darice JAYSON Bohr 01/17/2024, 1:23 PM

## 2024-01-17 NOTE — Progress Notes (Signed)
 PROGRESS NOTE    Morgan Jacobs  FMW:[id number] DOB: 1933/06/03 DOA: 01/09/2024 PCP: Montey Lot, PA-C   Assessment & Plan:   Principal Problem:   Acute respiratory failure with hypoxia (HCC) Active Problems:   CAP (community acquired pneumonia)   Acute on chronic HFrEF (heart failure with reduced ejection fraction) (HCC)   COPD (chronic obstructive pulmonary disease) (HCC)   Non-traumatic compression fracture of T11 thoracic vertebra, initial encounter (HCC)   Intractable back pain   Ambulatory dysfunction   Obesity, Class III, BMI 40-49.9 (morbid obesity) (HCC)   Paroxysmal atrial fibrillation (HCC)   HTN (hypertension)   Stage 3a chronic kidney disease (CKD) (HCC)   History of osteoporotic pathological fracture   Fatty liver   History of TIA (transient ischemic attack)   Pulmonary hypertension (HCC)   CAD S/P proximal LAD stent (2003)   Acute on chronic congestive heart failure (HCC)   Thoracic compression fracture, closed, initial encounter (HCC)  Assessment and Plan: T11 compression fracture: nontraumatic. Pt declines TLSO brace. PT recs SNF. Waiting on insurance auth still. Surgery deferred. Neuro surg recs apprec    Acute systolic CHF: echo shows EF 50-55%, diastolic function is indeterminate, no regional wall motion abnormalities. Continue on home dose of metoprolol , lasix . Monitor I/Os. Exacerbation resolved  Acute hypoxic respiratory failure: likely secondary to CHF. 88% on RA in ER. Weaned off of supplemental oxygen. Resolved    Hx of CAD: w/ DES to LAD approx in 2003. Continue on home dose of metoprolol , eliquis . D/c home aspirin   Hx of COPD: w/o exacerbation. Bronchodilators prn    A-fib: likely PAF. Continue on home dose of eliquis , metoprolol , diltiazem     HTN: continue on home dose of diltiazem , metoprolol . Holding home dose of losartan   CKDII: baseline Cr is unclear. Cr is labile        DVT prophylaxis: eliquis  Code Status: DNR Family  Communication: called pt's daughter, Morgan Jacobs, but no answer so I left a voicemail  Disposition Plan: waiting on insurance auth still   Level of care: Telemetry Medical Status is: Inpatient Remains inpatient appropriate because: waiting on insurance auth still as per CM      Consultants:  Neuro surg   Procedures:   Antimicrobials:   Subjective: Pt c/o malaise  Objective: Vitals:   01/16/24 1400 01/16/24 1952 01/17/24 0455 01/17/24 0810  BP:  116/64 102/60 (!) 112/59  Pulse: 60 (!) 46 61 63  Resp:  18 19 16   Temp:  98.6 F (37 C) 97.8 F (36.6 C) 98 F (36.7 C)  TempSrc:  Oral Oral   SpO2:  96% 95% 92%  Weight:      Height:        Intake/Output Summary (Last 24 hours) at 01/17/2024 0814 Last data filed at 01/17/2024 0457 Gross per 24 hour  Intake 640 ml  Output 400 ml  Net 240 ml   Filed Weights   01/14/24 0529 01/15/24 0500 01/16/24 0454  Weight: 82.5 kg 79.3 kg 79.6 kg    Examination:  General exam: Appears comfortable  Respiratory system: decreased breath sounds b/l  Cardiovascular system: S1/S2+. No rubs or clicks Gastrointestinal system: Abd is soft, NT, obese & hypoactive bowel sounds  Central nervous system: alert & awake. Moves all extremities Psychiatry: Judgement and insight appears at baseline. Flat mood and affect    Data Reviewed: I have personally reviewed following labs and imaging studies  CBC: Recent Labs  Lab 01/11/24 0534 01/12/24 0418 01/17/24 0414  WBC 11.3*  10.1 8.3  HGB 13.8 14.0 14.3  HCT 42.3 42.1 43.4  MCV 106.0* 104.7* 105.3*  PLT 216 226 196   Basic Metabolic Panel: Recent Labs  Lab 01/11/24 0534 01/12/24 0418 01/13/24 0505 01/14/24 0444 01/17/24 0414  NA 141 140 139 138 139  K 3.8 3.7 3.9 3.8 4.0  CL 97* 97* 100 100 104  CO2 34* 33* 30 27 26   GLUCOSE 121* 99 100* 110* 106*  BUN 34* 35* 26* 23 21  CREATININE 1.14* 1.13* 0.88 0.82 0.91  CALCIUM  8.3* 8.2* 8.3* 8.3* 8.6*   GFR: Estimated Creatinine  Clearance: 39.9 mL/min (by C-G formula based on SCr of 0.91 mg/dL). Liver Function Tests: No results for input(s): AST, ALT, ALKPHOS, BILITOT, PROT, ALBUMIN in the last 168 hours.  No results for input(s): LIPASE, AMYLASE in the last 168 hours.  No results for input(s): AMMONIA in the last 168 hours. Coagulation Profile: No results for input(s): INR, PROTIME in the last 168 hours. Cardiac Enzymes: No results for input(s): CKTOTAL, CKMB, CKMBINDEX, TROPONINI in the last 168 hours. BNP (last 3 results) No results for input(s): PROBNP in the last 8760 hours. HbA1C: No results for input(s): HGBA1C in the last 72 hours. CBG: No results for input(s): GLUCAP in the last 168 hours. Lipid Profile: No results for input(s): CHOL, HDL, LDLCALC, TRIG, CHOLHDL, LDLDIRECT in the last 72 hours. Thyroid  Function Tests: No results for input(s): TSH, T4TOTAL, FREET4, T3FREE, THYROIDAB in the last 72 hours. Anemia Panel: No results for input(s): VITAMINB12, FOLATE, FERRITIN, TIBC, IRON, RETICCTPCT in the last 72 hours. Sepsis Labs: No results for input(s): PROCALCITON, LATICACIDVEN in the last 168 hours.   Recent Results (from the past 240 hours)  Blood culture (single)     Status: None   Collection Time: 01/09/24  8:11 PM   Specimen: BLOOD  Result Value Ref Range Status   Specimen Description BLOOD BLOOD RIGHT ARM  Final   Special Requests   Final    Blood Culture results may not be optimal due to an inadequate volume of blood received in culture bottles BOTTLES DRAWN AEROBIC AND ANAEROBIC   Culture   Final    NO GROWTH 5 DAYS Performed at [facility], [address] [place], [place] [address]    Report Status 01/14/2024 FINAL  Final  Resp panel by RT-PCR (RSV, Flu A&B, Covid) Anterior Nasal Swab     Status: None   Collection Time: 01/09/24 11:17 PM   Specimen: Anterior Nasal Swab  Result Value Ref Range  Status   SARS Coronavirus 2 by RT PCR NEGATIVE NEGATIVE Final    Comment: (NOTE) SARS-CoV-2 target nucleic acids are NOT DETECTED.  The SARS-CoV-2 RNA is generally detectable in upper respiratory specimens during the acute phase of infection. The lowest concentration of SARS-CoV-2 viral copies this assay can detect is 138 copies/mL. A negative result does not preclude SARS-Cov-2 infection and should not be used as the sole basis for treatment or other patient management decisions. A negative result may occur with  improper specimen collection/handling, submission of specimen other than nasopharyngeal swab, presence of viral mutation(s) within the areas targeted by this assay, and inadequate number of viral copies(<138 copies/mL). A negative result must be combined with clinical observations, patient history, and epidemiological information. The expected result is Negative.  Fact Sheet for Patients:  bloggercourse.com  Fact Sheet for Healthcare Providers:  seriousbroker.it  This test is no t yet approved or cleared by the [place]  FDA and  has been authorized for detection and/or diagnosis of SARS-CoV-2 by FDA under an Emergency Use Authorization (EUA). This EUA will remain  in effect (meaning this test can be used) for the duration of the COVID-19 declaration under Section 564(b)(1) of the Act, 21 U.S.C.section 360bbb-3(b)(1), unless the authorization is terminated  or revoked sooner.       Influenza A by PCR NEGATIVE NEGATIVE Final   Influenza B by PCR NEGATIVE NEGATIVE Final    Comment: (NOTE) The Xpert Xpress SARS-CoV-2/FLU/RSV plus assay is intended as an aid in the diagnosis of influenza from Nasopharyngeal swab specimens and should not be used as a sole basis for treatment. Nasal washings and aspirates are unacceptable for Xpert Xpress SARS-CoV-2/FLU/RSV testing.  Fact Sheet for  Patients: bloggercourse.com  Fact Sheet for Healthcare Providers: seriousbroker.it  This test is not yet approved or cleared by the [place]  FDA and has been authorized for detection and/or diagnosis of SARS-CoV-2 by FDA under an Emergency Use Authorization (EUA). This EUA will remain in effect (meaning this test can be used) for the duration of the COVID-19 declaration under Section 564(b)(1) of the Act, 21 U.S.C. section 360bbb-3(b)(1), unless the authorization is terminated or revoked.     Resp Syncytial Virus by PCR NEGATIVE NEGATIVE Final    Comment: (NOTE) Fact Sheet for Patients: bloggercourse.com  Fact Sheet for Healthcare Providers: seriousbroker.it  This test is not yet approved or cleared by the [place]  FDA and has been authorized for detection and/or diagnosis of SARS-CoV-2 by FDA under an Emergency Use Authorization (EUA). This EUA will remain in effect (meaning this test can be used) for the duration of the COVID-19 declaration under Section 564(b)(1) of the Act, 21 U.S.C. section 360bbb-3(b)(1), unless the authorization is terminated or revoked.  Performed at [facility], [address] [place], [place] [address]   Respiratory (~20 pathogens) panel by PCR     Status: None   Collection Time: 01/10/24 11:50 AM   Specimen: Nasopharyngeal Swab; Respiratory  Result Value Ref Range Status   Adenovirus NOT DETECTED NOT DETECTED Final   Coronavirus 229E NOT DETECTED NOT DETECTED Final    Comment: (NOTE) The Coronavirus on the Respiratory Panel, DOES NOT test for the novel  Coronavirus (2019 nCoV)    Coronavirus HKU1 NOT DETECTED NOT DETECTED Final   Coronavirus NL63 NOT DETECTED NOT DETECTED Final   Coronavirus OC43 NOT DETECTED NOT DETECTED Final   Metapneumovirus NOT DETECTED NOT DETECTED Final   Rhinovirus / Enterovirus NOT DETECTED NOT  DETECTED Final   Influenza A NOT DETECTED NOT DETECTED Final   Influenza B NOT DETECTED NOT DETECTED Final   Parainfluenza Virus 1 NOT DETECTED NOT DETECTED Final   Parainfluenza Virus 2 NOT DETECTED NOT DETECTED Final   Parainfluenza Virus 3 NOT DETECTED NOT DETECTED Final   Parainfluenza Virus 4 NOT DETECTED NOT DETECTED Final   Respiratory Syncytial Virus NOT DETECTED NOT DETECTED Final   Bordetella pertussis NOT DETECTED NOT DETECTED Final   Bordetella Parapertussis NOT DETECTED NOT DETECTED Final   Chlamydophila pneumoniae NOT DETECTED NOT DETECTED Final   Mycoplasma pneumoniae NOT DETECTED NOT DETECTED Final    Comment: Performed at [facility], [address] [place], [place] [address]         Radiology Studies: No results found.      Scheduled Meds:  apixaban   5 mg Oral BID   diltiazem   180 mg Oral Daily   furosemide   20 mg Oral Daily  guaiFENesin  600 mg Oral BID   metoprolol  succinate  50 mg Oral Daily   polyethylene glycol  34 g Oral Daily   Continuous Infusions:   LOS: 7 days    Anthony CHRISTELLA Pouch, MD [facility] Pager [phone]  If 7PM-7AM, please contact night-coverage [email or web address] 01/17/2024, 8:14 AM

## 2024-01-17 NOTE — Plan of Care (Incomplete)
  Problem: Education: Goal: Knowledge of General Education information will improve Description: Including pain rating scale, medication(s)/side effects and non-pharmacologic comfort measures Outcome: Progressing   Problem: Clinical Measurements: Goal: Ability to maintain clinical measurements within normal limits will improve Outcome: Progressing   Problem: Coping: Goal: Level of anxiety will decrease Outcome: Progressing   Problem: Elimination: Goal: Will not experience complications related to urinary retention Outcome: Progressing

## 2024-01-18 DIAGNOSIS — S22080A Wedge compression fracture of T11-T12 vertebra, initial encounter for closed fracture: Secondary | ICD-10-CM | POA: Diagnosis not present

## 2024-01-18 LAB — CBC
HCT: 42.9 % (ref 36.0–46.0)
Hemoglobin: 14.1 g/dL (ref 12.0–15.0)
MCH: 34.7 pg — ABNORMAL HIGH (ref 26.0–34.0)
MCHC: 32.9 g/dL (ref 30.0–36.0)
MCV: 105.7 fL — ABNORMAL HIGH (ref 80.0–100.0)
Platelets: 183 K/uL (ref 150–400)
RBC: 4.06 MIL/uL (ref 3.87–5.11)
RDW: 11.6 % (ref 11.5–15.5)
WBC: 8 K/uL (ref 4.0–10.5)
nRBC: 0 % (ref 0.0–0.2)

## 2024-01-18 LAB — BASIC METABOLIC PANEL WITH GFR
Anion gap: 8 (ref 5–15)
BUN: 21 mg/dL (ref 8–23)
CO2: 26 mmol/L (ref 22–32)
Calcium: 8.6 mg/dL — ABNORMAL LOW (ref 8.9–10.3)
Chloride: 106 mmol/L (ref 98–111)
Creatinine, Ser: 0.99 mg/dL (ref 0.44–1.00)
GFR, Estimated: 55 mL/min — ABNORMAL LOW (ref >60)
Glucose, Bld: 99 mg/dL (ref 70–99)
Potassium: 4 mmol/L (ref 3.5–5.1)
Sodium: 140 mmol/L (ref 135–145)

## 2024-01-18 MED ORDER — OXYCODONE HCL 5 MG PO TABS: 5.0000 mg | ORAL_TABLET | Freq: Four times a day (QID) | ORAL | 0 refills | Status: AC | PRN

## 2024-01-18 NOTE — Progress Notes (Signed)
 Physical Therapy Treatment Patient Details Name: Morgan Jacobs MRN: [id number] DOB: [birth year] Today's Date: 01/18/2024   History of Present Illness Pt is an 89 y/o F admitted on 01/09/24 after presenting with c/o intractable back pain 2/2 acute T1 compression fx. Also noted to have R lower lobe PNA with new O2 requirement. 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 Discussed and reviewed spinal precautions, log roll, benefits of TLSO, which pt agreed to Abdominal binder which provided good support during gait training and back pain relief. Pt continues to progress with transfers and dynamic balance. Anticipate she will do well in STR.   If plan is discharge home, recommend the following: A little help with walking and/or transfers;A little help with bathing/dressing/bathroom;Assistance with cooking/housework;Assist for transportation;Help with stairs or ramp for entrance   Can travel by private vehicle     Yes  Equipment Recommendations  None recommended by PT    Recommendations for Other Services       Precautions / Restrictions Precautions Precautions: Fall;Back Precaution Booklet Issued: Yes (comment) (Pt given spinal precautions hand out with good understanding) Recall of Precautions/Restrictions: Intact Precaution/Restrictions Comments: per neurosurgery note:she may feel better with the TLSO brace as this could be supportive across the junction of her fracture pt currently declining and was given an abdominal binder today which she tolerates well Required Braces or Orthoses: Other Brace (Abd Binder in place of TLSO) Restrictions Weight Bearing Restrictions Per Provider Order: No     Mobility  Bed Mobility                General bed mobility comments: remaine up in chair after session    Transfers Overall transfer level: Needs assistance Equipment used: Rolling walker (2 wheels) Transfers: Sit to/from Stand Sit to Stand: Contact guard assist           General transfer comment:  (Improved ability to stand from recliner)    Ambulation/Gait Ambulation/Gait assistance: Contact guard assist Gait Distance (Feet):  (85) Assistive device: Rolling walker (2 wheels) Gait Pattern/deviations: Decreased step length - right, Decreased step length - left, Decreased stride length Gait velocity: decreased     General Gait Details:  (Abdominal binder applied in standing prior to gait training. Good relief of back discomfort while ambulating)   Stairs             Wheelchair Mobility     Tilt Bed    Modified Rankin (Stroke Patients Only)       Balance Overall balance assessment: Needs assistance Sitting-balance support: Feet supported Sitting balance-Leahy Scale: Good     Standing balance support: Bilateral upper extremity supported, During functional activity, Reliant on assistive device for balance Standing balance-Leahy Scale: Fair                              Hotel Manager: No apparent difficulties  Cognition Arousal: Alert Behavior During Therapy: WFL for tasks assessed/performed   PT - Cognitive impairments: Safety/Judgement, Awareness                       PT - Cognition Comments:  (Cues to maintain spinal precautions) Following commands: Intact  Cueing Cueing Techniques: Verbal cues  Exercises      General Comments General comments (skin integrity, edema, etc.):  (Pt and daughter educated on MD's recs for TLSO if tolerated, f/u with Neurology for potential Kyphoplasty and safe transition to STR)      Pertinent Vitals/Pain Pain Assessment Pain Assessment: Faces Faces Pain Scale: Hurts little more Pain  Location: back with mobility Pain Descriptors / Indicators: Discomfort, Grimacing, Aching Pain Intervention(s): Patient requesting pain meds-RN notified    Home Living                          Prior Function            PT Goals (current goals can now be found in the care plan section) Acute Rehab PT Goals Patient Stated Goal: decreased pain, go home Progress towards PT goals: Progressing toward goals    Frequency    Min 2X/week      PT Plan      Co-evaluation              AM-PAC PT 6 Clicks Mobility   Outcome Measure  Help needed turning from your back to your side while in a flat bed without using bedrails?: A Little Help needed moving from lying on your back to sitting on the side of a flat bed without using bedrails?: A Little Help needed moving to and from a bed to a chair (including a wheelchair)?: A Little Help needed standing up from a chair using your arms (e.g., wheelchair or bedside chair)?: A Little Help needed to walk in hospital room?: A Little Help needed climbing 3-5 steps with a railing? : A Lot 6 Click Score: 17    End of Session Equipment Utilized During Treatment: Gait belt Activity Tolerance: Patient limited by pain Patient left: in chair;with chair alarm set;with call bell/phone within reach;with nursing/sitter in room;with family/visitor present Nurse Communication: Mobility status PT Visit Diagnosis: Pain;Muscle weakness (generalized) (M62.81);Unsteadiness on feet (R26.81) Pain - part of body:  (back)     Time: 8982-8956 PT Time Calculation (min) (ACUTE ONLY): 26 min  Charges:    $Gait Training: 8-22 mins $Therapeutic Activity: 8-22 mins PT General Charges $$ ACUTE PT VISIT: 1 Visit                    Darice Bohr, PTA  Darice JAYSON Bohr 01/18/2024, 1:16 PM

## 2024-01-18 NOTE — Discharge Summary (Signed)
 Physician Discharge Summary  Morgan Jacobs FMW:[id number] DOB: January 29, 1934 DOA: 01/09/2024  PCP: Montey Lot, PA-C  Admit date: 01/09/2024 Discharge date: 01/18/2024  Admitted From: home Disposition:  SNF  Recommendations for Outpatient Follow-up:  Follow up with PCP in 1-2 weeks F/u w/ neuro surg, Dr. Sharps, in 4-6 weeks  Home Health: no Equipment/Devices:  Discharge Condition: stable  CODE STATUS:DNR Diet recommendation: Heart Healthy  Brief/Interim Summary: HPI was taken from Dr. Cleatus: Morgan Jacobs is a 89 y.o. female with medical history significant for HFrEF (EF 45 to 50%, 12/2022), pulmonary hypertension, A-fib on Eliquis , HTN, COPD, CAD s/p proximal LAD stent (2003), NASH,  TIA(07/2021), parotid mass noted in 12/2022, chronic back pain and hip pain with history of osteoporotic vertebral fracture being admitted with intractable back pain secondary to an acute T1 compression fracture, as well as a right lower lobe pneumonia seen on CT with new O2 requirement of 2 L. Patient was initially brought in by EMS with a several day history of worsening of her chronic right-sided low back pain causing her to stay in bed which is not normal for her.  Her decreased mobility was initially attributed to her burying her son recently.  Additionally, patient has had a cough for the past month, seeing her PCP on 2 occasions for which she was started on prednisone without improvement.  She has had no chest pain, lower extremity pain or swelling. In the ED afebrile, tachypneic to 22, normal pulse and blood pressure, O2 sat 88 on room air requiring 2 L to get to the mid to high 90s. Labs notable for WBC 12.5 with lactic acid 1.2.  BNP 954 Total bilirubin 1.5 UA unremarkable Respiratory viral panel not done EKG not done CT lumbar and thoracic spine showing acute compression fracture superior endplate of T11 without retropulsion and less than 10% vertebral body height loss with abnormal chronic  findings CT T-spine also showed airspace opacity right lower lobe possibly asymmetric edema versus pneumonia as well as interstitial edema in the lungs with small bilateral pleural effusions Patient treated with a dose of IV Lasix  Started on Rocephin and azithromycin for community-acquired pneumonia The ED provider: Spoke with neurosurgeon, Dr. Penne Sharps who recommended no TLSO brace due to possible pneumonia, acute intervention but will see in the morning. Admission requested    Discharge Diagnoses:  Principal Problem:   Acute respiratory failure with hypoxia (HCC) Active Problems:   CAP (community acquired pneumonia)   Acute on chronic HFrEF (heart failure with reduced ejection fraction) (HCC)   COPD (chronic obstructive pulmonary disease) (HCC)   Non-traumatic compression fracture of T11 thoracic vertebra, initial encounter (HCC)   Intractable back pain   Ambulatory dysfunction   Obesity, Class III, BMI 40-49.9 (morbid obesity) (HCC)   Paroxysmal atrial fibrillation (HCC)   HTN (hypertension)   Stage 3a chronic kidney disease (CKD) (HCC)   History of osteoporotic pathological fracture   Fatty liver   History of TIA (transient ischemic attack)   Pulmonary hypertension (HCC)   CAD S/P proximal LAD stent (2003)   Acute on chronic congestive heart failure (HCC)   Thoracic compression fracture, closed, initial encounter (HCC)  T11 compression fracture: nontraumatic. Pt declines TLSO brace. PT recs SNF.  Surgery deferred. Neuro surg recs apprec    Acute systolic CHF: echo shows EF 50-55%, diastolic function is indeterminate, no regional wall motion abnormalities. Continue on home dose of metoprolol , lasix , losartan. Monitor I/Os. Exacerbation resolved  Acute hypoxic respiratory failure: likely secondary to CHF.  88% on RA in ER. Weaned off of supplemental oxygen. Resolved    Hx of CAD: w/ DES to LAD approx in 2003. Continue on home dose of metoprolol , eliquis . D/c home dose of  aspirin as there is a higher risk of bleeding w/ eliquis     Hx of COPD: w/o exacerbation. Bronchodilators prn    A-fib: likely PAF. Continue on home dose of eliquis , metoprolol , diltiazem .    HTN: continue on home dose of diltiazem , metoprolol , losartan   CKDII: baseline Cr is unclear. Cr is labile   Discharge Instructions  Discharge Instructions     Diet - low sodium heart healthy   Complete by: As directed    Discharge instructions   Complete by: As directed    F/u w/ PCP in 1-2 weeks. F/u w/ neuro surg, Dr. Claudene, in 4-6 weeks   Increase activity slowly   Complete by: As directed       Allergies as of 01/18/2024   No Known Allergies      Medication List     STOP taking these medications    aspirin 81 MG chewable tablet       TAKE these medications    albuterol 108 (90 Base) MCG/ACT inhaler Commonly known as: VENTOLIN HFA Inhale 1-2 puffs into the lungs every 6 (six) hours as needed.   diltiazem  180 MG 24 hr capsule Commonly known as: DILACOR XR  Take 180 mg by mouth daily.   Eliquis  5 MG Tabs tablet Generic drug: apixaban  Take 5 mg by mouth 2 (two) times daily.   ezetimibe  10 MG tablet Commonly known as: ZETIA  Take 1 tablet (10 mg total) by mouth daily.   furosemide  20 MG tablet Commonly known as: LASIX  Take 20 mg by mouth daily.   loratadine 10 MG tablet Commonly known as: CLARITIN Take 10 mg by mouth 2 (two) times daily.   losartan 50 MG tablet Commonly known as: COZAAR Take 50 mg by mouth daily.   metoprolol  succinate 50 MG 24 hr tablet Commonly known as: TOPROL -XL Take 50 mg by mouth daily.   omeprazole 20 MG capsule Commonly known as: PRILOSEC Take 20 mg by mouth daily as needed (reflux).   oxyCODONE 5 MG immediate release tablet Commonly known as: Oxy IR/ROXICODONE Take 1 tablet (5 mg total) by mouth every 6 (six) hours as needed for up to 2 days for moderate pain (pain score 4-6) or severe pain (pain score 7-10).   predniSONE  20 MG tablet Commonly known as: DELTASONE Take 20 mg by mouth daily with breakfast.        Follow-up Information     [facility]. Go on 01/21/2024.   Specialty: Cardiology Why: Hospital Follow-Up 01/21/24 @ 3:30 Please bring all medications to follow-up appointment [facility], [address] Floor Free Valet Parking at the door Contact information: [address] [place]  [address] [phone]               No Known Allergies  Consultations: Neuro surg    Procedures/Studies: ECHOCARDIOGRAM COMPLETE Result Date: 01/10/2024    ECHOCARDIOGRAM REPORT   Patient Name:   Morgan Jacobs Date of Exam: 01/10/2024 Medical Rec #:  [id number]   Height:       60.0 in Accession #:    [id number]  Weight:       190.0 lb Date of Birth:  05-12-1933   BSA:  1.826 m Patient Age:    89 years    BP:           106/66 mmHg Patient Gender: F           HR:           97 bpm. Exam Location:  [facility] Procedure: 2D Echo, Cardiac Doppler and Color Doppler (Both Spectral and Color            Flow Doppler were utilized during procedure). Indications:     CHF  History:         Patient has prior history of Echocardiogram examinations, most                  recent 08/07/2021. CAD, COPD; Risk Factors:Hypertension. CKD.  Sonographer:     Philomena Daring Referring Phys:  [id number] DELAYNE LULLA SOLIAN Diagnosing Phys: Evalene Lunger MD IMPRESSIONS  1. Left ventricular ejection fraction, by estimation, is 50 to 55%. The left ventricle has low normal function. The left ventricle has no regional wall motion abnormalities. There is mild left ventricular hypertrophy. Left ventricular diastolic parameters are indeterminate.  2. Right ventricular systolic function is mildly reduced. The right ventricular size is normal. There is mildly elevated pulmonary artery systolic pressure. The estimated right ventricular systolic pressure is 38.6 mmHg.  3. Left  atrial size was mildly dilated.  4. The mitral valve is normal in structure. Mild mitral valve regurgitation. No evidence of mitral stenosis. Moderate mitral annular calcification.  5. Tricuspid valve regurgitation is moderate.  6. The aortic valve is normal in structure. Aortic valve regurgitation is mild. Aortic valve sclerosis is present, with no evidence of aortic valve stenosis.  7. The inferior vena cava is normal in size with greater than 50% respiratory variability, suggesting right atrial pressure of 3 mmHg.  8. Rhythm concerning for atrial flibrillation. FINDINGS  Left Ventricle: Left ventricular ejection fraction, by estimation, is 50 to 55%. The left ventricle has low normal function. The left ventricle has no regional wall motion abnormalities. Strain was performed and the global longitudinal strain is indeterminate. The left ventricular internal cavity size was normal in size. There is mild left ventricular hypertrophy. Left ventricular diastolic parameters are indeterminate. Right Ventricle: The right ventricular size is normal. No increase in right ventricular wall thickness. Right ventricular systolic function is mildly reduced. There is mildly elevated pulmonary artery systolic pressure. The tricuspid regurgitant velocity  is 2.90 m/s, and with an assumed right atrial pressure of 5 mmHg, the estimated right ventricular systolic pressure is 38.6 mmHg. Left Atrium: Left atrial size was mildly dilated. Right Atrium: Right atrial size was normal in size. Pericardium: There is no evidence of pericardial effusion. Mitral Valve: The mitral valve is normal in structure. Moderate mitral annular calcification. Mild mitral valve regurgitation. No evidence of mitral valve stenosis. Tricuspid Valve: The tricuspid valve is normal in structure. Tricuspid valve regurgitation is moderate . No evidence of tricuspid stenosis. Aortic Valve: The aortic valve is normal in structure. Aortic valve regurgitation is mild.  Aortic regurgitation PHT measures 1022 msec. Aortic valve sclerosis is present, with no evidence of aortic valve stenosis. Pulmonic Valve: The pulmonic valve was normal in structure. Pulmonic valve regurgitation is not visualized. No evidence of pulmonic stenosis. Aorta: The aortic root is normal in size and structure. Venous: The inferior vena cava is normal in size with greater than 50% respiratory variability, suggesting right atrial pressure of 3 mmHg. IAS/Shunts: No atrial level shunt detected by color flow  Doppler. Additional Comments: 3D was performed not requiring image post processing on an independent workstation and was indeterminate.  LEFT VENTRICLE PLAX 2D LVIDd:         4.10 cm   Diastology LVIDs:         2.70 cm   LV e' medial:    6.42 cm/s LV PW:         1.10 cm   LV E/e' medial:  15.5 LV IVS:        1.20 cm   LV e' lateral:   9.36 cm/s LVOT diam:     1.70 cm   LV E/e' lateral: 10.6 LV SV:         30 LV SV Index:   17 LVOT Area:     2.27 cm  RIGHT VENTRICLE            IVC RV S prime:     6.42 cm/s  IVC diam: 2.30 cm TAPSE (M-mode): 1.3 cm LEFT ATRIUM             Index        RIGHT ATRIUM           Index LA diam:        4.20 cm 2.30 cm/m   RA Area:     19.90 cm LA Vol (A2C):   51.0 ml 27.92 ml/m  RA Volume:   52.40 ml  28.69 ml/m LA Vol (A4C):   68.3 ml 37.40 ml/m LA Biplane Vol: 57.8 ml 31.65 ml/m  AORTIC VALVE LVOT Vmax:   71.90 cm/s LVOT Vmean:  51.100 cm/s LVOT VTI:    0.134 m AI PHT:      1022 msec  AORTA Ao Root diam: 2.60 cm MITRAL VALVE               TRICUSPID VALVE MV Area (PHT): 7.90 cm    TR Peak grad:   33.6 mmHg MV Decel Time: 96 msec     TR Vmax:        290.00 cm/s MV E velocity: 99.60 cm/s                            SHUNTS                            Systemic VTI:  0.13 m                            Systemic Diam: 1.70 cm Evalene Lunger MD Electronically signed by Evalene Lunger MD Signature Date/Time: 01/10/2024/1:07:53 PM    Final    CT Thoracic Spine Wo Contrast Result Date:  01/09/2024 EXAM: CT THORACIC AND LUMBAR SPINE WITHOUT INTRAVENOUS CONTRAST 01/09/2024 06:10:18 PM TECHNIQUE: CT of the thoracic and lumbar spine was performed without the administration of intravenous contrast. Multiplanar reformatted images are provided for review. Automated exposure control, iterative reconstruction, and/or weight based adjustment of the mA/kV was utilized to reduce the radiation dose to as low as reasonably achievable. Incidental adrenal and/or renal findings do not require follow up imaging. COMPARISON: Lumbar spine radiographs 10/11/2022. CLINICAL HISTORY: Mid-back pain, prior compression fracture. Chronic low right sided back pain. Hx DJD, arthritis. C/O last couple of days increasing pain with standing. Recently started prednisone for back pain, not effective. FINDINGS: THORACIC SPINE: BONES AND ALIGNMENT: Suspected compression fracture of the superior endplate of  T11 with an acute appearance. Less than 10% T11 vertebral body height loss. No retropulsion. Normal alignment. DEGENERATIVE CHANGES: Mild spondylosis. No severe thoracic spinal canal narrowing. SOFT TISSUES: Interlobular septal thickening in the lungs. Hazy airspace opacity in the right lower lobe. Small right greater than left pleural effusions. LUMBAR SPINE: BONES AND ALIGNMENT: Anterior compression fracture of L4 has increased from 10/11/2022. Additional compression fracture of L5 is new since 2024. 60% vertebral body height loss of L4 anteriorly. 25% vertebral body height loss of L5. 4 mm of retropulsion of the superior endplate of L4 and 4 mm of retropulsion of the superior endplate of L5. These are age indeterminate without recent comparison but favored chronic. Grade 1 retrolisthesis of L2. This is unchanged from prior. DEGENERATIVE CHANGES: Multilevel spondylosis, disc space height loss, vacuum phenomenon, and degenerative endplate changes. These are greatest at L2-L3 where they are moderate. Moderate to advanced facet  arthropathy in the lower lumbar spine. Spinal canal narrowing is greatest at L3-L4 where it is severe secondary to posterior disc bulge, retropulsion of the superior endplate of L4 (4 mm), ligamentum flavum hypertrophy, and facet arthropathy. The disc bulge at L3-L4 causes severe narrowing of the bilateral neural foramina. SOFT TISSUES: No acute abnormality. IMPRESSION: 1. Suspected acute compression fracture of the superior endplate of T11 with less than 10% vertebral body height loss. No retropulsion. 2. Presumed chronic compression fractures of L4 and L5 as described. 3. Severe spinal canal and bilateral neural foraminal narrowing at L3 - L4. 4. Hazy airspace opacity in the right lower lobe may be asymmetric edema or pneumonia. 5. Interstitial edema in the lungs with small bilateral pleural effusions. Electronically signed by: Norman Gatlin MD 01/09/2024 06:30 PM EDT RP Workstation: [id number]   CT Lumbar Spine Wo Contrast Result Date: 01/09/2024 EXAM: CT THORACIC AND LUMBAR SPINE WITHOUT INTRAVENOUS CONTRAST 01/09/2024 06:10:18 PM TECHNIQUE: CT of the thoracic and lumbar spine was performed without the administration of intravenous contrast. Multiplanar reformatted images are provided for review. Automated exposure control, iterative reconstruction, and/or weight based adjustment of the mA/kV was utilized to reduce the radiation dose to as low as reasonably achievable. Incidental adrenal and/or renal findings do not require follow up imaging. COMPARISON: Lumbar spine radiographs 10/11/2022. CLINICAL HISTORY: Mid-back pain, prior compression fracture. Chronic low right sided back pain. Hx DJD, arthritis. C/O last couple of days increasing pain with standing. Recently started prednisone for back pain, not effective. FINDINGS: THORACIC SPINE: BONES AND ALIGNMENT: Suspected compression fracture of the superior endplate of T11 with an acute appearance. Less than 10% T11 vertebral body height loss. No  retropulsion. Normal alignment. DEGENERATIVE CHANGES: Mild spondylosis. No severe thoracic spinal canal narrowing. SOFT TISSUES: Interlobular septal thickening in the lungs. Hazy airspace opacity in the right lower lobe. Small right greater than left pleural effusions. LUMBAR SPINE: BONES AND ALIGNMENT: Anterior compression fracture of L4 has increased from 10/11/2022. Additional compression fracture of L5 is new since 2024. 60% vertebral body height loss of L4 anteriorly. 25% vertebral body height loss of L5. 4 mm of retropulsion of the superior endplate of L4 and 4 mm of retropulsion of the superior endplate of L5. These are age indeterminate without recent comparison but favored chronic. Grade 1 retrolisthesis of L2. This is unchanged from prior. DEGENERATIVE CHANGES: Multilevel spondylosis, disc space height loss, vacuum phenomenon, and degenerative endplate changes. These are greatest at L2-L3 where they are moderate. Moderate to advanced facet arthropathy in the lower lumbar spine. Spinal canal narrowing is greatest at L3-L4  where it is severe secondary to posterior disc bulge, retropulsion of the superior endplate of L4 (4 mm), ligamentum flavum hypertrophy, and facet arthropathy. The disc bulge at L3-L4 causes severe narrowing of the bilateral neural foramina. SOFT TISSUES: No acute abnormality. IMPRESSION: 1. Suspected acute compression fracture of the superior endplate of T11 with less than 10% vertebral body height loss. No retropulsion. 2. Presumed chronic compression fractures of L4 and L5 as described. 3. Severe spinal canal and bilateral neural foraminal narrowing at L3 - L4. 4. Hazy airspace opacity in the right lower lobe may be asymmetric edema or pneumonia. 5. Interstitial edema in the lungs with small bilateral pleural effusions. Electronically signed by: Norman Gatlin MD 01/09/2024 06:30 PM EDT RP Workstation: [id number]   DG Chest 1 View Result Date: 01/09/2024 EXAM: 1 VIEW(S) XRAY OF THE  CHEST 01/09/2024 04:16:00 PM COMPARISON: None available. CLINICAL HISTORY: Cough FINDINGS: LUNGS AND PLEURA: There is soft tissue attenuation artifact overlying the lower lung zones. Pulmonary vascular congestion. Small left pleural effusion suspected. Decreased aeration to the left lower lobe may reflect underlying atelectasis or airspace disease. Repeat imaging as the patient's clinical condition tolerates in the upright PA and lateral orientation advised. No pneumothorax. HEART AND MEDIASTINUM: Atherosclerotic plaque. Unchanged cardiac enlargement. BONES AND SOFT TISSUES: No acute osseous abnormality. IMPRESSION: 1. Diminished exam detail secondary to suboptimal patient positioning with soft tissue attenuation artifact overlying both lower lung zones. 2. Decreased aeration to the left lower lobe, possibly representing atelectasis or airspace disease. 3. Small left pleural effusion suspected. 4. Pulmonary vascular congestion. Electronically signed by: Waddell Calk MD 01/09/2024 04:29 PM EDT RP Workstation: [id number]   (Echo, Carotid, EGD, Colonoscopy, ERCP)    Subjective: Pt c/o fatigue    Discharge Exam: Vitals:   01/18/24 0428 01/18/24 0741  BP: 108/64 108/64  Pulse: 74 68  Resp: 18 16  Temp: 98.4 F (36.9 C) 98.1 F (36.7 C)  SpO2: 94% 93%   Vitals:   01/17/24 1931 01/18/24 0428 01/18/24 0434 01/18/24 0741  BP: (!) 110/59 108/64  108/64  Pulse: 85 74  68  Resp: 18 18  16   Temp: 97.6 F (36.4 C) 98.4 F (36.9 C)  98.1 F (36.7 C)  TempSrc:    Oral  SpO2: 95% 94%  93%  Weight:   79.4 kg   Height:        General: Pt is alert, awake, not in acute distress Cardiovascular:  S1/S2 +, no rubs, no gallops Respiratory: CTA bilaterally, no wheezing, no rhonchi Abdominal: Soft, NT,obese, bowel sounds + Extremities:  no cyanosis    The results of significant diagnostics from this hospitalization (including imaging, microbiology, ancillary and laboratory) are listed below for  reference.     Microbiology: Recent Results (from the past 240 hours)  Blood culture (single)     Status: None   Collection Time: 01/09/24  8:11 PM   Specimen: BLOOD  Result Value Ref Range Status   Specimen Description BLOOD BLOOD RIGHT ARM  Final   Special Requests   Final    Blood Culture results may not be optimal due to an inadequate volume of blood received in culture bottles BOTTLES DRAWN AEROBIC AND ANAEROBIC   Culture   Final    NO GROWTH 5 DAYS Performed at [facility], [address] [place], [place] [address]    Report Status 01/14/2024 FINAL  Final  Resp panel by RT-PCR (RSV, Flu A&B, Covid) Anterior Nasal Swab     Status: None  Collection Time: 01/09/24 11:17 PM   Specimen: Anterior Nasal Swab  Result Value Ref Range Status   SARS Coronavirus 2 by RT PCR NEGATIVE NEGATIVE Final    Comment: (NOTE) SARS-CoV-2 target nucleic acids are NOT DETECTED.  The SARS-CoV-2 RNA is generally detectable in upper respiratory specimens during the acute phase of infection. The lowest concentration of SARS-CoV-2 viral copies this assay can detect is 138 copies/mL. A negative result does not preclude SARS-Cov-2 infection and should not be used as the sole basis for treatment or other patient management decisions. A negative result may occur with  improper specimen collection/handling, submission of specimen other than nasopharyngeal swab, presence of viral mutation(s) within the areas targeted by this assay, and inadequate number of viral copies(<138 copies/mL). A negative result must be combined with clinical observations, patient history, and epidemiological information. The expected result is Negative.  Fact Sheet for Patients:  bloggercourse.com  Fact Sheet for Healthcare Providers:  seriousbroker.it  This test is no t yet approved or cleared by the [place]  FDA and  has been authorized for detection and/or  diagnosis of SARS-CoV-2 by FDA under an Emergency Use Authorization (EUA). This EUA will remain  in effect (meaning this test can be used) for the duration of the COVID-19 declaration under Section 564(b)(1) of the Act, 21 U.S.C.section 360bbb-3(b)(1), unless the authorization is terminated  or revoked sooner.       Influenza A by PCR NEGATIVE NEGATIVE Final   Influenza B by PCR NEGATIVE NEGATIVE Final    Comment: (NOTE) The Xpert Xpress SARS-CoV-2/FLU/RSV plus assay is intended as an aid in the diagnosis of influenza from Nasopharyngeal swab specimens and should not be used as a sole basis for treatment. Nasal washings and aspirates are unacceptable for Xpert Xpress SARS-CoV-2/FLU/RSV testing.  Fact Sheet for Patients: bloggercourse.com  Fact Sheet for Healthcare Providers: seriousbroker.it  This test is not yet approved or cleared by the [place]  FDA and has been authorized for detection and/or diagnosis of SARS-CoV-2 by FDA under an Emergency Use Authorization (EUA). This EUA will remain in effect (meaning this test can be used) for the duration of the COVID-19 declaration under Section 564(b)(1) of the Act, 21 U.S.C. section 360bbb-3(b)(1), unless the authorization is terminated or revoked.     Resp Syncytial Virus by PCR NEGATIVE NEGATIVE Final    Comment: (NOTE) Fact Sheet for Patients: bloggercourse.com  Fact Sheet for Healthcare Providers: seriousbroker.it  This test is not yet approved or cleared by the [place]  FDA and has been authorized for detection and/or diagnosis of SARS-CoV-2 by FDA under an Emergency Use Authorization (EUA). This EUA will remain in effect (meaning this test can be used) for the duration of the COVID-19 declaration under Section 564(b)(1) of the Act, 21 U.S.C. section 360bbb-3(b)(1), unless the authorization is terminated  or revoked.  Performed at [facility], [address] [place], [place] [address]   Respiratory (~20 pathogens) panel by PCR     Status: None   Collection Time: 01/10/24 11:50 AM   Specimen: Nasopharyngeal Swab; Respiratory  Result Value Ref Range Status   Adenovirus NOT DETECTED NOT DETECTED Final   Coronavirus 229E NOT DETECTED NOT DETECTED Final    Comment: (NOTE) The Coronavirus on the Respiratory Panel, DOES NOT test for the novel  Coronavirus (2019 nCoV)    Coronavirus HKU1 NOT DETECTED NOT DETECTED Final   Coronavirus NL63 NOT DETECTED NOT DETECTED Final   Coronavirus OC43 NOT DETECTED NOT DETECTED Final   Metapneumovirus  NOT DETECTED NOT DETECTED Final   Rhinovirus / Enterovirus NOT DETECTED NOT DETECTED Final   Influenza A NOT DETECTED NOT DETECTED Final   Influenza B NOT DETECTED NOT DETECTED Final   Parainfluenza Virus 1 NOT DETECTED NOT DETECTED Final   Parainfluenza Virus 2 NOT DETECTED NOT DETECTED Final   Parainfluenza Virus 3 NOT DETECTED NOT DETECTED Final   Parainfluenza Virus 4 NOT DETECTED NOT DETECTED Final   Respiratory Syncytial Virus NOT DETECTED NOT DETECTED Final   Bordetella pertussis NOT DETECTED NOT DETECTED Final   Bordetella Parapertussis NOT DETECTED NOT DETECTED Final   Chlamydophila pneumoniae NOT DETECTED NOT DETECTED Final   Mycoplasma pneumoniae NOT DETECTED NOT DETECTED Final    Comment: Performed at [facility], [address], [place], [place] [address]     Labs: BNP (last 3 results) Recent Labs    01/09/24 1527  BNP 954.9*   Basic Metabolic Panel: Recent Labs  Lab 01/12/24 0418 01/13/24 0505 01/14/24 0444 01/17/24 0414 01/18/24 0359  NA 140 139 138 139 140  K 3.7 3.9 3.8 4.0 4.0  CL 97* 100 100 104 106  CO2 33* 30 27 26 26   GLUCOSE 99 100* 110* 106* 99  BUN 35* 26* 23 21 21   CREATININE 1.13* 0.88 0.82 0.91 0.99  CALCIUM  8.2* 8.3* 8.3* 8.6* 8.6*   Liver Function Tests: No results for input(s): AST,  ALT, ALKPHOS, BILITOT, PROT, ALBUMIN in the last 168 hours. No results for input(s): LIPASE, AMYLASE in the last 168 hours. No results for input(s): AMMONIA in the last 168 hours. CBC: Recent Labs  Lab 01/12/24 0418 01/17/24 0414 01/18/24 0359  WBC 10.1 8.3 8.0  HGB 14.0 14.3 14.1  HCT 42.1 43.4 42.9  MCV 104.7* 105.3* 105.7*  PLT 226 196 183   Cardiac Enzymes: No results for input(s): CKTOTAL, CKMB, CKMBINDEX, TROPONINI in the last 168 hours. BNP: Invalid input(s): POCBNP CBG: No results for input(s): GLUCAP in the last 168 hours. D-Dimer No results for input(s): DDIMER in the last 72 hours. Hgb A1c No results for input(s): HGBA1C in the last 72 hours. Lipid Profile No results for input(s): CHOL, HDL, LDLCALC, TRIG, CHOLHDL, LDLDIRECT in the last 72 hours. Thyroid  function studies No results for input(s): TSH, T4TOTAL, T3FREE, THYROIDAB in the last 72 hours.  Invalid input(s): FREET3 Anemia work up No results for input(s): VITAMINB12, FOLATE, FERRITIN, TIBC, IRON, RETICCTPCT in the last 72 hours. Urinalysis    Component Value Date/Time   COLORURINE YELLOW (A) 01/09/2024 1630   APPEARANCEUR CLEAR (A) 01/09/2024 1630   LABSPEC 1.013 01/09/2024 1630   PHURINE 6.0 01/09/2024 1630   GLUCOSEU NEGATIVE 01/09/2024 1630   HGBUR NEGATIVE 01/09/2024 1630   BILIRUBINUR NEGATIVE 01/09/2024 1630   KETONESUR NEGATIVE 01/09/2024 1630   PROTEINUR NEGATIVE 01/09/2024 1630   NITRITE NEGATIVE 01/09/2024 1630   LEUKOCYTESUR NEGATIVE 01/09/2024 1630   Sepsis Labs Recent Labs  Lab 01/12/24 0418 01/17/24 0414 01/18/24 0359  WBC 10.1 8.3 8.0   Microbiology Recent Results (from the past 240 hours)  Blood culture (single)     Status: None   Collection Time: 01/09/24  8:11 PM   Specimen: BLOOD  Result Value Ref Range Status   Specimen Description BLOOD BLOOD RIGHT ARM  Final   Special Requests   Final    Blood  Culture results may not be optimal due to an inadequate volume of blood received in culture bottles BOTTLES DRAWN AEROBIC AND ANAEROBIC   Culture   Final  NO GROWTH 5 DAYS Performed at [facility], [address] [place], [place] [address]    Report Status 01/14/2024 FINAL  Final  Resp panel by RT-PCR (RSV, Flu A&B, Covid) Anterior Nasal Swab     Status: None   Collection Time: 01/09/24 11:17 PM   Specimen: Anterior Nasal Swab  Result Value Ref Range Status   SARS Coronavirus 2 by RT PCR NEGATIVE NEGATIVE Final    Comment: (NOTE) SARS-CoV-2 target nucleic acids are NOT DETECTED.  The SARS-CoV-2 RNA is generally detectable in upper respiratory specimens during the acute phase of infection. The lowest concentration of SARS-CoV-2 viral copies this assay can detect is 138 copies/mL. A negative result does not preclude SARS-Cov-2 infection and should not be used as the sole basis for treatment or other patient management decisions. A negative result may occur with  improper specimen collection/handling, submission of specimen other than nasopharyngeal swab, presence of viral mutation(s) within the areas targeted by this assay, and inadequate number of viral copies(<138 copies/mL). A negative result must be combined with clinical observations, patient history, and epidemiological information. The expected result is Negative.  Fact Sheet for Patients:  bloggercourse.com  Fact Sheet for Healthcare Providers:  seriousbroker.it  This test is no t yet approved or cleared by the [place]  FDA and  has been authorized for detection and/or diagnosis of SARS-CoV-2 by FDA under an Emergency Use Authorization (EUA). This EUA will remain  in effect (meaning this test can be used) for the duration of the COVID-19 declaration under Section 564(b)(1) of the Act, 21 U.S.C.section 360bbb-3(b)(1), unless the authorization is terminated   or revoked sooner.       Influenza A by PCR NEGATIVE NEGATIVE Final   Influenza B by PCR NEGATIVE NEGATIVE Final    Comment: (NOTE) The Xpert Xpress SARS-CoV-2/FLU/RSV plus assay is intended as an aid in the diagnosis of influenza from Nasopharyngeal swab specimens and should not be used as a sole basis for treatment. Nasal washings and aspirates are unacceptable for Xpert Xpress SARS-CoV-2/FLU/RSV testing.  Fact Sheet for Patients: bloggercourse.com  Fact Sheet for Healthcare Providers: seriousbroker.it  This test is not yet approved or cleared by the [place]  FDA and has been authorized for detection and/or diagnosis of SARS-CoV-2 by FDA under an Emergency Use Authorization (EUA). This EUA will remain in effect (meaning this test can be used) for the duration of the COVID-19 declaration under Section 564(b)(1) of the Act, 21 U.S.C. section 360bbb-3(b)(1), unless the authorization is terminated or revoked.     Resp Syncytial Virus by PCR NEGATIVE NEGATIVE Final    Comment: (NOTE) Fact Sheet for Patients: bloggercourse.com  Fact Sheet for Healthcare Providers: seriousbroker.it  This test is not yet approved or cleared by the [place]  FDA and has been authorized for detection and/or diagnosis of SARS-CoV-2 by FDA under an Emergency Use Authorization (EUA). This EUA will remain in effect (meaning this test can be used) for the duration of the COVID-19 declaration under Section 564(b)(1) of the Act, 21 U.S.C. section 360bbb-3(b)(1), unless the authorization is terminated or revoked.  Performed at [facility], [address] [place], [place] [address]   Respiratory (~20 pathogens) panel by PCR     Status: None   Collection Time: 01/10/24 11:50 AM   Specimen: Nasopharyngeal Swab; Respiratory  Result Value Ref Range Status   Adenovirus NOT DETECTED NOT  DETECTED Final   Coronavirus 229E NOT DETECTED NOT DETECTED Final    Comment: (NOTE) The Coronavirus  on the Respiratory Panel, DOES NOT test for the novel  Coronavirus (2019 nCoV)    Coronavirus HKU1 NOT DETECTED NOT DETECTED Final   Coronavirus NL63 NOT DETECTED NOT DETECTED Final   Coronavirus OC43 NOT DETECTED NOT DETECTED Final   Metapneumovirus NOT DETECTED NOT DETECTED Final   Rhinovirus / Enterovirus NOT DETECTED NOT DETECTED Final   Influenza A NOT DETECTED NOT DETECTED Final   Influenza B NOT DETECTED NOT DETECTED Final   Parainfluenza Virus 1 NOT DETECTED NOT DETECTED Final   Parainfluenza Virus 2 NOT DETECTED NOT DETECTED Final   Parainfluenza Virus 3 NOT DETECTED NOT DETECTED Final   Parainfluenza Virus 4 NOT DETECTED NOT DETECTED Final   Respiratory Syncytial Virus NOT DETECTED NOT DETECTED Final   Bordetella pertussis NOT DETECTED NOT DETECTED Final   Bordetella Parapertussis NOT DETECTED NOT DETECTED Final   Chlamydophila pneumoniae NOT DETECTED NOT DETECTED Final   Mycoplasma pneumoniae NOT DETECTED NOT DETECTED Final    Comment: Performed at [facility], [address] [place], [place] [address]     Time coordinating discharge: 36 minutes  SIGNED:   Anthony CHRISTELLA Pouch, MD  [facility] 01/18/2024, 12:52 PM Pager   If 7PM-7AM, please contact night-coverage [email or web address]

## 2024-01-18 NOTE — TOC Transition Note (Signed)
 Transition of Care [facility]) - Discharge Note   Patient Details  Name: Morgan Jacobs MRN: [id number] Date of Birth: [birth year]  Transition of Care [facility]) CM/SW Contact:  Alvaro Louder, LCSW Phone Number: 01/18/2024, 1:42 PM   Clinical Narrative:   LCSWA received insurance approval for patient to admit to SNF [facility]. LCSWA confirmed with MD that patient is stable for discharge. LCSWA notified the patient and they are in agreement with discharge. LCSWA confirmed bed is available at SNF. Transport arranged with [facility] for next available.  RM: [phone], [phone]   TOC signing off  Final next level of care: Skilled Nursing Facility Barriers to Discharge: No Barriers Identified   Patient Goals and CMS Choice            Discharge Placement              Patient chooses bed at:  [facility]) Patient to be transferred to facility by: Daughter Name of family member notified: Self Patient and family notified of of transfer: 01/18/24  Discharge Plan and Services Additional resources added to the After Visit Summary for                                       Social Drivers of Health (SDOH) Interventions SDOH Screenings   Food Insecurity: No Food Insecurity (01/10/2024)  Housing: Unknown (01/10/2024)  Transportation Needs: No Transportation Needs (01/09/2024)  Utilities: Not At Risk (01/09/2024)  Financial Resource Strain: Low Risk  (01/10/2024)  Tobacco Use: High Risk (01/10/2024)     Readmission Risk Interventions     No data to display

## 2024-01-18 NOTE — Plan of Care (Signed)
  Problem: Education: Goal: Knowledge of General Education information will improve Description: Including pain rating scale, medication(s)/side effects and non-pharmacologic comfort measures Outcome: Progressing   Problem: Health Behavior/Discharge Planning: Goal: Ability to manage health-related needs will improve Outcome: Progressing   Problem: Clinical Measurements: Goal: Ability to maintain clinical measurements within normal limits will improve Outcome: Progressing Goal: Will remain free from infection Outcome: Progressing Goal: Diagnostic test results will improve Outcome: Progressing Goal: Respiratory complications will improve Outcome: Progressing Goal: Cardiovascular complication will be avoided Outcome: Progressing   Problem: Activity: Goal: Risk for activity intolerance will decrease Outcome: Progressing   Problem: Nutrition: Goal: Adequate nutrition will be maintained Outcome: Progressing   Problem: Coping: Goal: Level of anxiety will decrease Outcome: Progressing   Problem: Elimination: Goal: Will not experience complications related to bowel motility Outcome: Progressing Goal: Will not experience complications related to urinary retention Outcome: Progressing   Problem: Pain Managment: Goal: General experience of comfort will improve and/or be controlled Outcome: Progressing   Problem: Safety: Goal: Ability to remain free from injury will improve Outcome: Progressing   Problem: Skin Integrity: Goal: Risk for impaired skin integrity will decrease Outcome: Progressing   Problem: Education: Goal: Ability to demonstrate management of disease process will improve Outcome: Progressing Goal: Ability to verbalize understanding of medication therapies will improve Outcome: Progressing   Problem: Activity: Goal: Capacity to carry out activities will improve Outcome: Progressing   Problem: Cardiac: Goal: Ability to achieve and maintain adequate  cardiopulmonary perfusion will improve Outcome: Progressing   Problem: Education: Goal: Knowledge of disease or condition will improve Outcome: Progressing Goal: Knowledge of the prescribed therapeutic regimen will improve Outcome: Progressing   Problem: Activity: Goal: Ability to tolerate increased activity will improve Outcome: Progressing Goal: Will verbalize the importance of balancing activity with adequate rest periods Outcome: Progressing   Problem: Respiratory: Goal: Ability to maintain a clear airway will improve Outcome: Progressing Goal: Levels of oxygenation will improve Outcome: Progressing Goal: Ability to maintain adequate ventilation will improve Outcome: Progressing   Problem: Activity: Goal: Ability to tolerate increased activity will improve Outcome: Progressing   Problem: Clinical Measurements: Goal: Ability to maintain a body temperature in the normal range will improve Outcome: Progressing   Problem: Respiratory: Goal: Ability to maintain adequate ventilation will improve Outcome: Progressing Goal: Ability to maintain a clear airway will improve Outcome: Progressing

## 2024-01-21 ENCOUNTER — Observation Stay: Payer: Medicare (Managed Care) | Admitting: Family

## 2024-03-23 IMAGING — MR MR HEAD W/O CM
10 of 11 series · 43 of 48 positions shown · non-contrast
Comparison: Head CT 0046 hours and CTA head and neck 5513 hours
today.

CLINICAL DATA: 88-year-old female with altered mental status, left
gaze preference. Code stroke presentation.

EXAM:
MRI HEAD WITHOUT CONTRAST
TECHNIQUE: Multiplanar, multiecho pulse sequences of the brain and surrounding
structures were obtained without intravenous contrast.

[Series 5: DWI · axial · 3.0mm · 0.88mm/px · z∈[-60,+80]mm · 10 of 96 slices shown (1 of 4)]
[im 1/96]
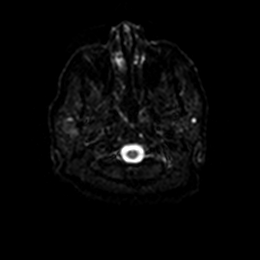
[im 11/96]
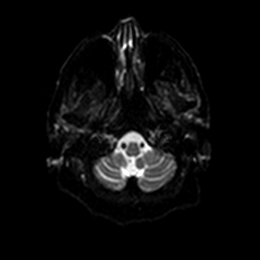
[im 22/96]
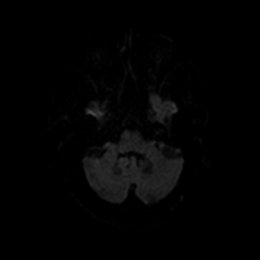
[im 32/96]
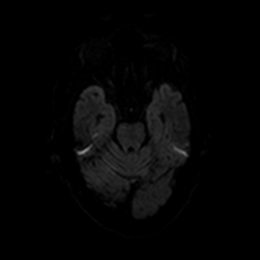
[im 43/96]
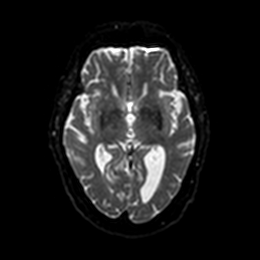
[im 53/96]
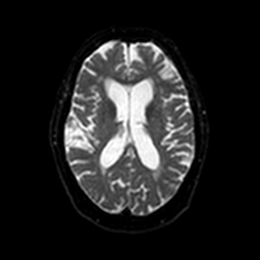
[im 64/96]
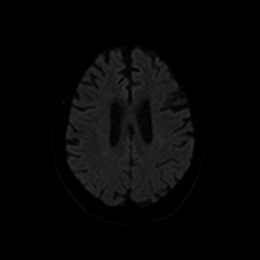
[im 74/96]
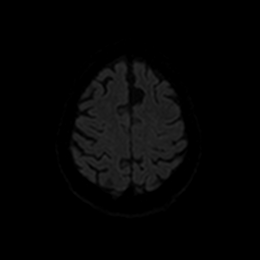
[im 85/96]
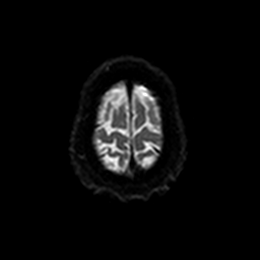
[im 96/96]
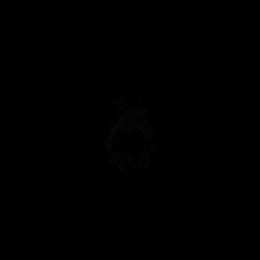

[Series 6: DWI · axial · 3.0mm · 0.88mm/px · z∈[-60,+80]mm · 4 of 48 slices shown (2 of 4)]
[im 1/48]
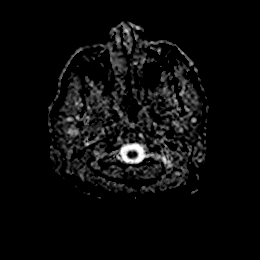
[im 16/48]
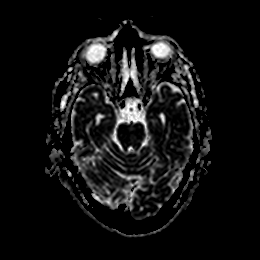
[im 32/48]
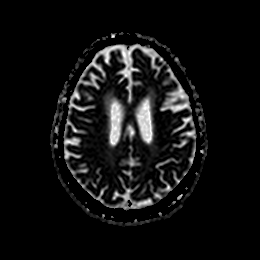
[im 48/48]
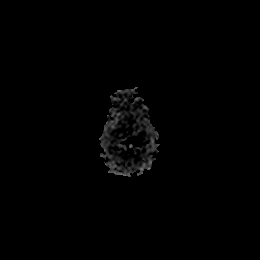

[Series 7: DWI · coronal · 4.0mm · 0.88mm/px · 6 of 64 slices shown (3 of 4)]
[im 1/64]
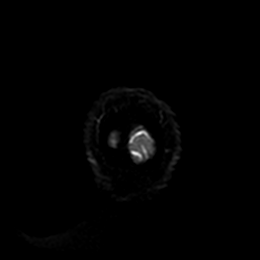
[im 13/64]
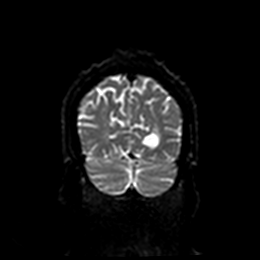
[im 26/64]
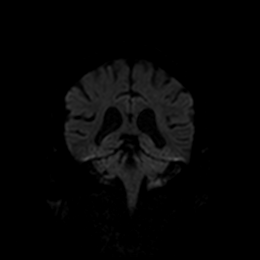
[im 38/64]
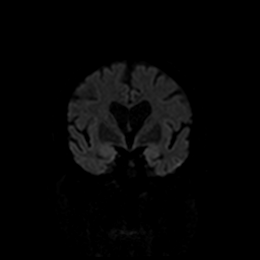
[im 51/64]
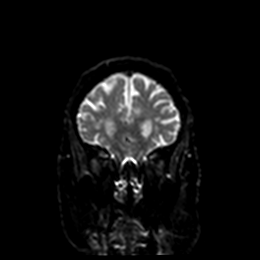
[im 64/64]
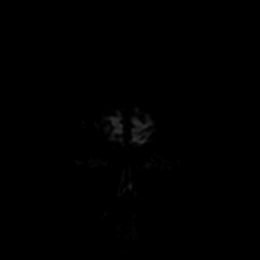

[Series 8: DWI · coronal · 4.0mm · 0.88mm/px · 3 of 32 slices shown (4 of 4)]
[im 1/32]
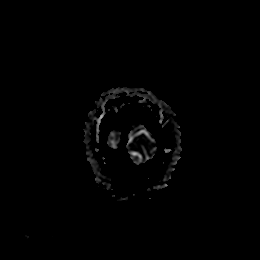
[im 16/32]
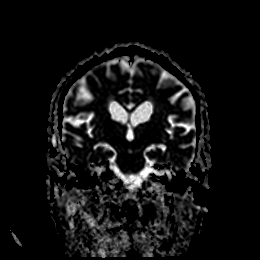
[im 32/32]
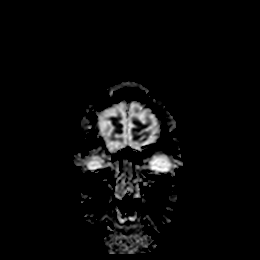

[Series 9: T1 · sagittal · 5.0mm · 0.75mm/px · 2 of 23 slices shown]
[im 1/23]
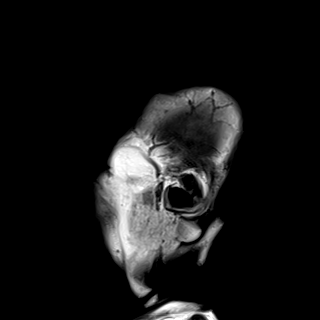
[im 23/23]
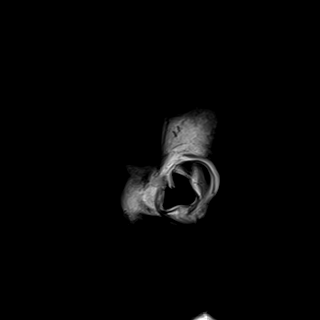

[Series 10: T2 · axial · 5.0mm · 0.72mm/px · z∈[-62,+82]mm · 2 of 25 slices shown (1 of 2)]
[im 1/25]
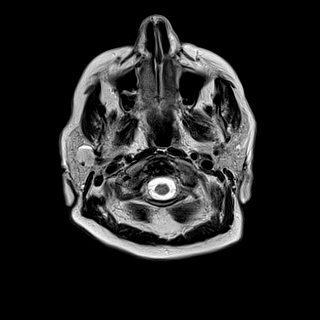
[im 25/25]
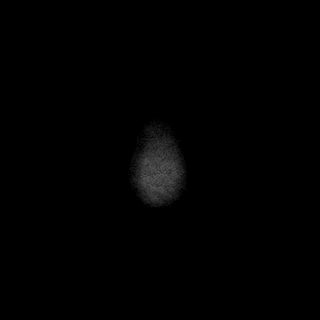

[Series 11: FLAIR · axial · 5.0mm · 0.45mm/px · z∈[-61,+83]mm · 2 of 25 slices shown]
[im 1/25]
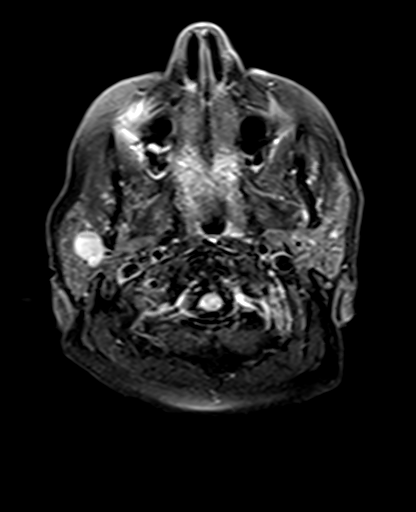
[im 25/25]
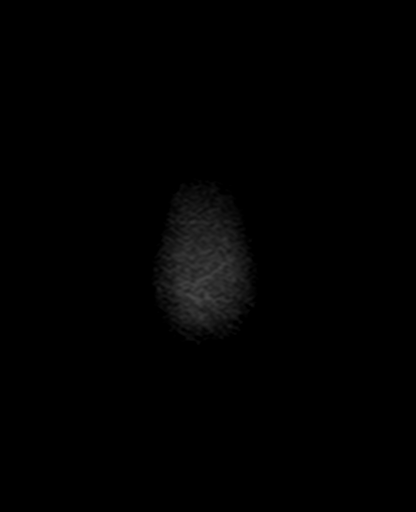

[Series 13: pha_images · axial · 3.0mm · 0.90mm/px · z∈[-77,+99]mm · 5 of 59 slices shown]
[im 1/59]
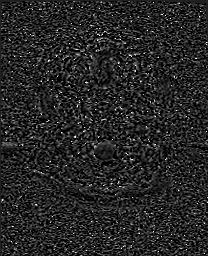
[im 15/59]
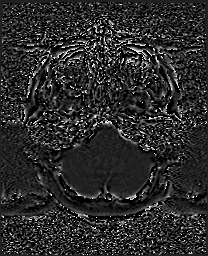
[im 30/59]
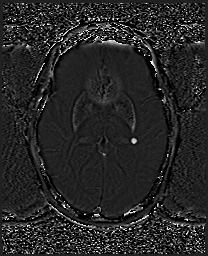
[im 44/59]
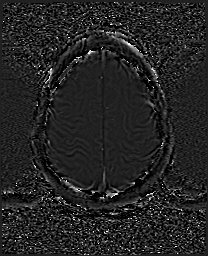
[im 59/59]
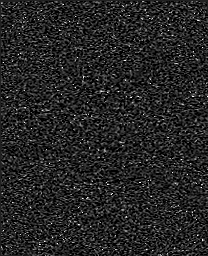

[Series 14: swi_images · axial · 3.0mm · 0.90mm/px · z∈[-77,+99]mm · 6 of 60 slices shown]
[im 1/60]
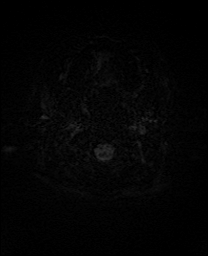
[im 12/60]
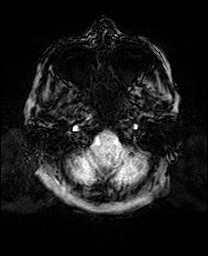
[im 24/60]
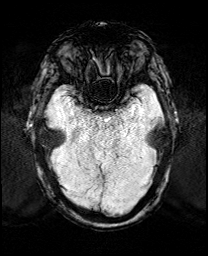
[im 36/60]
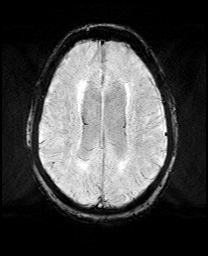
[im 48/60]
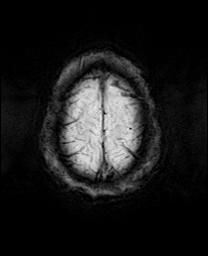
[im 60/60]
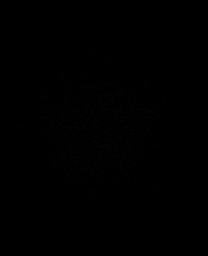

[Series 17: T2 · coronal · 5.0mm · 0.34mm/px · 3 of 29 slices shown (2 of 2)]
[im 1/29]
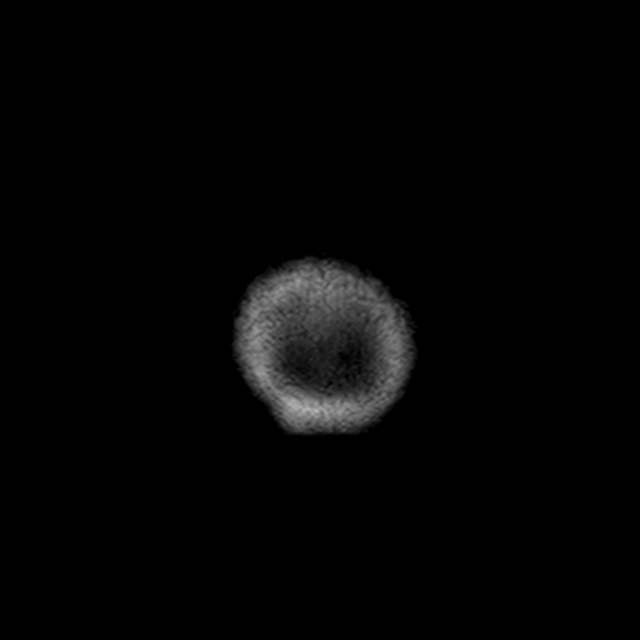
[im 15/29]
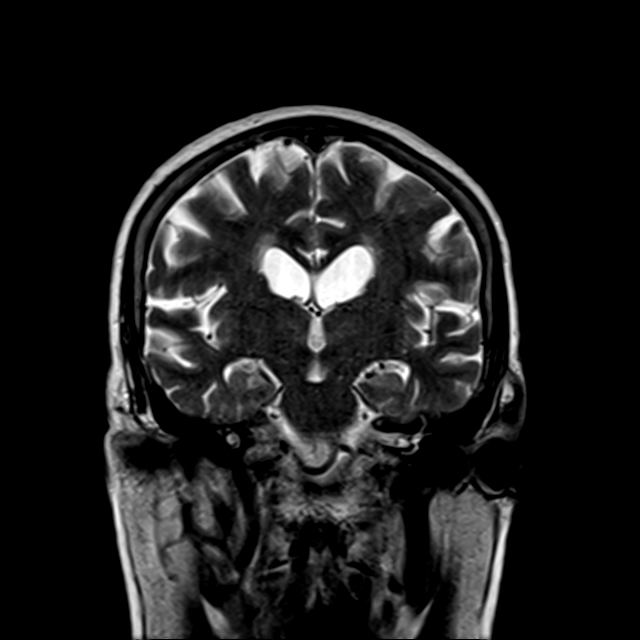
[im 29/29]
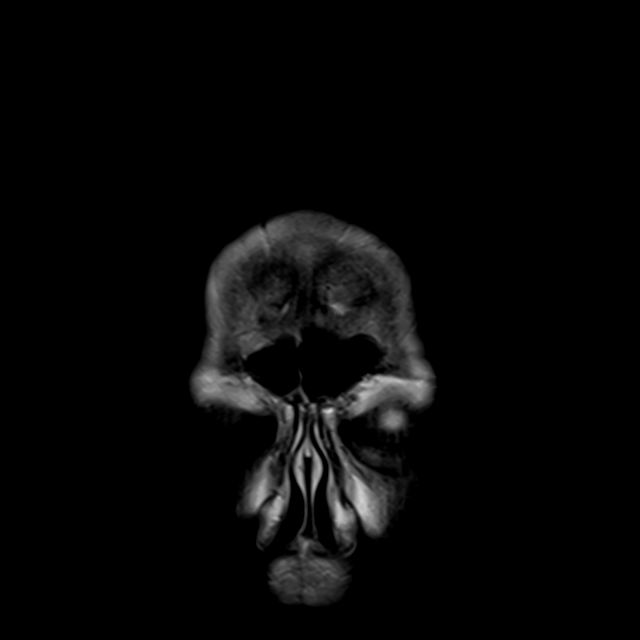

[43 of 48 positions shown; findings below may reference images not displayed]

FINDINGS: Brain: No convincing diffusion restriction.

No midline shift, mass effect, evidence of mass lesion,
ventriculomegaly, extra-axial collection or acute intracranial
hemorrhage. Cervicomedullary junction and pituitary are within
normal limits.

Scattered but generally mild for age cerebral white matter T2 and
FLAIR hyperintensity in both hemispheres. No cortical
encephalomalacia identified. Chronic microhemorrhage in the
posterior left internal capsule. But no other chronic cerebral blood
products identified. Deep gray nuclei, brainstem and cerebellum
appear within normal limits.

Vascular: Major intracranial vascular flow voids are preserved.

Skull and upper cervical spine: Widespread upper cervical spine
degeneration with at least mild multilevel spinal stenosis C3-C4 and
C4-C5 on series 9, image 11. Visualized bone marrow signal is within
normal limits.

Sinuses/Orbits: Postoperative changes to both globes, otherwise
negative.

Other: Mastoids are clear. Grossly normal visible internal auditory
structures

Right parotid space nodule as seen on CTA earlier today is primarily
T2 hyperintense, with evidence of a small fluid level (series 10,
image 1). It measures up to 2 cm long axis, with no evidence of
hypercellularity on DWI. This is most likely benign, such as parotid
benign mixed tumor (SARKAR).
IMPRESSION: 1. No convincing acute infarct or acute intracranial abnormality.
2. Largely normal for age noncontrast MRI appearance of the brain;
solitary chronic microhemorrhage in the left hemisphere and mild
nonspecific white matter signal changes.
3. Cervical spine degeneration with multilevel spinal stenosis.
4. Right parotid gland nodule seen on earlier CTA has benign MRI
characteristics.

## 2024-03-23 IMAGING — CT CT ANGIO HEAD-NECK (W OR W/O PERF)
2 of 7 series · 8 of 33 positions shown · non-contrast
Comparison: None Available.

CLINICAL DATA: Transient ischemic attack

EXAM:
CT ANGIOGRAPHY HEAD AND NECK
TECHNIQUE: Multidetector CT imaging of the head and neck was performed using
the standard protocol during bolus administration of intravenous
contrast. Multiplanar CT image reconstructions and MIPs were
obtained to evaluate the vascular anatomy. Carotid stenosis
measurements (when applicable) are obtained utilizing NASCET
criteria, using the distal internal carotid diameter as the
denominator.

[Series 5: cta neck · axial · 0.45mm/px · z∈[-152,-46]mm · 2 of 160 slices shown]
[im 54/160  soft-tissue]
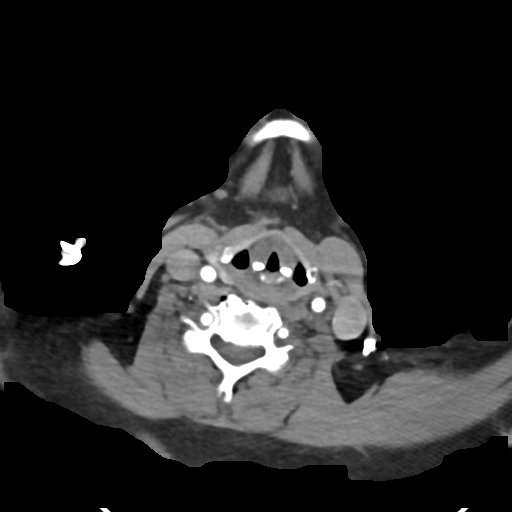
[im 107/160  soft-tissue]
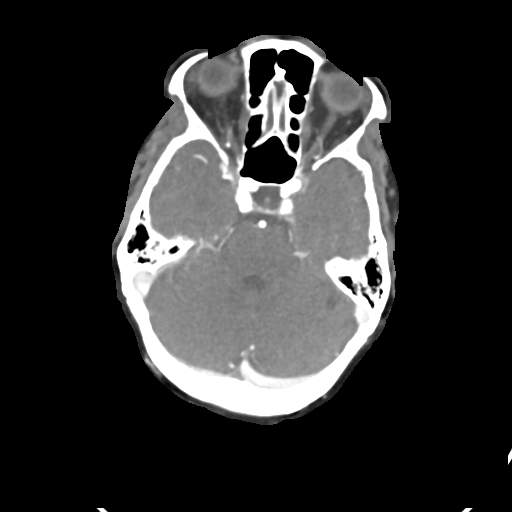

[Series 13: cta neck axial · axial · 0.37mm/px · z∈[-232,-8]mm · 6 of 319 slices shown]
[im 46/319  soft-tissue]
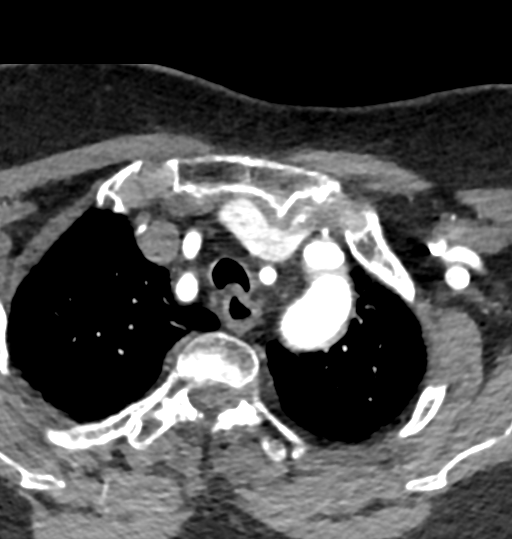
[im 91/319  bone]
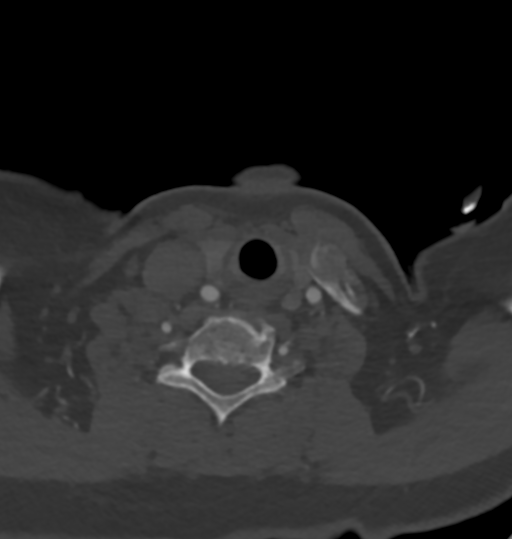
[im 137/319  soft-tissue]
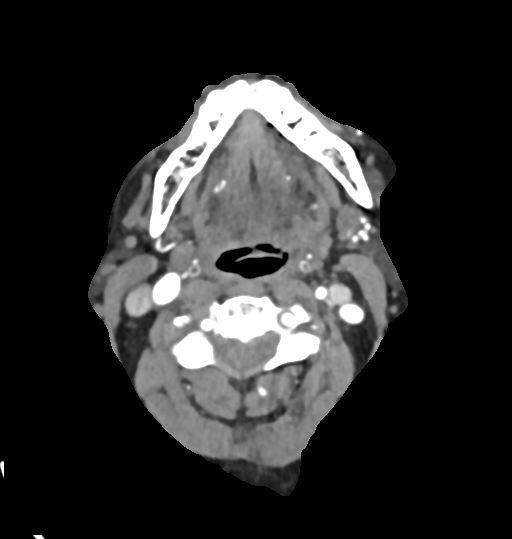
[im 182/319  bone]
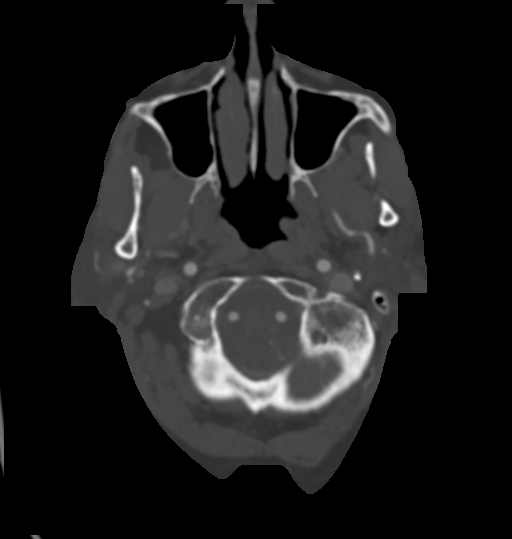
[im 228/319  soft-tissue]
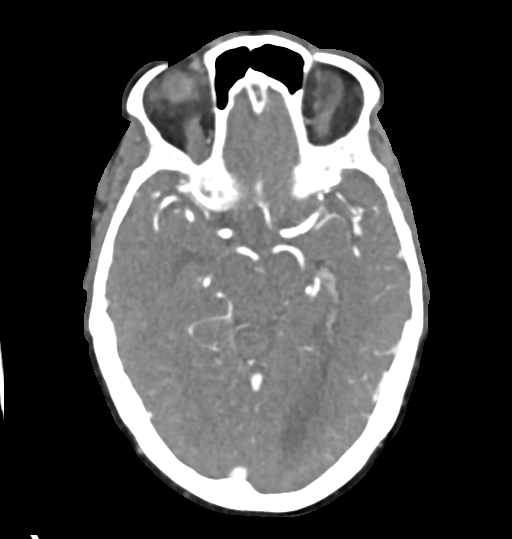
[im 273/319  bone]
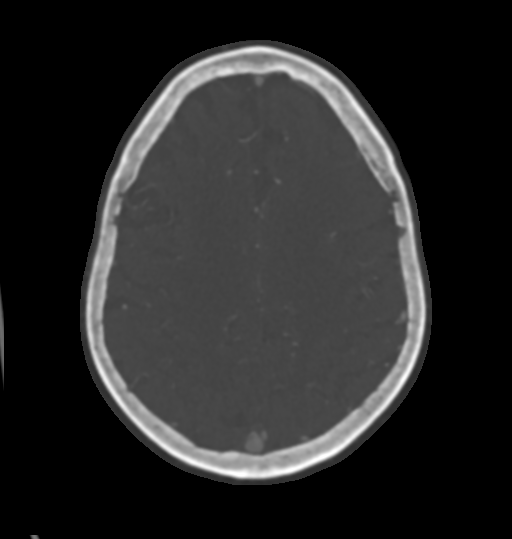

[8 of 33 positions shown; findings below may reference images not displayed]

RADIATION DOSE REDUCTION: This exam was performed according to the
departmental dose-optimization program which includes automated
exposure control, adjustment of the mA and/or kV according to
patient size and/or use of iterative reconstruction technique.

CONTRAST:  60mL OMNIPAQUE IOHEXOL 300 MG/ML  SOLN
FINDINGS: CTA NECK FINDINGS

SKELETON: There is no bony spinal canal stenosis. No lytic or
blastic lesion.

OTHER NECK: There is a right parotid mass that measures 1.4 cm.

UPPER CHEST: No pneumothorax or pleural effusion. No nodules or
masses.

AORTIC ARCH:

There is calcific atherosclerosis of the aortic arch. There is no
aneurysm, dissection or hemodynamically significant stenosis of the
visualized portion of the aorta. Conventional 3 vessel aortic
branching pattern. The visualized proximal subclavian arteries are
widely patent.

RIGHT CAROTID SYSTEM: Normal without aneurysm, dissection or
stenosis.

LEFT CAROTID SYSTEM: Normal without aneurysm, dissection or
stenosis.

VERTEBRAL ARTERIES: Left dominant configuration. Both origins are
clearly patent. There is no dissection, occlusion or flow-limiting
stenosis to the skull base (V1-V3 segments).

CTA HEAD FINDINGS

POSTERIOR CIRCULATION:

--Vertebral arteries: Normal V4 segments.

--Inferior cerebellar arteries: Normal.

--Basilar artery: Normal.

--Superior cerebellar arteries: Normal.

--Posterior cerebral arteries (PCA): Normal.

ANTERIOR CIRCULATION:

--Intracranial internal carotid arteries: Normal.

--Anterior cerebral arteries (ACA): Normal. Both A1 segments are
present. Patent anterior communicating artery (a-comm).

--Middle cerebral arteries (MCA): Normal.

VENOUS SINUSES: As permitted by contrast timing, patent.

ANATOMIC VARIANTS: None

Review of the MIP images confirms the above findings.
IMPRESSION: 1. No emergent large vessel occlusion or high-grade stenosis of the
intracranial or cervical arteries.
2. Right parotid mass that measures 1.4 cm. Histologic sampling
should be considered, as the imaging features of benign and
malignant parotid neoplasms overlap considerably.
3. Aortic Atherosclerosis (ZO648-[id number].L).

## 2024-04-01 ENCOUNTER — Other Ambulatory Visit: Payer: Self-pay

## 2024-04-01 DIAGNOSIS — G4700 Insomnia, unspecified: Secondary | ICD-10-CM | POA: Insufficient documentation

## 2024-04-01 DIAGNOSIS — I10 Essential (primary) hypertension: Secondary | ICD-10-CM | POA: Insufficient documentation

## 2024-04-01 DIAGNOSIS — E7800 Pure hypercholesterolemia, unspecified: Secondary | ICD-10-CM | POA: Insufficient documentation

## 2024-04-01 DIAGNOSIS — I2510 Atherosclerotic heart disease of native coronary artery without angina pectoris: Secondary | ICD-10-CM | POA: Insufficient documentation

## 2024-04-01 DIAGNOSIS — M109 Gout, unspecified: Secondary | ICD-10-CM | POA: Insufficient documentation

## 2024-04-01 DIAGNOSIS — S32000A Wedge compression fracture of unspecified lumbar vertebra, initial encounter for closed fracture: Secondary | ICD-10-CM | POA: Insufficient documentation

## 2024-04-01 DIAGNOSIS — K219 Gastro-esophageal reflux disease without esophagitis: Secondary | ICD-10-CM | POA: Insufficient documentation

## 2024-04-01 DIAGNOSIS — N200 Calculus of kidney: Secondary | ICD-10-CM | POA: Insufficient documentation

## 2024-04-01 DIAGNOSIS — G250 Essential tremor: Secondary | ICD-10-CM | POA: Insufficient documentation

## 2024-04-01 DIAGNOSIS — I480 Paroxysmal atrial fibrillation: Secondary | ICD-10-CM | POA: Insufficient documentation

## 2024-04-01 DIAGNOSIS — M810 Age-related osteoporosis without current pathological fracture: Secondary | ICD-10-CM | POA: Insufficient documentation

## 2024-04-01 DIAGNOSIS — K5790 Diverticulosis of intestine, part unspecified, without perforation or abscess without bleeding: Secondary | ICD-10-CM | POA: Insufficient documentation

## 2024-04-01 DIAGNOSIS — R5381 Other malaise: Secondary | ICD-10-CM | POA: Insufficient documentation

## 2024-04-01 DIAGNOSIS — I5030 Unspecified diastolic (congestive) heart failure: Secondary | ICD-10-CM | POA: Insufficient documentation

## 2024-04-01 DIAGNOSIS — J45909 Unspecified asthma, uncomplicated: Secondary | ICD-10-CM | POA: Insufficient documentation

## 2024-04-02 ENCOUNTER — Encounter: Payer: Self-pay | Admitting: Cardiology

## 2024-04-02 ENCOUNTER — Ambulatory Visit: Payer: Medicare (Managed Care) | Attending: Cardiology | Admitting: Cardiology

## 2024-04-02 VITALS — BP 132/88 | HR 74 | Ht 60.0 in | Wt 171.8 lb

## 2024-04-02 DIAGNOSIS — Z8673 Personal history of transient ischemic attack (TIA), and cerebral infarction without residual deficits: Secondary | ICD-10-CM | POA: Diagnosis not present

## 2024-04-02 DIAGNOSIS — I2510 Atherosclerotic heart disease of native coronary artery without angina pectoris: Secondary | ICD-10-CM | POA: Diagnosis not present

## 2024-04-02 DIAGNOSIS — J431 Panlobular emphysema: Secondary | ICD-10-CM

## 2024-04-02 DIAGNOSIS — I10 Essential (primary) hypertension: Secondary | ICD-10-CM

## 2024-04-02 DIAGNOSIS — Z9861 Coronary angioplasty status: Secondary | ICD-10-CM

## 2024-04-02 DIAGNOSIS — E7800 Pure hypercholesterolemia, unspecified: Secondary | ICD-10-CM

## 2024-04-02 MED ORDER — NITROGLYCERIN 0.4 MG SL SUBL: 0.4000 mg | SUBLINGUAL_TABLET | SUBLINGUAL | 6 refills | Status: AC | PRN

## 2024-04-02 NOTE — Patient Instructions (Addendum)
 Medication Instructions:  Your physician has recommended you make the following change in your medication:   Use nitroglycerin 1 tablet placed under the tongue at the first sign of chest pain or an angina attack. 1 tablet may be used every 5 minutes as needed, for up to 15 minutes. Do not take more than 3 tablets in 15 minutes. If pain persist call 911 or go to the nearest ED.  *If you need a refill on your cardiac medications before your next appointment, please call your pharmacy*   Lab Work: None ordered If you have labs (blood work) drawn today and your tests are completely normal, you will receive your results only by: MyChart Message (if you have MyChart) OR A paper copy in the mail If you have any lab test that is abnormal or we need to change your treatment, we will call you to review the results.   Testing/Procedures: None ordered   Follow-Up: At [facility], you and your health needs are our priority.  As part of our continuing mission to provide you with exceptional heart care, we have created designated Provider Care Teams.  These Care Teams include your primary Cardiologist (physician) and Advanced Practice Providers (APPs -  Physician Assistants and Nurse Practitioners) who all work together to provide you with the care you need, when you need it.  We recommend signing up for the patient portal called "MyChart".  Sign up information is provided on this After Visit Summary.  MyChart is used to connect with patients for Virtual Visits (Telemedicine).  Patients are able to view lab/test results, encounter notes, upcoming appointments, etc.  Non-urgent messages can be sent to your provider as well.   To learn more about what you can do with MyChart, go to [email or web address].    Your next appointment:   9 month(s)  The format for your next appointment:   In Person  Provider:   Belva Crome, MD    Other Instructions none  Important Information About  Sugar

## 2024-04-02 NOTE — Progress Notes (Signed)
 " Cardiology Office Note:    Date:  04/02/2024   ID:  Morgan Jacobs, DOB 1933-05-14, MRN [id number]  PCP:  Morgan Lot, PA-C  Cardiologist:  Morgan JONELLE Crape, MD   Referring MD: Morgan Lot, PA-C    ASSESSMENT:    1. Hypertension, essential, benign   2. CAD S/P proximal LAD stent (2003)   3. Primary hypertension   4. Panlobular emphysema (HCC)   5. History of TIA (transient ischemic attack)   6. Hypercholesterolemia    PLAN:    In order of problems listed above:  Coronary artery disease: Secondary prevention stressed with the patient.  Importance of compliance with diet medication stressed and patient verbalized standing. She was advised to ambulate to the best of her ability. Essential hypertension: Blood pressure is stable and diet was emphasized. Mixed dyslipidemia: Diet emphasized.  She is not keen on for lipid-lowering therapy.  I am not sure why she is not on lipid-lowering therapy.  I respect her wishes. Permanent atrial fibrillation:I discussed with the patient atrial fibrillation, disease process. Management and therapy including rate and rhythm control, anticoagulation benefits and potential risks were discussed extensively with the patient. Patient had multiple questions which were answered to patient's satisfaction. Sublingual nitroglycerin  prescription was sent, its protocol and 911 protocol explained and the patient vocalized understanding questions were answered to the patient's satisfaction Patient will be seen in follow-up appointment in 6 months or earlier if the patient has any concerns.    Medication Adjustments/Labs and Tests Ordered: Current medicines are reviewed at length with the patient today.  Concerns regarding medicines are outlined above.  Orders Placed This Encounter  Procedures   EKG 12-Lead   No orders of the defined types were placed in this encounter.    History of Present Illness:    Morgan Jacobs is a 89 y.o. female who is being seen  today for the evaluation of coronary artery disease and to be established at the request of Morgan Lot, PA-C.  Patient is a pleasant 89 year old female.  She has a past medical history of coronary artery disease, essential hypertension, mixed dyslipidemia and preserved ejection fraction heart failure.  The daughter accompanies her for the visit.  She has been seeing a cardiologist in [place].  She wants to be transferred because of insurance issues.  Daughter mentions that she is pretty noncompliant with salt intake such issues.  At that time she goes into cardiac congestive heart failure.  At the time of my evaluation, the patient is alert awake oriented and in no distress.  Past Medical History:  Diagnosis Date   (HFpEF) heart failure with preserved ejection fraction (HCC)    Acute on chronic congestive heart failure (HCC) 01/10/2024   Acute on chronic HFrEF (heart failure with reduced ejection fraction) (HCC) 01/09/2024   Acute respiratory failure with hypoxia (HCC) 01/09/2024   Ambulatory dysfunction 01/09/2024   Asthma    Benign essential tremor    CAD (coronary artery disease)    CAD S/P proximal LAD stent (2003) 01/15/2023   CAP (community acquired pneumonia) 01/09/2024   COPD (chronic obstructive pulmonary disease) (HCC)    Diverticulosis    Fatty liver    GERD (gastroesophageal reflux disease)    Gout    History of osteoporotic pathological fracture 01/09/2024   History of TIA (transient ischemic attack) 01/09/2024   HTN (hypertension)    Hypercholesterolemia    Hypertension, essential, benign    Hypokalemia    Insomnia    Intractable back pain  01/09/2024   Kidney stone    Lumbar compression fracture (HCC)    Non-traumatic compression fracture of T11 thoracic vertebra, initial encounter (HCC) 01/09/2024   Obesity, Class III, BMI 40-49.9 (morbid obesity) (HCC) 01/09/2024   Osteoporosis    Parotid mass    Paroxysmal atrial fibrillation (HCC)    On Eliquis    Paroxysmal  atrial fibrillation with RVR (HCC)    Physical deconditioning    Pulmonary hypertension (HCC) 01/09/2024   Stage 3a chronic kidney disease (CKD) (HCC) 08/07/2021   Thoracic compression fracture, closed, initial encounter (HCC) 01/10/2024   TIA (transient ischemic attack)     Past Surgical History:  Procedure Laterality Date   CATARACT EXTRACTION     heart stent     TONSILLECTOMY AND ADENOIDECTOMY      Current Medications: Active Medications[1]   Allergies:   Patient has no known allergies.   Social History   Socioeconomic History   Marital status: Widowed    Spouse name: Not on file   Number of children: 2   Years of education: 8   Highest education level: Not on file  Occupational History   Not on file  Tobacco Use   Smoking status: Former    Current packs/day: 0.00    Types: Cigarettes    Quit date: 03/20/1956    Years since quitting: 68.0   Smokeless tobacco: Current    Types: Snuff  Vaping Use   Vaping status: Never Used  Substance and Sexual Activity   Alcohol use: Not Currently   Drug use: Never   Sexual activity: Not on file  Other Topics Concern   Not on file  Social History Narrative   09/28/21 lives w/daughter most of time   Social Drivers of Health   Tobacco Use: High Risk (04/02/2024)   Patient History    Smoking Tobacco Use: Former    Smokeless Tobacco Use: Current    Passive Exposure: Not on Actuary Strain: Low Risk (01/10/2024)   Overall Financial Resource Strain (CARDIA)    Difficulty of Paying Living Expenses: Not very hard  Food Insecurity: No Food Insecurity (03/16/2024)   Received from [facility]   Epic    Within the past 12 months, you worried that your food would run out before you got the money to buy more.: Never true    Within the past 12 months, the food you bought just didn't last and you didn't have money to get more.: Never true  Transportation Needs: No Transportation Needs (03/16/2024)   Received from  [facility]   PRAPARE - Transportation    Lack of Transportation (Medical): No    Lack of Transportation (Non-Medical): No  Physical Activity: Not on file  Stress: Not on file  Social Connections: Not on file  Depression (EYV7-0): Not on file  Alcohol Screen: Not on file  Housing: Unknown (01/10/2024)   Epic    Unable to Pay for Housing in the Last Year: No    Number of Times Moved in the Last Year: Not on file    Homeless in the Last Year: No  Utilities: Low Risk (03/16/2024)   Received from [facility]   Utilities    Within the past 12 months, have you been unable to get utilities(heat, electricity) when it was really needed?: No  Health Literacy: Not on file     Family History: The patient's family history includes Cancer in her sister; Heart failure in her mother; Hypertension in  her brother, father, mother, and sister; Tremor in her mother and sister.  ROS:   Please see the history of present illness.    All other systems reviewed and are negative.  EKGs/Labs/Other Studies Reviewed:    The following studies were reviewed today:  EKG Interpretation Date/Time:  Wednesday April 02 2024 15:47:22 EST Ventricular Rate:  74 PR Interval:    QRS Duration:  70 QT Interval:  392 QTC Calculation: 435 R Axis:   90  Text Interpretation: Atrial fibrillation Rightward axis Low voltage QRS Nonspecific ST abnormality Abnormal ECG When compared with ECG of 07-Aug-2021 00:27, PREVIOUS ECG IS PRESENT Confirmed by Edwyna Backers [phone]) on 04/02/2024 3:52:36 PM     Recent Labs: 01/09/2024: ALT 41; B Natriuretic Peptide 954.9 01/18/2024: BUN 21; Creatinine, Ser 0.99; Hemoglobin 14.1; Platelets 183; Potassium 4.0; Sodium 140  Recent Lipid Panel    Component Value Date/Time   CHOL 153 08/07/2021 0241   TRIG 98 08/07/2021 0241   HDL 43 08/07/2021 0241   CHOLHDL 3.6 08/07/2021 0241   VLDL 20 08/07/2021 0241   LDLCALC 90 08/07/2021 0241    Physical Exam:    VS:  BP  132/88   Pulse 74   Ht 5' (1.524 m)   Wt 171 lb 12.8 oz (77.9 kg)   SpO2 91%   BMI 33.55 kg/m     Wt Readings from Last 3 Encounters:  04/02/24 171 lb 12.8 oz (77.9 kg)  01/18/24 175 lb 0.7 oz (79.4 kg)  10/04/21 178 lb (80.7 kg)     GEN: Patient is in no acute distress HEENT: Normal NECK: No JVD; No carotid bruits LYMPHATICS: No lymphadenopathy CARDIAC: S1 S2 regular, 2/6 systolic murmur at the apex. RESPIRATORY:  Clear to auscultation without rales, wheezing or rhonchi  ABDOMEN: Soft, non-tender, non-distended MUSCULOSKELETAL:  No edema; No deformity  SKIN: Warm and dry NEUROLOGIC:  Alert and oriented x 3 PSYCHIATRIC:  Normal affect    Signed, Backers JONELLE Edwyna, MD  04/02/2024 3:54 PM    [facility]      [1]  Current Meds  Medication Sig   albuterol  (VENTOLIN  HFA) 108 (90 Base) MCG/ACT inhaler Inhale 1-2 puffs into the lungs every 6 (six) hours as needed.   diltiazem  (DILACOR XR ) 180 MG 24 hr capsule Take 180 mg by mouth daily.   ELIQUIS  5 MG TABS tablet Take 5 mg by mouth 2 (two) times daily.   furosemide  (LASIX ) 20 MG tablet Take 20 mg by mouth daily.   loratadine (CLARITIN) 10 MG tablet Take 10 mg by mouth as needed for allergies or rhinitis.   metoprolol  succinate (TOPROL -XL) 100 MG 24 hr tablet Take 100 mg by mouth daily. Take with or immediately following a meal.   omeprazole (PRILOSEC) 20 MG capsule Take 20 mg by mouth daily as needed (reflux).   potassium chloride  (KLOR-CON ) 10 MEQ tablet Take 10 mEq by mouth daily.   traZODone (DESYREL) 50 MG tablet Take 50 mg by mouth at bedtime.   [DISCONTINUED] ezetimibe  (ZETIA ) 10 MG tablet Take 1 tablet (10 mg total) by mouth daily.   [DISCONTINUED] predniSONE  (DELTASONE ) 20 MG tablet Take 20 mg by mouth daily with breakfast.   "

## 2024-04-02 NOTE — Progress Notes (Signed)
 Eliquis  dose Age 89 Weight 77.9 kg Creatinine 0.92 03/27/24  Dose 5 mg BID
